# Patient Record
Sex: Female | Born: 1935 | Race: White | Hispanic: No
Health system: Southern US, Community
[De-identification: ages and names within clinical notes are randomized; demographics above are authoritative.]

## PROBLEM LIST (undated history)

## (undated) DIAGNOSIS — R011 Cardiac murmur, unspecified: Secondary | ICD-10-CM

## (undated) DIAGNOSIS — J45909 Unspecified asthma, uncomplicated: Secondary | ICD-10-CM

## (undated) DIAGNOSIS — Q453 Other congenital malformations of pancreas and pancreatic duct: Secondary | ICD-10-CM

## (undated) DIAGNOSIS — M810 Age-related osteoporosis without current pathological fracture: Secondary | ICD-10-CM

## (undated) DIAGNOSIS — K219 Gastro-esophageal reflux disease without esophagitis: Secondary | ICD-10-CM

## (undated) DIAGNOSIS — R112 Nausea with vomiting, unspecified: Secondary | ICD-10-CM

## (undated) DIAGNOSIS — T4145XA Adverse effect of unspecified anesthetic, initial encounter: Secondary | ICD-10-CM

## (undated) DIAGNOSIS — T8859XA Other complications of anesthesia, initial encounter: Secondary | ICD-10-CM

## (undated) DIAGNOSIS — H8110 Benign paroxysmal vertigo, unspecified ear: Secondary | ICD-10-CM

## (undated) DIAGNOSIS — E039 Hypothyroidism, unspecified: Secondary | ICD-10-CM

## (undated) DIAGNOSIS — I219 Acute myocardial infarction, unspecified: Secondary | ICD-10-CM

## (undated) DIAGNOSIS — E079 Disorder of thyroid, unspecified: Secondary | ICD-10-CM

## (undated) DIAGNOSIS — R197 Diarrhea, unspecified: Secondary | ICD-10-CM

## (undated) DIAGNOSIS — J449 Chronic obstructive pulmonary disease, unspecified: Secondary | ICD-10-CM

## (undated) DIAGNOSIS — Z9889 Other specified postprocedural states: Secondary | ICD-10-CM

## (undated) DIAGNOSIS — H269 Unspecified cataract: Secondary | ICD-10-CM

## (undated) DIAGNOSIS — R0600 Dyspnea, unspecified: Secondary | ICD-10-CM

## (undated) DIAGNOSIS — I341 Nonrheumatic mitral (valve) prolapse: Secondary | ICD-10-CM

## (undated) HISTORY — PX: OTHER SURGICAL HISTORY: SHX169

## (undated) HISTORY — PX: APPENDECTOMY: SHX54

## (undated) HISTORY — DX: Disorder of thyroid, unspecified: E07.9

## (undated) HISTORY — DX: Age-related osteoporosis without current pathological fracture: M81.0

## (undated) HISTORY — DX: Chronic obstructive pulmonary disease, unspecified: J44.9

## (undated) HISTORY — PX: LAPAROSCOPIC SALPINGO OOPHERECTOMY: SHX5927

## (undated) HISTORY — PX: TONSILLECTOMY: SUR1361

## (undated) HISTORY — DX: Diarrhea, unspecified: R19.7

## (undated) HISTORY — DX: Benign paroxysmal vertigo, unspecified ear: H81.10

## (undated) HISTORY — PX: FRACTURE SURGERY: SHX138

## (undated) HISTORY — DX: Gastro-esophageal reflux disease without esophagitis: K21.9

## (undated) HISTORY — DX: Unspecified cataract: H26.9

## (undated) HISTORY — PX: COLONOSCOPY: SHX174

## (undated) HISTORY — PX: TUBAL LIGATION: SHX77

## (undated) HISTORY — DX: Acute myocardial infarction, unspecified: I21.9

---

## 1995-04-07 DIAGNOSIS — I219 Acute myocardial infarction, unspecified: Secondary | ICD-10-CM

## 1995-04-07 HISTORY — DX: Acute myocardial infarction, unspecified: I21.9

## 1997-08-02 ENCOUNTER — Other Ambulatory Visit: Admission: RE | Admit: 1997-08-02 | Discharge: 1997-08-02 | Payer: Self-pay | Admitting: Obstetrics and Gynecology

## 1998-07-24 ENCOUNTER — Other Ambulatory Visit: Admission: RE | Admit: 1998-07-24 | Discharge: 1998-07-24 | Payer: Self-pay | Admitting: Obstetrics and Gynecology

## 1999-09-08 ENCOUNTER — Other Ambulatory Visit: Admission: RE | Admit: 1999-09-08 | Discharge: 1999-09-08 | Payer: Self-pay | Admitting: Obstetrics and Gynecology

## 2000-12-29 ENCOUNTER — Other Ambulatory Visit: Admission: RE | Admit: 2000-12-29 | Discharge: 2000-12-29 | Payer: Self-pay | Admitting: Obstetrics and Gynecology

## 2002-01-11 ENCOUNTER — Other Ambulatory Visit: Admission: RE | Admit: 2002-01-11 | Discharge: 2002-01-11 | Payer: Self-pay | Admitting: Obstetrics and Gynecology

## 2004-04-16 ENCOUNTER — Other Ambulatory Visit: Admission: RE | Admit: 2004-04-16 | Discharge: 2004-04-16 | Payer: Self-pay | Admitting: Obstetrics and Gynecology

## 2006-07-05 ENCOUNTER — Ambulatory Visit: Payer: Self-pay | Admitting: Unknown Physician Specialty

## 2008-09-13 ENCOUNTER — Ambulatory Visit: Payer: Self-pay | Admitting: Family Medicine

## 2010-03-21 ENCOUNTER — Ambulatory Visit: Payer: Self-pay | Admitting: Family Medicine

## 2012-07-21 ENCOUNTER — Ambulatory Visit (INDEPENDENT_AMBULATORY_CARE_PROVIDER_SITE_OTHER): Payer: Medicare Other | Admitting: Family Medicine

## 2012-07-21 ENCOUNTER — Encounter: Payer: Self-pay | Admitting: Family Medicine

## 2012-07-21 ENCOUNTER — Telehealth: Payer: Self-pay

## 2012-07-21 VITALS — BP 80/58 | HR 62 | Temp 99.1°F | Resp 16 | Ht 61.0 in | Wt 95.8 lb

## 2012-07-21 DIAGNOSIS — J309 Allergic rhinitis, unspecified: Secondary | ICD-10-CM

## 2012-07-21 DIAGNOSIS — E039 Hypothyroidism, unspecified: Secondary | ICD-10-CM

## 2012-07-21 DIAGNOSIS — R42 Dizziness and giddiness: Secondary | ICD-10-CM

## 2012-07-21 LAB — POCT UA - MICROSCOPIC ONLY: Crystals, Ur, HPF, POC: NEGATIVE

## 2012-07-21 LAB — POCT URINALYSIS DIPSTICK
Bilirubin, UA: NEGATIVE
Glucose, UA: NEGATIVE
Nitrite, UA: NEGATIVE
Spec Grav, UA: 1.01

## 2012-07-21 LAB — POCT CBC
Granulocyte percent: 53.8 %G (ref 37–80)
MCV: 85.9 fL (ref 80–97)
MID (cbc): 0.7 (ref 0–0.9)
MPV: 10.1 fL (ref 0–99.8)
POC Granulocyte: 4.1 (ref 2–6.9)
POC MID %: 8.7 %M (ref 0–12)
Platelet Count, POC: 313 10*3/uL (ref 142–424)
RBC: 5.03 M/uL (ref 4.04–5.48)

## 2012-07-21 LAB — GLUCOSE, POCT (MANUAL RESULT ENTRY): POC Glucose: 73 mg/dl (ref 70–99)

## 2012-07-21 MED ORDER — MECLIZINE HCL 12.5 MG PO TABS: 12.5000 mg | ORAL_TABLET | Freq: Three times a day (TID) | ORAL | Status: DC | PRN

## 2012-07-21 MED ORDER — FLUTICASONE PROPIONATE 50 MCG/ACT NA SUSP: 2.0000 | Freq: Every day | NASAL | Status: DC

## 2012-07-21 NOTE — Telephone Encounter (Signed)
Error

## 2012-07-21 NOTE — Progress Notes (Signed)
[address]   [place], [place]  [address]   [phone]  Subjective:    Patient ID: Shelia Fernandez, female    DOB: 04/05/1935, 78 y.o.   MRN: [id number]  HPI This 78 y.o. female presents for evaluation of dizziness.  Onset one week ago.  With sitting up out of bed, becomes severely dizzy.  Improves in one or two minutes after onset.  Got up to use the bathroom one week ago after dizziness resolved, everything went black so sat down in bathroom floor; may have passed out for a second.  Drinks milk and piece of chocolate with resolution of dizziness; worried about low blood sugar.  Previously evaluated by Franchot Erichsen for hypothyroidism.  Worried about low blood sugar. Last April gets this dizziness.  Allergic to oak trees; worried about inner ear due to allergies; not taking anything for allergic rhinitis; cannot take pseudoephedrine;all other allergy medications say not to take if has thyroid issues.  Thinks may be hypoglycemia.  No glucometer.  Minimal sweets; eats fruits and vegetables and proteins.  Rare pastas, breads.  Eats pastas sparingly.  Eats meat regularly.  Some nasal congestion; + rhinnorrhea; has two lobes of pancreas; both ducts are open. Denies HA, diplopia, acute vision changes, nausea, vomiting, unsteady gait, focal weakness, paresthesias, confusion, fever.  Has congenital malformation of pancreas; daughter worried about excessive insulin secretion leading to hypoglycemia.  No change in diet recently; usually eats every four hours.  No weight changes.  No medication changes.   Review of Systems  Constitutional: Negative for fever, chills, diaphoresis, activity change, appetite change, fatigue and unexpected weight change.  HENT: Positive for congestion, rhinorrhea, sneezing and postnasal drip. Negative for sore throat, drooling, mouth sores, trouble swallowing and voice change.   Eyes: Negative for photophobia and visual disturbance.  Respiratory: Positive for shortness of breath.  Negative for cough.   Cardiovascular: Positive for chest pain. Negative for palpitations and leg swelling.  Gastrointestinal: Positive for abdominal pain. Negative for nausea, vomiting, diarrhea and constipation.  Skin: Negative for color change, pallor and rash.  Neurological: Positive for dizziness, syncope and light-headedness. Negative for tremors, seizures, facial asymmetry, speech difficulty, weakness, numbness and headaches.  Psychiatric/Behavioral: Negative for confusion.        Past Medical History  Diagnosis Date  . Cataract   . COPD (chronic obstructive pulmonary disease)   . Osteoporosis   . Myocardial infarction   . Thyroid disease     Hypothyroidism    Past Surgical History  Procedure Laterality Date  . Fracture surgery    . Appendectomy    . Tubal ligation      Prior to Admission medications   Medication Sig Start Date End Date Taking? Authorizing Provider  albuterol-ipratropium (COMBIVENT) 18-103 MCG/ACT inhaler Inhale 2 puffs into the lungs every 6 (six) hours as needed for wheezing.   Yes Historical Provider, MD  thyroid (ARMOUR) 30 MG tablet Take 30 mg by mouth daily.   Yes Historical Provider, MD  fluticasone (FLONASE) 50 MCG/ACT nasal spray Place 2 sprays into the nose daily. 07/21/12   Ethelda Chick, MD  meclizine (ANTIVERT) 12.5 MG tablet Take 1 tablet (12.5 mg total) by mouth 3 (three) times daily as needed. 07/21/12   Ethelda Chick, MD    Allergies  Allergen Reactions  . Biaxin (Clarithromycin)   . Penicillins   . Sudafed (Pseudoephedrine Hcl)   . Sulfa Antibiotics     History   Social History  . Marital  Status: Married    Spouse Name: N/A    Number of Children: N/A  . Years of Education: N/A   Occupational History  . Not on file.   Social History Main Topics  . Smoking status: Never Smoker   . Smokeless tobacco: Not on file  . Alcohol Use: No  . Drug Use: No  . Sexually Active: Not on file   Other Topics Concern  . Not on file    Social History Narrative  . No narrative on file    Family History  Problem Relation Age of Onset  . Cancer Sister   . Cancer Sister     Objective:   Physical Exam  Nursing note and vitals reviewed. Constitutional: She is oriented to person, place, and time. She appears well-developed and well-nourished. No distress.  HENT:  Head: Normocephalic and atraumatic.  Right Ear: External ear normal.  Left Ear: External ear normal.  Nose: Nose normal.  Mouth/Throat: Oropharynx is clear and moist.  Eyes: Conjunctivae and EOM are normal. Pupils are equal, round, and reactive to light.  Neck: Normal range of motion. Neck supple. No JVD present. No thyromegaly present.  Cardiovascular: Normal rate, regular rhythm and normal heart sounds.  Exam reveals no gallop and no friction rub.   No murmur heard. Pulmonary/Chest: Effort normal and breath sounds normal. No respiratory distress. She has no wheezes. She has no rales.  Abdominal: Soft. Bowel sounds are normal. She exhibits no distension. There is tenderness in the left lower quadrant. There is no rebound and no guarding.  Lymphadenopathy:    She has no cervical adenopathy.  Neurological: She is alert and oriented to person, place, and time. She has normal reflexes. No cranial nerve deficit. She exhibits normal muscle tone. Coordination normal.  Acute onset of dizziness with position change (supine to sitting).  Skin: Skin is warm and dry. No rash noted. She is not diaphoretic. No erythema. No pallor.  Psychiatric: She has a normal mood and affect. Her behavior is normal. Judgment and thought content normal.   EKG: NSR; no acute changes.  Results for orders placed in visit on 07/21/12  POCT CBC      Result Value Range   WBC 7.7  4.6 - 10.2 K/uL   Lymph, poc 2.9  0.6 - 3.4   POC LYMPH PERCENT 37.5  10 - 50 %L   MID (cbc) 0.7  0 - 0.9   POC MID % 8.7  0 - 12 %M   POC Granulocyte 4.1  2 - 6.9   Granulocyte percent 53.8  37 - 80 %G    RBC 5.03  4.04 - 5.48 M/uL   Hemoglobin 13.7  12.2 - 16.2 g/dL   HCT, POC 16.1  09.6 - 47.9 %   MCV 85.9  80 - 97 fL   MCH, POC 27.2  27 - 31.2 pg   MCHC 31.7 (*) 31.8 - 35.4 g/dL   RDW, POC 04.5     Platelet Count, POC 313  142 - 424 K/uL   MPV 10.1  0 - 99.8 fL  GLUCOSE, POCT (MANUAL RESULT ENTRY)      Result Value Range   POC Glucose 73  70 - 99 mg/dl  POCT URINALYSIS DIPSTICK      Result Value Range   Color, UA yellow     Clarity, UA clear     Glucose, UA neg     Bilirubin, UA neg     Ketones, UA neg  Spec Grav, UA 1.010     Blood, UA trace     pH, UA 6.0     Protein, UA neg     Urobilinogen, UA 0.2     Nitrite, UA neg     Leukocytes, UA Trace    POCT UA - MICROSCOPIC ONLY      Result Value Range   WBC, Ur, HPF, POC 3-4     RBC, urine, microscopic 1-2     Bacteria, U Microscopic trace     Mucus, UA neg     Epithelial cells, urine per micros 0-1     Crystals, Ur, HPF, POC neg     Casts, Ur, LPF, POC neg     Yeast, UA neg         Assessment & Plan:  Dizziness - Plan: EKG 12-Lead, POCT CBC, POCT glucose (manual entry), Comprehensive metabolic panel, TSH, T4, free, Magnesium, POCT urinalysis dipstick, POCT UA - Microscopic Only  Unspecified hypothyroidism  Allergic rhinitis  1.  Dizziness:  New.  Associated with position changes.  Ddx includes vertigo, labrynthitis, orthostatic hypotension.  Obtain labs. Normal neurological exam; stable EKG.  Rx for Meclizine provided for PRN use. Treat allergic rhinitis to see if improves dizziness. Glucometer provided to check blood sugar with acute onset of dizziness; drinking milk helps with dizziness by report.  May warrant cardiology evaluation to evaluate for orthostatic hypotension in patient with chronically low blood pressure; encourage increased salt intake and increased fluid intake.  Congenital malformation of pancreas; thus, question excessive secretion of insulin leading to hypoglycemia; to check blood sugars  regularly. 2.  Hypothyroidism: stable; obtain labs.  Continue current dose of medication. 3.  Allergic Rhinitis: worsening.  Rx for Flonase provided to use daily.  Dizziness occurred same time last year; question if dizziness related to allergic rhinitis.    Meds ordered this encounter  Medications  . albuterol-ipratropium (COMBIVENT) 18-103 MCG/ACT inhaler    Sig: Inhale 2 puffs into the lungs every 6 (six) hours as needed for wheezing.  . thyroid (ARMOUR) 30 MG tablet    Sig: Take 30 mg by mouth daily.  . fluticasone (FLONASE) 50 MCG/ACT nasal spray    Sig: Place 2 sprays into the nose daily.    Dispense:  16 g    Refill:  6  . meclizine (ANTIVERT) 12.5 MG tablet    Sig: Take 1 tablet (12.5 mg total) by mouth 3 (three) times daily as needed.    Dispense:  30 tablet    Refill:  0

## 2012-07-21 NOTE — Patient Instructions (Addendum)
Dizziness - Plan: EKG 12-Lead, POCT CBC, POCT glucose (manual entry), Comprehensive metabolic panel, TSH, T4, free, Magnesium, POCT urinalysis dipstick, POCT UA - Microscopic Only, meclizine (ANTIVERT) 12.5 MG tablet  Unspecified hypothyroidism  Allergic rhinitis - Plan: fluticasone (FLONASE) 50 MCG/ACT nasal spray

## 2012-07-22 LAB — COMPREHENSIVE METABOLIC PANEL
AST: 26 U/L (ref 0–37)
Albumin: 4.5 g/dL (ref 3.5–5.2)
Alkaline Phosphatase: 62 U/L (ref 39–117)
Potassium: 4.1 mEq/L (ref 3.5–5.3)
Sodium: 140 mEq/L (ref 135–145)
Total Protein: 7.5 g/dL (ref 6.0–8.3)

## 2012-07-29 ENCOUNTER — Encounter: Payer: Self-pay | Admitting: Family Medicine

## 2012-08-12 ENCOUNTER — Telehealth: Payer: Self-pay

## 2012-08-12 MED ORDER — THYROID 30 MG PO TABS: 30.0000 mg | ORAL_TABLET | Freq: Every day | ORAL | Status: DC

## 2012-08-12 NOTE — Telephone Encounter (Signed)
[facility] is calling in regards to a refill request they faxed over for armour. They state that they were unable to send it over electronically due to issues with the original provider.  [phone]# [phone]

## 2012-08-12 NOTE — Telephone Encounter (Signed)
Have faxed for her.

## 2012-11-09 ENCOUNTER — Other Ambulatory Visit: Payer: Self-pay

## 2013-01-08 ENCOUNTER — Inpatient Hospital Stay: Payer: Self-pay | Admitting: Internal Medicine

## 2013-01-08 ENCOUNTER — Ambulatory Visit: Payer: Self-pay | Admitting: Orthopedic Surgery

## 2013-01-08 LAB — URINALYSIS, COMPLETE
Bacteria: NONE SEEN
Bilirubin,UR: NEGATIVE
Glucose,UR: NEGATIVE mg/dL (ref 0–75)
Leukocyte Esterase: NEGATIVE
Ph: 5 (ref 4.5–8.0)
Protein: NEGATIVE
RBC,UR: 1 /HPF (ref 0–5)
Specific Gravity: 1.017 (ref 1.003–1.030)
Squamous Epithelial: NONE SEEN
WBC UR: <1 /HPF (ref 0–5)

## 2013-01-08 LAB — CBC
HCT: 39.3 % (ref 35.0–47.0)
HGB: 13.1 g/dL (ref 12.0–16.0)
MCHC: 33.3 g/dL (ref 32.0–36.0)
MCV: 82 fL (ref 80–100)
Platelet: 242 10*3/uL (ref 150–440)
RBC: 4.78 10*6/uL (ref 3.80–5.20)
WBC: 10.3 10*3/uL (ref 3.6–11.0)

## 2013-01-08 LAB — APTT: Activated PTT: 29.2 secs (ref 23.6–35.9)

## 2013-01-08 LAB — COMPREHENSIVE METABOLIC PANEL
Albumin: 3.8 g/dL (ref 3.4–5.0)
Alkaline Phosphatase: 78 U/L (ref 50–136)
Bilirubin,Total: 0.4 mg/dL (ref 0.2–1.0)
Calcium, Total: 9.2 mg/dL (ref 8.5–10.1)
Co2: 25 mmol/L (ref 21–32)
Creatinine: 0.74 mg/dL (ref 0.60–1.30)
EGFR (African American): >60
Glucose: 104 mg/dL — ABNORMAL HIGH (ref 65–99)
SGOT(AST): 30 U/L (ref 15–37)
SGPT (ALT): 26 U/L (ref 12–78)

## 2013-01-08 LAB — PROTIME-INR
INR: 1
Prothrombin Time: 13.3 secs (ref 11.5–14.7)

## 2013-01-09 LAB — BASIC METABOLIC PANEL
Calcium, Total: 8.4 mg/dL — ABNORMAL LOW (ref 8.5–10.1)
Chloride: 106 mmol/L (ref 98–107)
Co2: 26 mmol/L (ref 21–32)
EGFR (African American): >60
EGFR (Non-African Amer.): >60
Glucose: 106 mg/dL — ABNORMAL HIGH (ref 65–99)
Osmolality: 278 (ref 275–301)
Potassium: 3.9 mmol/L (ref 3.5–5.1)
Sodium: 138 mmol/L (ref 136–145)

## 2013-01-09 LAB — CBC WITH DIFFERENTIAL/PLATELET
Basophil #: 0 10*3/uL (ref 0.0–0.1)
Eosinophil #: 0 10*3/uL (ref 0.0–0.7)
Eosinophil %: 0.4 %
HCT: 35.1 % (ref 35.0–47.0)
MCHC: 33.7 g/dL (ref 32.0–36.0)
MCV: 82 fL (ref 80–100)
Monocyte #: 0.7 x10 3/mm (ref 0.2–0.9)
Platelet: 198 10*3/uL (ref 150–440)
RBC: 4.28 10*6/uL (ref 3.80–5.20)
WBC: 9 10*3/uL (ref 3.6–11.0)

## 2013-01-10 LAB — BASIC METABOLIC PANEL
Anion Gap: 11 (ref 7–16)
Calcium, Total: 8.2 mg/dL — ABNORMAL LOW (ref 8.5–10.1)
Chloride: 105 mmol/L (ref 98–107)
Co2: 21 mmol/L (ref 21–32)
Creatinine: 0.71 mg/dL (ref 0.60–1.30)
Glucose: 63 mg/dL — ABNORMAL LOW (ref 65–99)
Osmolality: 272 (ref 275–301)
Potassium: 4 mmol/L (ref 3.5–5.1)

## 2013-01-10 LAB — HEMOGLOBIN: HGB: 10.8 g/dL — ABNORMAL LOW (ref 12.0–16.0)

## 2013-01-11 LAB — BASIC METABOLIC PANEL
Anion Gap: 7 (ref 7–16)
BUN: 10 mg/dL (ref 7–18)
Calcium, Total: 7.7 mg/dL — ABNORMAL LOW (ref 8.5–10.1)
Chloride: 108 mmol/L — ABNORMAL HIGH (ref 98–107)
Co2: 24 mmol/L (ref 21–32)
Creatinine: 0.64 mg/dL (ref 0.60–1.30)
Glucose: 99 mg/dL (ref 65–99)

## 2013-01-11 LAB — HEMOGLOBIN: HGB: 8.8 g/dL — ABNORMAL LOW (ref 12.0–16.0)

## 2013-01-12 LAB — CBC WITH DIFFERENTIAL/PLATELET
Basophil %: 0.6 %
Eosinophil %: 1.6 %
HGB: 9.2 g/dL — ABNORMAL LOW (ref 12.0–16.0)
Lymphocyte #: 2 10*3/uL (ref 1.0–3.6)
Lymphocyte %: 25.3 %
MCH: 28.3 pg (ref 26.0–34.0)
MCHC: 34.5 g/dL (ref 32.0–36.0)
MCV: 82 fL (ref 80–100)
Monocyte %: 11.6 %
Platelet: 176 10*3/uL (ref 150–440)
RBC: 3.24 10*6/uL — ABNORMAL LOW (ref 3.80–5.20)
RDW: 13.7 % (ref 11.5–14.5)
WBC: 8.1 10*3/uL (ref 3.6–11.0)

## 2013-01-13 ENCOUNTER — Encounter: Payer: Self-pay | Admitting: Internal Medicine

## 2013-01-23 ENCOUNTER — Ambulatory Visit: Payer: Self-pay | Admitting: Gerontology

## 2013-02-09 ENCOUNTER — Other Ambulatory Visit: Payer: Self-pay

## 2013-02-20 ENCOUNTER — Ambulatory Visit: Payer: Self-pay | Admitting: General Practice

## 2013-04-23 ENCOUNTER — Ambulatory Visit (INDEPENDENT_AMBULATORY_CARE_PROVIDER_SITE_OTHER): Payer: Medicare Other | Admitting: Family Medicine

## 2013-04-23 VITALS — BP 110/52 | HR 80 | Temp 98.5°F | Resp 16 | Ht 61.0 in | Wt 95.2 lb

## 2013-04-23 DIAGNOSIS — J449 Chronic obstructive pulmonary disease, unspecified: Secondary | ICD-10-CM

## 2013-04-23 DIAGNOSIS — M810 Age-related osteoporosis without current pathological fracture: Secondary | ICD-10-CM

## 2013-04-23 DIAGNOSIS — K219 Gastro-esophageal reflux disease without esophagitis: Secondary | ICD-10-CM

## 2013-04-23 DIAGNOSIS — E039 Hypothyroidism, unspecified: Secondary | ICD-10-CM

## 2013-04-23 LAB — POCT CBC
Granulocyte percent: 63.7 %G (ref 37–80)
HEMATOCRIT: 43.3 % (ref 37.7–47.9)
HEMOGLOBIN: 13.1 g/dL (ref 12.2–16.2)
LYMPH, POC: 1.9 (ref 0.6–3.4)
MCH: 25.6 pg — AB (ref 27–31.2)
MCHC: 30.3 g/dL — AB (ref 31.8–35.4)
MCV: 84.7 fL (ref 80–97)
MID (cbc): 0.5 (ref 0–0.9)
MPV: 10.1 fL (ref 0–99.8)
POC Granulocyte: 4.3 (ref 2–6.9)
POC LYMPH PERCENT: 28.3 %L (ref 10–50)
POC MID %: 8 %M (ref 0–12)
Platelet Count, POC: 267 10*3/uL (ref 142–424)
RBC: 5.11 M/uL (ref 4.04–5.48)
RDW, POC: 14.7 %
WBC: 6.8 10*3/uL (ref 4.6–10.2)

## 2013-04-23 MED ORDER — THYROID 30 MG PO TABS: 30.0000 mg | ORAL_TABLET | Freq: Every day | ORAL | Status: DC

## 2013-04-23 MED ORDER — PANTOPRAZOLE SODIUM 40 MG PO TBEC: 40.0000 mg | DELAYED_RELEASE_TABLET | Freq: Every day | ORAL | Status: DC

## 2013-04-23 NOTE — Progress Notes (Signed)
Subjective:    Patient ID: Shelia Fernandez, female    DOB: 10-19-1934, 79 y.o.   MRN: [id number]  HPI This chart was scribed for [name], MD by Thea Alken, ED Scribe. This patient was seen in room 5 and the patient's care was started at 3:25 PM.  HPI Comments: Shelia Fernandez is a 79 y.o. female who presents to the [facility] for a medication refill on Armour Thyroid for hypothyroidism.   Osteoporosis:  She reports she has been doing fine except for her left fractured hip. She she reports that her left hip buckled on her. Pt reports that she does not like the rod that was put in her left leg and that is sticks her. She reports the rod was put in in by Dr. Jannet Mantis.  She reports that she broke her left hip October 5th ,2014; she was hospitalized for 3 days and then went to rehab for about a month. Pt has h/o breaking her wrist in 2001 while washing dishes. She has also suffered R hip fracture in past as well.  Took Fosamax for years; also took Forteo twice for three years each time.  Osteoporosis is managed by Dr. Radene Knee of gynecology; he orders bone density scans regularly; pt has lactose intolerance.    Dizziness:  She reports that her dizziness has been cured by Dr. Delight Ovens. She states she has had dizziness her entire life and remembers as a teen her mother taking her to the doctor.   She was evaluated by Dr. Pryor Ochoa after visit at [facility] in 07/2012; dizziness continued despite PRN meclizine.  Dr. Pryor Ochoa diagnosed pt with BPV; placed patient on Eply maneuver bed in office; patient suffered with severe dizziness during procedure but dizziness then resolved.  No recurrence.  To follow-up with Dr. Pryor Ochoa PRN.  Very pleased with results.  She states her mother died 45 y.o. from cervical cancer and dad died from kidneys disease. Her father was a heavy smoker. She reports she has 4 sisters, 2 with breast cancer. She states one sister is 39 and got breast cancer in 2000, and her  other sister is 83. Neither sister have children. She has a sister with Lupus and is being seen at [facility].   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   COPD:  She has COPD and see the pulmonologist  twice a year. Pt reports she is not a smoker but her husband was a heavy smoker. She reports that asthma runs in her family and that she wheezes. She uses her inhaler about 4 times a day.  She recieved a flu shot 3 months ago. She received pnuemonia shot within 7-8 years. [place] vaccine in 2008. TDAP in 2008.   GERD: placed on Protonix during rehab; Protonix works much better for patient's intermittent reflux than Prilosec and Nexium; requesting refill of Protonix.    Past Medical History  Diagnosis Date  . Cataract   . COPD (chronic obstructive pulmonary disease)   . Osteoporosis   . Myocardial infarction   . Thyroid disease     Hypothyroidism   Past Surgical History  Procedure Laterality Date  . Appendectomy    . Tubal ligation    . Fracture surgery      R hip fracture s/p ORIF; L hip fracture s/p ORIF.  [facility] consult      +BPV;  Eply maneuvers wtih improvement.  [facility]/ENT.  . Colonoscopy      x 2; failure due to spasms.  [facility].    Allergies  Allergen Reactions  . Biaxin [Clarithromycin]   . Penicillins   . Sudafed [Pseudoephedrine Hcl]   . Sulfa Antibiotics    Prior to Admission medications   Medication Sig Start Date End Date Taking? Authorizing Provider  albuterol-ipratropium (COMBIVENT) 18-103 MCG/ACT inhaler Inhale 2 puffs into the lungs every 6 (six) hours as needed for wheezing.   Yes Historical Provider, MD  thyroid (ARMOUR) 30 MG tablet Take 1 tablet (30 mg total) by mouth daily. 08/12/12  Yes Wardell Honour, MD  fluticasone (FLONASE) 50 MCG/ACT nasal spray Place 2 sprays into the nose daily. 07/21/12   Wardell Honour, MD  meclizine (ANTIVERT) 12.5 MG tablet Take 1 tablet  (12.5 mg total) by mouth 3 (three) times daily as needed. 07/21/12   Wardell Honour, MD    Review of Systems  Constitutional: Negative for fever, chills, diaphoresis and fatigue.  Respiratory: Positive for wheezing. Negative for cough, shortness of breath and stridor.   Cardiovascular: Negative for chest pain, palpitations and leg swelling.  Gastrointestinal: Negative for nausea, vomiting, abdominal pain and diarrhea.  Endocrine: Negative for cold intolerance and heat intolerance.  Musculoskeletal: Negative for back pain, gait problem, joint swelling and myalgias.  Neurological: Negative for dizziness, syncope, speech difficulty, weakness, light-headedness, numbness and headaches.       Objective:   Physical Exam  Nursing note and vitals reviewed. Constitutional: She is oriented to person, place, and time. She appears well-developed and well-nourished. No distress.  HENT:  Head: Normocephalic and atraumatic.  Right Ear: External ear normal.  Left Ear: External ear normal.  Nose: Nose normal.  Mouth/Throat: Oropharynx is clear and moist.  Eyes: Conjunctivae and EOM are normal. Pupils are equal, round, and reactive to light.  Neck: Neck supple. No tracheal deviation present.  Cardiovascular: Normal rate, regular rhythm, normal heart sounds and intact distal pulses.  Exam reveals no gallop and no friction rub.   No murmur heard. Pulmonary/Chest: Effort normal. No respiratory distress. She has no wheezes. She has no rales.  Abdominal: Soft. Bowel sounds are normal. She exhibits no distension and no mass. There is no tenderness. There is no rebound and no guarding.  Musculoskeletal: Normal range of motion.  Lymphadenopathy:    She has no cervical adenopathy.  Neurological: She is alert and oriented to person, place, and time.  Skin: Skin is warm and dry.  Psychiatric: She has a normal mood and affect. Her behavior is normal.       Assessment & Plan:   1. Osteoporosis, unspecified     2. COPD (chronic obstructive pulmonary disease)   3. Unspecified hypothyroidism   4. Esophageal reflux    1. Osteoporosis: Persistent; s/p second hip fracture; s/p bisphosphonates x 10 years in the past; s/p Forteo in the past.  Recommend discussing Reclast with Dr. Radene Knee.   2.  Hypothyroidism:  Stable; obtain labs; refills for one year provided. 3.  GERD:  New.  Controlled with Protonix; rx provided. 4.  COPD: stable; followed by pulmonologist; s/p flu vaccine in fall.   Meds ordered this encounter  Medications  . thyroid (ARMOUR) 30 MG tablet    Sig: Take 1 tablet (30 mg total) by mouth daily.    Dispense:  90 tablet    Refill:  3  . pantoprazole (PROTONIX) 40 MG tablet  Sig: Take 1 tablet (40 mg total) by mouth daily.    Dispense:  90 tablet    Refill:  3    I personally performed the services described in this documentation, which was scribed in my presence.  The recorded information has been reviewed and is accurate.  [name], M.D.  [facility] [address] [place], [place]  [address] [phone] phone [phone] fax

## 2013-04-24 DIAGNOSIS — J449 Chronic obstructive pulmonary disease, unspecified: Secondary | ICD-10-CM | POA: Insufficient documentation

## 2013-04-24 DIAGNOSIS — M810 Age-related osteoporosis without current pathological fracture: Secondary | ICD-10-CM | POA: Insufficient documentation

## 2013-04-24 DIAGNOSIS — E039 Hypothyroidism, unspecified: Secondary | ICD-10-CM | POA: Insufficient documentation

## 2013-04-24 DIAGNOSIS — K219 Gastro-esophageal reflux disease without esophagitis: Secondary | ICD-10-CM | POA: Insufficient documentation

## 2013-04-24 LAB — T4, FREE: Free T4: 1.1 ng/dL (ref 0.80–1.80)

## 2013-04-24 LAB — TSH: TSH: 0.395 u[IU]/mL (ref 0.350–4.500)

## 2013-04-29 ENCOUNTER — Encounter: Payer: Self-pay | Admitting: Family Medicine

## 2013-09-03 ENCOUNTER — Ambulatory Visit (INDEPENDENT_AMBULATORY_CARE_PROVIDER_SITE_OTHER): Payer: Medicare Other | Admitting: Family Medicine

## 2013-09-03 VITALS — BP 98/68 | HR 77 | Temp 97.4°F | Ht 61.5 in | Wt 95.0 lb

## 2013-09-03 DIAGNOSIS — E039 Hypothyroidism, unspecified: Secondary | ICD-10-CM

## 2013-09-03 DIAGNOSIS — E876 Hypokalemia: Secondary | ICD-10-CM

## 2013-09-03 DIAGNOSIS — R5383 Other fatigue: Principal | ICD-10-CM

## 2013-09-03 DIAGNOSIS — R5381 Other malaise: Secondary | ICD-10-CM

## 2013-09-03 DIAGNOSIS — R197 Diarrhea, unspecified: Secondary | ICD-10-CM

## 2013-09-03 DIAGNOSIS — R300 Dysuria: Secondary | ICD-10-CM

## 2013-09-03 DIAGNOSIS — R1011 Right upper quadrant pain: Secondary | ICD-10-CM

## 2013-09-03 DIAGNOSIS — M7989 Other specified soft tissue disorders: Secondary | ICD-10-CM

## 2013-09-03 DIAGNOSIS — D649 Anemia, unspecified: Secondary | ICD-10-CM

## 2013-09-03 LAB — POCT CBC
Granulocyte percent: 68.1 %G (ref 37–80)
HCT, POC: 39.1 % (ref 37.7–47.9)
Hemoglobin: 12.6 g/dL (ref 12.2–16.2)
Lymph, poc: 2.3 (ref 0.6–3.4)
MCH: 27 pg (ref 27–31.2)
MCHC: 32.2 g/dL (ref 31.8–35.4)
MCV: 83.8 fL (ref 80–97)
MID (CBC): 0.7 (ref 0–0.9)
MPV: 9.1 fL (ref 0–99.8)
PLATELET COUNT, POC: 364 10*3/uL (ref 142–424)
POC Granulocyte: 6.3 (ref 2–6.9)
POC LYMPH PERCENT: 24.8 %L (ref 10–50)
POC MID %: 7.1 % (ref 0–12)
RBC: 4.66 M/uL (ref 4.04–5.48)
RDW, POC: 13.5 %
WBC: 9.2 10*3/uL (ref 4.6–10.2)

## 2013-09-03 LAB — POCT URINALYSIS DIPSTICK
Glucose, UA: NEGATIVE
KETONES UA: 15
Leukocytes, UA: NEGATIVE
Nitrite, UA: NEGATIVE
PH UA: 5.5
PROTEIN UA: NEGATIVE
SPEC GRAV UA: 1.015
Urobilinogen, UA: 0.2

## 2013-09-03 LAB — POCT UA - MICROSCOPIC ONLY
CRYSTALS, UR, HPF, POC: NEGATIVE
Casts, Ur, LPF, POC: NEGATIVE
Yeast, UA: NEGATIVE

## 2013-09-03 LAB — COMPREHENSIVE METABOLIC PANEL
ALT: 21 U/L (ref 0–35)
AST: 21 U/L (ref 0–37)
Albumin: 4.1 g/dL (ref 3.5–5.2)
Alkaline Phosphatase: 49 U/L (ref 39–117)
BILIRUBIN TOTAL: 0.5 mg/dL (ref 0.2–1.2)
BUN: 16 mg/dL (ref 6–23)
CO2: 27 meq/L (ref 19–32)
CREATININE: 0.66 mg/dL (ref 0.50–1.10)
Calcium: 9.5 mg/dL (ref 8.4–10.5)
Chloride: 100 mEq/L (ref 96–112)
Glucose, Bld: 100 mg/dL — ABNORMAL HIGH (ref 70–99)
Potassium: 4.3 mEq/L (ref 3.5–5.3)
Sodium: 137 mEq/L (ref 135–145)
Total Protein: 7.4 g/dL (ref 6.0–8.3)

## 2013-09-03 LAB — AMYLASE: Amylase: 51 U/L (ref 0–105)

## 2013-09-03 LAB — LIPASE: Lipase: 32 U/L (ref 0–75)

## 2013-09-03 LAB — TSH: TSH: 0.551 u[IU]/mL (ref 0.350–4.500)

## 2013-09-03 LAB — MAGNESIUM: MAGNESIUM: 1.9 mg/dL (ref 1.5–2.5)

## 2013-09-03 MED ORDER — POTASSIUM CHLORIDE CRYS ER 20 MEQ PO TBCR: 20.0000 meq | EXTENDED_RELEASE_TABLET | Freq: Every day | ORAL | Status: DC

## 2013-09-03 NOTE — Progress Notes (Signed)
Subjective:    Patient ID: Shelia Fernandez, female    DOB: 09-10-1934, 79 y.o.   MRN: [id number]  09/03/2013  Diarrhea   Diarrhea  Associated symptoms include abdominal pain and a fever. Pertinent negatives include no chills or vomiting.   This 79 y.o. female presents for evaluation of diarrhea.  Onset of diarrhea two weeks ago.  Evaluated by [facility] ED one week ago. S/p CT which showed diffuse infection in the colon; also diagnosed with UTI; placed on Cipro.  Felt viral etiology.  Returned on Thursday because of worsening; repeated blood work; Hgb dropped 13.0 to 11.0.  Prescribed Metronidazole but did not fill; did obtain stool studies for C. Difficile and Stool Culture; no results yet.  +fever at night; Tmax 100.8.  Fever for two weeks; +sweats.  No n/v.  +weakness.  +diarrhea has improved recently; started improving two days ago; now having 2-3 stools per day and less watery.   RUQ pain.  No diverticulitis.  Gallbladder and pancreas normal.  Potassium low. transverse colitis.  Unable to eat.  Trying to drink water; drinking 1 bottle of water per day. Has been dizzy lately for past two days. Ankles have been swelling for two months; known L atrial abnormality.  History of intermittent swelling.  Potassium 3.3 and 3.4.  Received KCl orally and iv during recent visits.  No hemoccult obtained during recent ED visit; nurse discussed hemoccult but was not obtained.  +bloated; +frequent flatus.  Pink tinged stools one week ago which prompted ED visit.  Last colonoscopy unsuccesful.   Review of Systems  Constitutional: Positive for fever and diaphoresis. Negative for chills and fatigue.  Gastrointestinal: Positive for nausea, abdominal pain, diarrhea, blood in stool and abdominal distention. Negative for vomiting and constipation.  Genitourinary: Negative for dysuria.  Neurological: Positive for dizziness, weakness and light-headedness.    Past Medical History  Diagnosis Date  . Cataract   . COPD  (chronic obstructive pulmonary disease)   . Myocardial infarction   . Thyroid disease     Hypothyroidism  . Osteoporosis     R hip fracture; L hip fracture.  Intolerance to bisphosphonates; GI upset.  Took bisphosphonates x 10 years; last bisphosphonates 2000.  s/p L wrist fracture 2001.  Marland Kitchen Benign positional vertigo     s/p ENT consultation with Eply maneuvers 2014.  [facility].  Marland Kitchen GERD (gastroesophageal reflux disease)   . Diarrhea    Past Surgical History  Procedure Laterality Date  . Appendectomy    . Tubal ligation    . Fracture surgery      R hip fracture s/p ORIF; L hip fracture s/p ORIF.  Marland Kitchen Ent consult      +BPV; Eply maneuvers wtih improvement.  [facility].  . Colonoscopy      x 2; failure due to spasms.  [facility]. [facility].    Allergies  Allergen Reactions  . Biaxin [Clarithromycin]   . Penicillins   . Sudafed [Pseudoephedrine Hcl]   . Sulfa Antibiotics    Current Outpatient Prescriptions  Medication Sig Dispense Refill  . albuterol-ipratropium (COMBIVENT) 18-103 MCG/ACT inhaler Inhale 2 puffs into the lungs every 6 (six) hours as needed for wheezing.      . pantoprazole (PROTONIX) 40 MG tablet Take 1 tablet (40 mg total) by mouth daily.  90 tablet  3  . thyroid (ARMOUR) 30 MG tablet Take 1 tablet (30 mg total) by mouth daily.  90 tablet  3  . potassium chloride SA (K-DUR,KLOR-CON) 20 MEQ  tablet Take 1 tablet (20 mEq total) by mouth daily.  7 tablet  0   No current facility-administered medications for this visit.   History   Social History  . Marital Status: Married    Spouse Name: N/A    Number of Children: N/A  . Years of Education: N/A   Occupational History  . Not on file.   Social History Main Topics  . Smoking status: Never Smoker   . Smokeless tobacco: Not on file  . Alcohol Use: No  . Drug Use: No  . Sexual Activity: Not on file   Other Topics Concern  . Not on file   Social History Narrative   Marital status: widowed x 1997 after 30+ years        Children:  1 daughter; 2 granddaughters      Lives:  With daughter Shelia Fernandez) in basement      Tobacco: never; husband smoked      Alcohol:      Exercise:         Objective:    BP 98/68  Pulse 77  Temp(Src) 97.4 F (36.3 C) (Oral)  Ht 5' 1.5" (1.562 m)  Wt 95 lb (43.092 kg)  BMI 17.66 kg/m2  SpO2 100% Physical Exam  Nursing note and vitals reviewed. Constitutional: She is oriented to person, place, and time. She appears well-developed and well-nourished. No distress.  HENT:  Head: Normocephalic and atraumatic.  Eyes: Conjunctivae are normal. Pupils are equal, round, and reactive to light.  Neck: Normal range of motion. Neck supple.  Cardiovascular: Normal rate, regular rhythm and normal heart sounds.  Exam reveals no gallop and no friction rub.   No murmur heard. Trace pitting edema B ankles only.   Pulmonary/Chest: Effort normal and breath sounds normal. She has no wheezes. She has no rales.  Abdominal: Soft. Bowel sounds are normal. She exhibits no distension and no mass. There is no hepatosplenomegaly. There is tenderness in the right upper quadrant. There is no rebound, no guarding and no CVA tenderness. No hernia.  Lymphadenopathy:    She has no cervical adenopathy.  Neurological: She is alert and oriented to person, place, and time.  Skin: She is not diaphoretic.  Psychiatric: She has a normal mood and affect. Her behavior is normal.   Results for orders placed in visit on 09/03/13  POCT UA - MICROSCOPIC ONLY      Result Value Ref Range   WBC, Ur, HPF, POC 0-3     RBC, urine, microscopic 1-5     Bacteria, U Microscopic trace     Mucus, UA large     Epithelial cells, urine per micros 0-2     Crystals, Ur, HPF, POC neg     Casts, Ur, LPF, POC neg     Yeast, UA neg    POCT URINALYSIS DIPSTICK      Result Value Ref Range   Color, UA yellow     Clarity, UA clear     Glucose, UA neg     Bilirubin, UA small     Ketones, UA 15     Spec Grav, UA 1.015      Blood, UA trace-lysed     pH, UA 5.5     Protein, UA neg     Urobilinogen, UA 0.2     Nitrite, UA neg     Leukocytes, UA Negative    POCT CBC      Result Value Ref Range   WBC 9.2  4.6 - 10.2 K/uL   Lymph, poc 2.3  0.6 - 3.4   POC LYMPH PERCENT 24.8  10 - 50 %L   MID (cbc) 0.7  0 - 0.9   POC MID % 7.1  0 - 12 %M   POC Granulocyte 6.3  2 - 6.9   Granulocyte percent 68.1  37 - 80 %G   RBC 4.66  4.04 - 5.48 M/uL   Hemoglobin 12.6  12.2 - 16.2 g/dL   HCT, POC 39.1  37.7 - 47.9 %   MCV 83.8  80 - 97 fL   MCH, POC 27.0  27 - 31.2 pg   MCHC 32.2  31.8 - 35.4 g/dL   RDW, POC 13.5     Platelet Count, POC 364  142 - 424 K/uL   MPV 9.1  0 - 99.8 fL       Assessment & Plan:  Other malaise and fatigue - Plan: POCT UA - Microscopic Only, POCT urinalysis dipstick, POCT CBC, Amylase, Lipase, Magnesium, Comprehensive metabolic panel  Dysuria - Plan: POCT UA - Microscopic Only, POCT urinalysis dipstick, POCT CBC, Amylase, Lipase, Magnesium, Comprehensive metabolic panel  Diarrhea  Abdominal pain, right upper quadrant  Hypokalemia  Anemia  Leg swelling  1. Diarrhea/colitis:  New to this provider.  S/p [facility] ED visits x 2; s/p CT abd/pelvis; stool studies obtained in ED and pending; clinically improving; s/p Cipro. Did not fill Flagyl.  BRAT diet, hydration.   2. Hypokalemia: worsening due to diarrheal illness; rx for KCL 10meq daily provided; obtain labs, Mg. 3.  Abdominal pain RUQ:  New. Associated with transverse colitis by CT scan; +diarrhea illness; no g/r.   4.  Anemia:  New; improved H/H in office today.  RTC for bloody stools. 5.  Leg swelling: chronic with recent worsening; no associated CP/SOB/orthopnea. If worsens, recommend LE dopplers and repeat echo.   Meds ordered this encounter  Medications  . potassium chloride SA (K-DUR,KLOR-CON) 20 MEQ tablet    Sig: Take 1 tablet (20 mEq total) by mouth daily.    Dispense:  7 tablet    Refill:  0    No Follow-up on  file.   [name], M.D.  [facility] [address] [place], [place]  [phone] phone [phone] fax

## 2013-09-03 NOTE — Patient Instructions (Signed)
Diarrhea Diarrhea is frequent loose and watery bowel movements. It can cause you to feel weak and dehydrated. Dehydration can cause you to become tired and thirsty, have a dry mouth, and have decreased urination that often is dark yellow. Diarrhea is a sign of another problem, most often an infection that will not last long. In most cases, diarrhea typically lasts 2 3 days. However, it can last longer if it is a sign of something more serious. It is important to treat your diarrhea as directed by your caregive to lessen or prevent future episodes of diarrhea. CAUSES  Some common causes include:  Gastrointestinal infections caused by viruses, bacteria, or parasites.  Food poisoning or food allergies.  Certain medicines, such as antibiotics, chemotherapy, and laxatives.  Artificial sweeteners and fructose.  Digestive disorders. HOME CARE INSTRUCTIONS  Ensure adequate fluid intake (hydration): have 1 cup (8 oz) of fluid for each diarrhea episode. Avoid fluids that contain simple sugars or sports drinks, fruit juices, whole milk products, and sodas. Your urine should be clear or pale yellow if you are drinking enough fluids. Hydrate with an oral rehydration solution that you can purchase at pharmacies, retail stores, and online. You can prepare an oral rehydration solution at home by mixing the following ingredients together:    tsp table salt.   tsp baking soda.   tsp salt substitute containing potassium chloride.  1  tablespoons sugar.  1 L (34 oz) of water.  Certain foods and beverages may increase the speed at which food moves through the gastrointestinal (GI) tract. These foods and beverages should be avoided and include:  Caffeinated and alcoholic beverages.  High-fiber foods, such as raw fruits and vegetables, nuts, seeds, and whole grain breads and cereals.  Foods and beverages sweetened with sugar alcohols, such as xylitol, sorbitol, and mannitol.  Some foods may be well  tolerated and may help thicken stool including:  Starchy foods, such as rice, toast, pasta, low-sugar cereal, oatmeal, grits, baked potatoes, crackers, and bagels.  Bananas.  Applesauce.  Add probiotic-rich foods to help increase healthy bacteria in the GI tract, such as yogurt and fermented milk products.  Wash your hands well after each diarrhea episode.  Only take over-the-counter or prescription medicines as directed by your caregiver.  Take a warm bath to relieve any burning or pain from frequent diarrhea episodes. SEEK IMMEDIATE MEDICAL CARE IF:   You are unable to keep fluids down.  You have persistent vomiting.  You have blood in your stool, or your stools are black and tarry.  You do not urinate in 6 8 hours, or there is only a small amount of very dark urine.  You have abdominal pain that increases or localizes.  You have weakness, dizziness, confusion, or lightheadedness.  You have a severe headache.  Your diarrhea gets worse or does not get better.  You have a fever or persistent symptoms for more than 2 3 days.  You have a fever and your symptoms suddenly get worse. MAKE SURE YOU:   Understand these instructions.  Will watch your condition.  Will get help right away if you are not doing well or get worse. Document Released: 03/13/2002 Document Revised: 03/09/2012 Document Reviewed: 11/29/2011 Exit[employer] Patient Information 2014 [employer].  

## 2013-09-07 ENCOUNTER — Encounter: Payer: Self-pay | Admitting: Family Medicine

## 2014-01-08 ENCOUNTER — Other Ambulatory Visit: Payer: Self-pay | Admitting: Family Medicine

## 2014-04-04 ENCOUNTER — Telehealth: Payer: Self-pay

## 2014-04-04 MED ORDER — THYROID 30 MG PO TABS: 30.0000 mg | ORAL_TABLET | Freq: Every day | ORAL | Status: DC

## 2014-04-04 NOTE — Telephone Encounter (Signed)
Medication has been pended

## 2014-04-04 NOTE — Telephone Encounter (Signed)
Dr Tamala Julian clinic was cancelled on 1/25.  Patient will need to have her thyroid (ARMOUR) 30 MG tablet Renewed prior to her appt on 02/08.  [facility] [place]   [phone] (H)

## 2014-04-30 ENCOUNTER — Encounter: Payer: Medicare Other | Admitting: Family Medicine

## 2014-05-14 ENCOUNTER — Encounter: Payer: Self-pay | Admitting: Family Medicine

## 2014-05-14 ENCOUNTER — Ambulatory Visit (INDEPENDENT_AMBULATORY_CARE_PROVIDER_SITE_OTHER): Payer: Medicare Other | Admitting: Family Medicine

## 2014-05-14 VITALS — BP 108/50 | HR 73 | Temp 98.0°F | Resp 16 | Ht 61.5 in | Wt 96.0 lb

## 2014-05-14 DIAGNOSIS — G4710 Hypersomnia, unspecified: Secondary | ICD-10-CM

## 2014-05-14 DIAGNOSIS — R636 Underweight: Secondary | ICD-10-CM | POA: Insufficient documentation

## 2014-05-14 DIAGNOSIS — J431 Panlobular emphysema: Secondary | ICD-10-CM

## 2014-05-14 DIAGNOSIS — K219 Gastro-esophageal reflux disease without esophagitis: Secondary | ICD-10-CM

## 2014-05-14 DIAGNOSIS — Z026 Encounter for examination for insurance purposes: Secondary | ICD-10-CM

## 2014-05-14 DIAGNOSIS — Z0000 Encounter for general adult medical examination without abnormal findings: Secondary | ICD-10-CM

## 2014-05-14 DIAGNOSIS — M810 Age-related osteoporosis without current pathological fracture: Secondary | ICD-10-CM

## 2014-05-14 DIAGNOSIS — E034 Atrophy of thyroid (acquired): Secondary | ICD-10-CM

## 2014-05-14 DIAGNOSIS — E038 Other specified hypothyroidism: Secondary | ICD-10-CM

## 2014-05-14 LAB — POCT URINALYSIS DIPSTICK
BILIRUBIN UA: NEGATIVE
Glucose, UA: NEGATIVE
Ketones, UA: NEGATIVE
Nitrite, UA: NEGATIVE
PH UA: 5.5
Protein, UA: NEGATIVE
SPEC GRAV UA: 1.015
UROBILINOGEN UA: 0.2

## 2014-05-14 MED ORDER — THYROID 30 MG PO TABS: 30.0000 mg | ORAL_TABLET | Freq: Every day | ORAL | Status: DC

## 2014-05-14 MED ORDER — IPRATROPIUM-ALBUTEROL 20-100 MCG/ACT IN AERS: 1.0000 | INHALATION_SPRAY | Freq: Four times a day (QID) | RESPIRATORY_TRACT | Status: DC

## 2014-05-14 NOTE — Patient Instructions (Signed)

## 2014-05-14 NOTE — Progress Notes (Signed)
Subjective:    Patient ID: Shelia Fernandez, female    DOB: 30-Aug-1934, 80 y.o.   MRN: [id number]  05/14/2014  Annual Exam and Medication Refill   HPI This 80 y.o. female presents for Annual Wellness Examination.  Last physical: 04-28-2013 Pap smear:07/2013 [facility] Mammogram:  07/2013 [facility]; at his office. Colonoscopy:  Never; too high risk because of unusual anatomy.  No current gastroenterologist. Completes stool kit every year with Dr. Radene Knee.  Bone density:  2013; Fosamax; Forteo x 2.   TDAP:  04/06/2006 Pneumovax:  [id number]  06/2013 Zostavax:  2009; had shingles in past. Influenza:  01/04/2014 Eye exam:  +glasses; 06/2014 scheduled; [place]in.  +early cataracts.  Has cornea problem. Dental exam:  [place] every year.   Hypothyroidism: Patient reports good compliance with medication, good tolerance to medication, and good symptom control.    COPD: followed by pulmonology yearly at [facility]; uses Combivent qid.  No history of smoking but husband was a very heavy smoker.  Osteoporosis: compliance with Calcium and vitamin d daily.  Used Fosamax for years.  S/p Forteo x 2 courses.  Not currently exercising. S/p hip fractures x 2 in past.  Last bone density two years ago.  Do not understand the benefit of obtaining bone density scan every two years.  L hip fracture:  Still limping with L leg; leg has gotten weaker recently since Christmas.  Not exercising.  [facility] has exercise room.    RUQ pain: has RUQ pain that radiates to upper back; started in twenties; follows a very strict diet.  Kept going to doctor due to pain.  Evaluated at [facility]. Dr. Vira Agar could not find course.    Benign position vertigo: recurrent dizziness for past couple of weeks.  +chronic nasal congestion; cannot tolerate antihistamines or Flonase.  Does use nasal saline spray and currently using.  Previous therapy with Dr. Pryor Ochoa with improvement in BPV.    URINARY SCREENING:  RARE URINARY LEAKAGE; NOCTURIA X 1.    Review of Systems  Constitutional: Positive for chills, activity change, appetite change and fatigue. Negative for fever, diaphoresis and unexpected weight change.  HENT: Positive for postnasal drip, sneezing and tinnitus. Negative for congestion, dental problem, drooling, ear discharge, ear pain, facial swelling, hearing loss, mouth sores, nosebleeds, rhinorrhea, sinus pressure, sore throat, trouble swallowing and voice change.   Eyes: Positive for photophobia, itching and visual disturbance. Negative for pain, discharge and redness.  Respiratory: Negative for apnea, cough, choking, chest tightness, shortness of breath, wheezing and stridor.   Cardiovascular: Negative for chest pain, palpitations and leg swelling.  Gastrointestinal: Positive for abdominal pain. Negative for nausea, vomiting, diarrhea, constipation, blood in stool, abdominal distention, anal bleeding and rectal pain.  Endocrine: Positive for cold intolerance. Negative for heat intolerance, polydipsia, polyphagia and polyuria.  Genitourinary: Negative for dysuria, urgency, frequency, hematuria, flank pain, decreased urine volume, vaginal bleeding, vaginal discharge, enuresis, difficulty urinating, genital sores, vaginal pain, menstrual problem, pelvic pain and dyspareunia.  Musculoskeletal: Positive for arthralgias and gait problem. Negative for myalgias, back pain, joint swelling, neck pain and neck stiffness.  Skin: Negative for color change, pallor, rash and wound.  Allergic/Immunologic: Positive for environmental allergies. Negative for food allergies and immunocompromised state.  Neurological: Positive for dizziness. Negative for tremors, seizures, syncope, facial asymmetry, speech difficulty, weakness, light-headedness, numbness and headaches.  Hematological: Negative for adenopathy. Does not bruise/bleed easily.  Psychiatric/Behavioral: Negative for suicidal ideas, hallucinations, behavioral problems, confusion, sleep  disturbance, self-injury, dysphoric mood, decreased concentration and agitation. The  patient is not nervous/anxious and is not hyperactive.     Past Medical History  Diagnosis Date  . Cataract   . COPD (chronic obstructive pulmonary disease)   . Myocardial infarction   . Thyroid disease     Hypothyroidism  . Osteoporosis     R hip fracture; L hip fracture.  Intolerance to bisphosphonates; GI upset.  Took bisphosphonates x 10 years; last bisphosphonates 2000.  s/p L wrist fracture 2001.  Marland Kitchen Benign positional vertigo     s/p ENT consultation with Eply maneuvers 2014.  [facility].  Marland Kitchen GERD (gastroesophageal reflux disease)   . Diarrhea    Past Surgical History  Procedure Laterality Date  . Appendectomy    . Tubal ligation    . Ent consult      +BPV; Eply maneuvers wtih improvement.  [facility].  . Colonoscopy      x 2; failure due to spasms.  [facility]. [facility].  Marland Kitchen Fracture surgery      R hip fracture s/p ORIF; L hip fracture s/p ORIF.   Allergies  Allergen Reactions  . Biaxin [Clarithromycin]   . Penicillins   . Sudafed [Pseudoephedrine Hcl]   . Sulfa Antibiotics    Current Outpatient Prescriptions  Medication Sig Dispense Refill  . albuterol-ipratropium (COMBIVENT) 18-103 MCG/ACT inhaler Inhale 2 puffs into the lungs every 6 (six) hours as needed for wheezing.    . Calcium Citrate 250 MG TABS Take by mouth.    . Cholecalciferol (VITAMIN D3) 400 UNITS CAPS Take by mouth.    . Multiple Vitamin (MULTI-VITAMINS) TABS Take by mouth.    . thyroid (ARMOUR) 30 MG tablet Take 1 tablet (30 mg total) by mouth daily. 30 tablet 11  . Ipratropium-Albuterol (COMBIVENT) 20-100 MCG/ACT AERS respimat Inhale 1 puff into the lungs every 6 (six) hours. 4 g 11   No current facility-administered medications for this visit.       Objective:    BP 108/50 mmHg  Pulse 73  Temp(Src) 98 F (36.7 C) (Oral)  Resp 16  Ht 5' 1.5" (1.562 m)  Wt 96 lb (43.545 kg)  BMI 17.85 kg/m2  SpO2  99% Physical Exam  Constitutional: She is oriented to person, place, and time. She appears well-developed and well-nourished. No distress.  HENT:  Head: Normocephalic and atraumatic.  Right Ear: External ear normal.  Left Ear: External ear normal.  Nose: Nose normal.  Mouth/Throat: Oropharynx is clear and moist.  Eyes: Conjunctivae and EOM are normal. Pupils are equal, round, and reactive to light.  Neck: Normal range of motion and full passive range of motion without pain. Neck supple. No JVD present. Carotid bruit is not present. No thyromegaly present.  Cardiovascular: Normal rate, regular rhythm and normal heart sounds.  Exam reveals no gallop and no friction rub.   No murmur heard. Pulmonary/Chest: Effort normal and breath sounds normal. She has no wheezes. She has no rales. Right breast exhibits no inverted nipple, no mass, no nipple discharge, no skin change and no tenderness. Left breast exhibits no inverted nipple, no mass, no nipple discharge, no skin change and no tenderness. Breasts are symmetrical.  Abdominal: Soft. Bowel sounds are normal. She exhibits no distension and no mass. There is no tenderness. There is no rebound and no guarding.  Genitourinary: Rectum normal.  Musculoskeletal: She exhibits no edema.       Right shoulder: Normal.       Left shoulder: Normal.       Cervical back: Normal.  Lymphadenopathy:    She has no cervical adenopathy.  Neurological: She is alert and oriented to person, place, and time. She has normal reflexes. No cranial nerve deficit. She exhibits normal muscle tone. Coordination normal.  Skin: Skin is warm and dry. No rash noted. She is not diaphoretic. No erythema. No pallor.  Psychiatric: She has a normal mood and affect. Her behavior is normal. Judgment and thought content normal.  Nursing note and vitals reviewed.  Results for orders placed or performed in visit on 09/03/13  Amylase  Result Value Ref Range   Amylase 51 0 - 105 U/L   Lipase  Result Value Ref Range   Lipase 32 0 - 75 U/L  Magnesium  Result Value Ref Range   Magnesium 1.9 1.5 - 2.5 mg/dL  Comprehensive metabolic panel  Result Value Ref Range   Sodium 137 135 - 145 mEq/L   Potassium 4.3 3.5 - 5.3 mEq/L   Chloride 100 96 - 112 mEq/L   CO2 27 19 - 32 mEq/L   Glucose, Bld 100 (H) 70 - 99 mg/dL   BUN 16 6 - 23 mg/dL   Creat 0.66 0.50 - 1.10 mg/dL   Total Bilirubin 0.5 0.2 - 1.2 mg/dL   Alkaline Phosphatase 49 39 - 117 U/L   AST 21 0 - 37 U/L   ALT 21 0 - 35 U/L   Total Protein 7.4 6.0 - 8.3 g/dL   Albumin 4.1 3.5 - 5.2 g/dL   Calcium 9.5 8.4 - 10.5 mg/dL  TSH  Result Value Ref Range   TSH 0.551 0.350 - 4.500 uIU/mL  POCT UA - Microscopic Only  Result Value Ref Range   WBC, Ur, HPF, POC 0-3    RBC, urine, microscopic 1-5    Bacteria, U Microscopic trace    Mucus, UA large    Epithelial cells, urine per micros 0-2    Crystals, Ur, HPF, POC neg    Casts, Ur, LPF, POC neg    Yeast, UA neg   POCT urinalysis dipstick  Result Value Ref Range   Color, UA yellow    Clarity, UA clear    Glucose, UA neg    Bilirubin, UA small    Ketones, UA 15    Spec Grav, UA 1.015    Blood, UA trace-lysed    pH, UA 5.5    Protein, UA neg    Urobilinogen, UA 0.2    Nitrite, UA neg    Leukocytes, UA Negative   POCT CBC  Result Value Ref Range   WBC 9.2 4.6 - 10.2 K/uL   Lymph, poc 2.3 0.6 - 3.4   POC LYMPH PERCENT 24.8 10 - 50 %L   MID (cbc) 0.7 0 - 0.9   POC MID % 7.1 0 - 12 %M   POC Granulocyte 6.3 2 - 6.9   Granulocyte percent 68.1 37 - 80 %G   RBC 4.66 4.04 - 5.48 M/uL   Hemoglobin 12.6 12.2 - 16.2 g/dL   HCT, POC 39.1 37.7 - 47.9 %   MCV 83.8 80 - 97 fL   MCH, POC 27.0 27 - 31.2 pg   MCHC 32.2 31.8 - 35.4 g/dL   RDW, POC 13.5 %   Platelet Count, POC 364 142 - 424 K/uL   MPV 9.1 0 - 99.8 fL   EKG: NSR.  No changes.    Assessment & Plan:   1. Encounter for Medicare annual wellness exam   2. Hypothyroidism due to acquired  atrophy of  thyroid   3. Osteoporosis   4. Gastroesophageal reflux disease without esophagitis   5. Panlobular emphysema   6. Hypersomnolence   7. Underweight     1.  Annual Wellness Examination and Complete Physical Examination: anticipatory guidance --- exercise, use of cane with ambulation.  No longer warrants pap smears due to age; will discuss need for yearly pelvic exam with Dr. Radene Knee.  S/p breast exam; recommend yearly mammogram.  Due for DEXA but understand that results will not change management; continue to calcium supplementation.  Immunizations UTD.  Cannot tolerate colonoscopy; performs yearly hemoccults with gynecology.  Has advanced directives; recommend bringing copy for chart; FULL CODE.  Moderate fall risk. Independent with most ADLs; excellent support from family. No hearing loss. 2. Hypothyroidism: stable; obtain labs; refill of medication provided.  Recent hypersomnolence; obtain labs. 3.  Osteoporosis: persistent; s/p 2 hip fractures warranting repair.  S/p Fosamax for years; s/p Forteo in past x 2 courses. Continue Calcium plus vitamin D.  Restart exercise program. 4.  GERD: improved; no longer warranting PPI. 5.  COPD: stable; refill of Combivent qid provided; followed by [facility]; does not desire to return unless clinically declines. 6.  Hypersomnolence: Recurrent; obtain labs, EKG.  If persists, refer back to pulmonology to evaluate for CO2 retention versus sleep disorder.  May warrant sleep study.  Recommend restarting exercise program.  Low diastolic readings chronic; encourage hydration and liberal salt intake.  Call if persists or worsens.   7. Benign position vertigo: Recurrent; recommend restarting Eply maneuvers at home; if clinically worsens, follow-up with [facility]. 8. Underweight: recommend weight gain with higher fat and higher protein food choices.  Meds ordered this encounter  Medications  . Calcium Citrate 250 MG TABS    Sig: Take by mouth.  .  Cholecalciferol (VITAMIN D3) 400 UNITS CAPS    Sig: Take by mouth.  . Multiple Vitamin (MULTI-VITAMINS) TABS    Sig: Take by mouth.  . DISCONTD: Ipratropium-Albuterol (COMBIVENT) 20-100 MCG/ACT AERS respimat    Sig: Inhale into the lungs.  Marland Kitchen thyroid (ARMOUR) 30 MG tablet    Sig: Take 1 tablet (30 mg total) by mouth daily.    Dispense:  30 tablet    Refill:  11  . Ipratropium-Albuterol (COMBIVENT) 20-100 MCG/ACT AERS respimat    Sig: Inhale 1 puff into the lungs every 6 (six) hours.    Dispense:  4 g    Refill:  11    Return in about 1 year (around 05/15/2015) for complete physical examiniation.     Kristi Elayne Guerin, M.D. [facility] [address] [place], [place]  [address] [phone] phone [phone] fax

## 2014-05-15 ENCOUNTER — Other Ambulatory Visit: Payer: Self-pay | Admitting: Radiology

## 2014-05-15 ENCOUNTER — Telehealth: Payer: Self-pay | Admitting: Radiology

## 2014-05-15 DIAGNOSIS — E034 Atrophy of thyroid (acquired): Secondary | ICD-10-CM

## 2014-05-15 LAB — CBC WITH DIFFERENTIAL/PLATELET
BASOS ABS: 0.1 10*3/uL (ref 0.0–0.1)
BASOS PCT: 1 % (ref 0–1)
EOS PCT: 2 % (ref 0–5)
Eosinophils Absolute: 0.1 10*3/uL (ref 0.0–0.7)
HEMATOCRIT: 41.5 % (ref 36.0–46.0)
Hemoglobin: 13.4 g/dL (ref 12.0–15.0)
Lymphocytes Relative: 27 % (ref 12–46)
Lymphs Abs: 1.7 10*3/uL (ref 0.7–4.0)
MCH: 27.2 pg (ref 26.0–34.0)
MCHC: 32.3 g/dL (ref 30.0–36.0)
MCV: 84.2 fL (ref 78.0–100.0)
MPV: 10.6 fL (ref 8.6–12.4)
Monocytes Absolute: 0.6 10*3/uL (ref 0.1–1.0)
Monocytes Relative: 10 % (ref 3–12)
NEUTROS PCT: 60 % (ref 43–77)
Neutro Abs: 3.8 10*3/uL (ref 1.7–7.7)
PLATELETS: 303 10*3/uL (ref 150–400)
RBC: 4.93 MIL/uL (ref 3.87–5.11)
RDW: 13.9 % (ref 11.5–15.5)
WBC: 6.4 10*3/uL (ref 4.0–10.5)

## 2014-05-15 LAB — COMPREHENSIVE METABOLIC PANEL
ALBUMIN: 4.4 g/dL (ref 3.5–5.2)
ALK PHOS: 63 U/L (ref 39–117)
ALT: 19 U/L (ref 0–35)
AST: 23 U/L (ref 0–37)
BUN: 21 mg/dL (ref 6–23)
CO2: 28 mEq/L (ref 19–32)
Calcium: 9.4 mg/dL (ref 8.4–10.5)
Chloride: 103 mEq/L (ref 96–112)
Creat: 0.57 mg/dL (ref 0.50–1.10)
GLUCOSE: 98 mg/dL (ref 70–99)
POTASSIUM: 4.3 meq/L (ref 3.5–5.3)
Sodium: 139 mEq/L (ref 135–145)
TOTAL PROTEIN: 7.2 g/dL (ref 6.0–8.3)
Total Bilirubin: 0.6 mg/dL (ref 0.2–1.2)

## 2014-05-15 LAB — T4, FREE: Free T4: 0.88 ng/dL (ref 0.80–1.80)

## 2014-05-15 LAB — TSH: TSH: 0.736 u[IU]/mL (ref 0.350–4.500)

## 2014-05-15 MED ORDER — THYROID 30 MG PO TABS: 30.0000 mg | ORAL_TABLET | Freq: Every day | ORAL | Status: DC

## 2014-05-15 NOTE — Telephone Encounter (Signed)
Patient needs Amour thyroid medication written for 90 days for insurance purpose.

## 2014-05-15 NOTE — Telephone Encounter (Signed)
Pharmacy called for patient, patient needs 90 day supply for ins purposes.  Bennett Scrape PA-C verbally approved. Ordered medication, sent refill request to [facility].  Called pharmacy to inform 90 day supply approved

## 2014-05-16 NOTE — Telephone Encounter (Signed)
This was already done on 05/15/2014 by Bennett Scrape, PA-C

## 2014-06-04 ENCOUNTER — Telehealth: Payer: Self-pay

## 2014-06-04 DIAGNOSIS — E034 Atrophy of thyroid (acquired): Secondary | ICD-10-CM

## 2014-06-04 NOTE — Telephone Encounter (Signed)
Dr. Tamala Julian, pt called about labs. Relayed lab message and sent copy to her. She is worried about her TSH and Free T4 being on the low side of normal. Wants to know what your thought are and if she adjust her Synthroid dose. Very worried about sxs of fatigue and always being cold. Please advise. Thanks

## 2014-06-05 NOTE — Telephone Encounter (Signed)
Call --- free T4 was lower limits of normal but still normal; TSH was also lower limits of normal. Both levels are very similar to levels in 08/2013 thus it would be unlikely if current thyroid levels were the cause of severe fatigue and being cold.    Has patient started exercising again to see if fatigue and cold intolerance would improve?  If yes, has it made any difference yet?  How often is she exercising and for how long?

## 2014-06-12 NOTE — Telephone Encounter (Signed)
Pt is walking 30/day qd and has been doing this since before her sxs started. Please advise. Thanks

## 2014-06-19 MED ORDER — THYROID 48.75 MG PO TABS: 48.7500 mg | ORAL_TABLET | Freq: Every day | ORAL | Status: DC

## 2014-06-19 NOTE — Telephone Encounter (Signed)
Called pt, advised message from Dr. Tamala Julian

## 2014-06-19 NOTE — Telephone Encounter (Signed)
Call --- agreeable to trial of 48.75mg  daily of Armour Thyroid.  I will send in higher dose of medication to pharmacy.  Pt will need repeat labs in 2 months.  I will have scheduling contact her for appointment.

## 2014-07-27 NOTE — Consult Note (Signed)
PATIENT NAME:  Shelia Fernandez, Shelia Fernandez MR#:  [id number] DATE OF BIRTH:  [birth year]  DATE OF CONSULTATION:  01/08/2013  REFERRING PHYSICIAN:   CONSULTING PHYSICIAN:  Maebelle Munroe, MD  CHIEF COMPLAINT: Left hip pain.   HISTORY OF PRESENT ILLNESS: Shelia Fernandez is a 80 year old female with a known history of osteoporosis and prior right femoral neck fracture, status post percutaneous screw fixation, who sustained a mechanical fall down three stairs this evening with immediate pain and inability to ambulate over the lateral aspect of the hip, complaining of sharp and burning, intermittent pain over the groin of the left hip. She denied any antecedent pain. She denied any loss of consciousness.   REVIEW OF SYSTEMS: NEUROLOGIC: No loss of consciousness.  SKIN: No laceration.  RESPIRATORY: No shortness of breath.  CARDIAC: No chest pain.  MUSCULOSKELETAL: Complains of left hip pain only.  all other review of systems are negative  PAST MEDICAL HISTORY: Remarkable for osteoporosis. Thyroid disease.  PAST SURGICAL HISTORY: Remarkable for right hip percutaneous screw fixation of femoral neck fracture.   ALLERGIES: Include BACITRACIN, PENICILLIN AND SULFA.   SOCIAL HISTORY: Lives at home with her daughter and son-in-law.  Nonsmoker, nondrinker.   PHYSICAL EXAMINATION:  GENERAL: Pleasant, alert, female appearing younger than stated age.  PSYCHIATRIC: Mood and affect appropriate.  VITAL SIGNS: On presentation to the Emergency Department, blood pressure 124/60 with heart rate of 90.  SKIN: Warm and intact over the left hip.  HEENT: Normocephalic, atraumatic. Sclerae clear. Oral mucosal membranes are slightly dry.  LUNGS: Clear to auscultation bilaterally.  HEART: Regular rate and rhythm. Normal S1 and S2.  ABDOMEN: Soft, nontender, nondistended. Positive bowel sounds.  LYMPHATIC: Mild swelling over the left lateral hip.  NEUROLOGIC: Motor and light touch sensation examination intact in the left lower  extremity.  VASCULAR: 2+ dorsalis pedis and posterior tibialis pulse.  MUSCULOSKELETAL: Bilateral upper extremities and right lower extremity all nontender to palpation. Left lower extremity is shortened and externally rotated. There is moderate tenderness to palpation over the anterolateral aspect of the left hip, at the level of the greater trochanter.  Remainder of the ipsilateral left lower extremity is nontender to palpation and without deformity.   Radiographs of the left hip and femur demonstrate a displaced, two-part intertrochanteric fracture, with extension of the fracture into the lateral wall inferiorly in a nondisplaced fashion. The fracture is externally rotated and shortened.   LABORATORY EVALUATION: Today shows chemistries are unremarkable, including excellent albumin. Hemoglobin and hematocrit 13.1 and 39.3 with platelets at 242,000. Blood type is B-positive with a negative antibody screen and coagulation parameters normal at 13, 3 and 1.   IMPRESSION: Closed, displaced left hip intertrochanteric fracture.   PLAN: Admission evaluation by the hospitalist service for medical optimization for surgical intervention. Buck's traction, 5 pounds, left lower extremity. TED and sequentials to be applied to the nonoperative leg and proceed with surgical management by Dr. Marry Guan once the patient has been medically optimized, either cephalomedullary nail construct or sliding hip screw. This was explained to the patient.  ____________________________ Maebelle Munroe, MD jfs:cg D: 01/08/2013 20:37:00 ET T: 01/09/2013 02:37:21 ET JOB#: [id number]  cc: Maebelle Munroe, MD, <Dictator> Maebelle Munroe MD ELECTRONICALLY SIGNED 01/09/2013 4:33

## 2014-07-27 NOTE — Op Note (Signed)
PATIENT NAME:  Shelia Fernandez, Shelia Fernandez MR#:  [id number] DATE OF BIRTH:  06/30/1934  DATE OF PROCEDURE:  01/09/2013  PREOPERATIVE DIAGNOSIS: Left intertrochanteric femur fracture.   POSTOPERATIVE DIAGNOSIS: Left intertrochanteric femur fracture.   PROCEDURE PERFORMED: Open reduction and internal fixation of left intertrochanteric femur fracture.   SURGEON: Laurice Record. Hooten, MD   ANESTHESIA: Spinal.   ESTIMATED BLOOD LOSS: 100 mL.   FLUIDS REPLACED: 800 mL of crystalloid.   DRAINS: None.   IMPLANTS UTILIZED: Synthes 11 mm 130 degree titanium cannulated trochanteric fixation nail, a 90 mm helical blade, and a 5 mm x 32 mm locking screw.   INDICATIONS FOR SURGERY: The patient is a 80 year old female who fell at home while coming down stairs and landed on her left hip and side. Radiographs obtained at [facility] Emergency Room demonstrated a left intertrochanteric femur fracture. After discussion of the risks and benefits of surgical intervention, the patient expressed understanding of the risks and benefits and agreed with plans for surgical intervention.   PROCEDURE IN DETAIL: The patient was brought to the Operating Room and, after adequate spinal anesthesia was achieved, the patient was placed on the fracture table. All bony prominences were well padded. Traction was applied to the left lower extremity and provisional reduction of the fracture was performed and confirmed in both AP and lateral planes using the C-arm. The patient's left hip and leg were cleaned and prepped with alcohol and DuraPrep, draped in the usual sterile fashion. A "timeout" was performed as per usual protocol.   A lateral longitudinal incision was made, extending from the proximal portion of the trochanter proximally. Fascia was incised in line with the skin incision, and the fibers of the gluteus were split in line. The tip of the greater trochanter was palpated, and a distally threaded guide pin was inserted into the proximal  intramedullary canal. Position was confirmed in both AP and lateral planes using the C-arm. The entry site was enlarged using a step drill.   An 11 mm, 130 degree trochanteric fixation nail was then advanced over the guidewire. Good position was noted using C-arm. Guidewire was removed. A second stab incision was made and a tissue protector was inserted through the outrigger device and advanced to the lateral cortex of the femur. A distally threaded guide pin was inserted into the femoral neck and head. Good position was noted in both AP and lateral planes using the C-arm.   Measurements were obtained, and it was felt that a 90 mm helical blade was appropriate length. Lateral cortex was reamed, followed by advancement of a cannulated reamer over the guidewire. A 90 mm helical blade was then advanced over the guidewire and impacted into place. Good fixation was appreciated. The locking sleeve was engaged proximally. Finally, a third stab incision was made, and a tissue protector advanced to the lateral cortex for placement of the distal locking screw. A 5.0 mm x 32 mm locking screw was then inserted. Outrigger device was removed. Good position of the implant and axillary reduction of the fracture was noted in both AP and lateral planes using the C-arm.   The wounds were irrigated with copious amounts of normal saline with antibiotic solution. Good hemostasis was noted. Fascia was reapproximated using interrupted sutures of #1 Vicryl. The subcutaneous tissue was approximated in layers using first #0 Vicryl, followed by 2-0 Vicryl. Skin was closed with skin staples. A sterile dressing was applied.   The patient tolerated the procedure well. She was transported to the  recovery room in stable condition.    ____________________________ Laurice Record. Holley Bouche., MD jph:cg D: 01/10/2013 06:18:27 ET T: 01/10/2013 06:47:41 ET JOB#: [id number]  cc: Laurice Record. Holley Bouche., MD, <Dictator> Laurice Record Holley Bouche  MD ELECTRONICALLY SIGNED 01/14/2013 15:13

## 2014-07-27 NOTE — Discharge Summary (Signed)
PATIENT NAME:  Shelia Fernandez, Shelia Fernandez MR#:  [id number] DATE OF BIRTH:  [birth year]  DATE OF ADMISSION:  01/08/2013 DATE OF DISCHARGE:  01/12/2013  DISCHARGE DIAGNOSES: 1.  Left hip fracture status post surgery.  2.  Blood loss anemia due to surgery.  Hemoglobin is stable around 9.  3.  Chronic obstructive pulmonary disease.  4.  Mitral valve prolapse.   CONDITION ON DISCHARGE: Stable.   CODE STATUS: Full code.   MEDICATIONS ON DISCHARGE: 1.  Armour Thyroid 30 mg oral once a day.  2.  ProAir 2 puffs inhalation every 6 hours as needed for wheezing.  3.  Combivent 1 puff inhalation 4 times a day.  4.  Calcium and vitamin D 500 plus 200 mg tablet 2 times a day.  5.  Lovenox 30 mg subQ injections for 14 days.  6.  Oxycodone oral tablet 1 to 2 tablets every 4 hours as needed for severe pain.  7.  Tramadol 50 mg oral tablet 1 to 2 tablets every 4 hours as needed for pain, mild to moderate.  8.  Magnesium hydroxide 30 mL 2 times a day as needed for constipation.  9.  Docusate and senna tablet 2 times a day.  10.  Pantoprazole 40 mg oral 2 times a day.   DIET ON DISCHARGE: Regular. Diet consistency: Regular.   Advised to follow within 1 to 2 weeks with Dr. Marry Guan in orthotic clinic.   HISTORY OF PRESENTING ILLNESS: A 80 year old female having mitral valve prolapse and COPD history, but no oxygen use at home. She was using her inhalers rarely, living very active and healthy lifestyle, walking 2 miles every day. She was climbing the stairs and tried to turn backward and suddenly fell down on the floor on her hip, and had a fracture on the left hip, so came to the hospital. On the 9th of October, open reduction and internal fixation of left intertrochanteric femur fracture done by Dr. Marry Guan, and she stayed for 2 postop days for further monitoring. She started tolerating physical therapy very well. Hemoglobin was 13 on admission, came down to 9 and remained stable around that level. Her respiratory  symptoms also remained under control, and we continued using inhalers. The plan is to discharge her to rehab for further management and following with orthopedics after 2 weeks.   IMPORTANT LABORATORY RESULTS IN THE HOSPITAL:  PELVIS X-RAY ON ADMISSION: Left intertrochanteric hip fracture. Urinalysis was negative on admission. Hemoglobin was 11.6, creatinine 0.54, and on the day of discharge, hemoglobin is 9.2.   CONSULT IN THE HOSPITAL: Dr. Marry Guan for orthopedic.   TOTAL TIME SPENT ON THIS DISCHARGE: 40 minutes.   ____________________________ Ceasar Lund Anselm Jungling, MD vgv:dmm D: 01/12/2013 12:00:04 ET T: 01/12/2013 12:26:31 ET JOB#: [id number]  cc: Ceasar Lund. Anselm Jungling, MD, <Dictator> [place] MD ELECTRONICALLY SIGNED 01/16/2013 23:03

## 2014-07-27 NOTE — H&P (Signed)
PATIENT NAME:  Shelia Fernandez, Shelia Fernandez MR#:  [id number] DATE OF BIRTH:  1935-03-24  DATE OF ADMISSION:  01/08/2013  PRIMARY CARE PHYSICIAN:  Dr. Marzetta Board at [place]:  ER physician, Dr. Lavonia Drafts.   CHIEF COMPLAINT: Fall and hip fracture.   HISTORY OF PRESENTING ILLNESS: A 80 year old female who is having mitral valve prolapse and COPD, but no oxygen use at home, and requiring inhalers rarely.  Is living a very active and healthy lifestyle. She is walking almost 2 miles every day without any complaint, was climbing on a stair, and tried to suddenly turn backward and go down, and fell down from 4 steps.  Fell on the floor, on her hip, and started having severe pain on left hip and found having left hip fracture in the ER. ER physician spoke to orthopedic doctor, and they suggested to admit to medicine for primary management, and orthopedic will do surgery.  REVIEW OF SYSTEMS:  CONSTITUTIONAL: Negative for fever, fatigue, weakness, but has pain in the left hip.  EYES: No blurring, double vision, pain, or redness.  EARS, NOSE, THROAT: No tinnitus, ear pain or hearing loss.  RESPIRATORY: No cough, wheezing, hemoptysis, or shortness of breath.  CARDIOVASCULAR: No chest pain, orthopnea, edema, or arrhythmia.  GASTROINTESTINAL: No nausea, vomiting, diarrhea, or abdominal pain.  GENITOURINARY: No dysuria, hematuria, or increased frequency.  ENDOCRINE: No increased sweating, heat or cold intolerance.  SKIN: No acne, rashes or lesions.  MUSCULOSKELETAL: Has pain in left knee.  NEUROLOGICAL: No numbness or weakness. Movement admitted on left lower limb because of pain but otherwise no focal abnormalities.  PSYCHIATRIC: No anxiety or insomnia, bipolar disorder.   PAST MEDICAL HISTORY: 1.  Mitral valve prolapse. 3.  COPD. No need for supplemental oxygen.  4.  Osteoporosis.   PAST SURGICAL HISTORY: Had right hip surgery in 1999, and had a removal of ovary and tubes in the past.    FAMILY HISTORY: Positive for cancer in her mother and sister. She does not know which type.  Father died of some kidney issues.   SOCIAL HISTORY: She lives with her son, and living a very active and healthy life. Denies smoking, drinking alcohol, or illegal drug use.   HOME MEDICATIONS:  She only takes some inhalers for COPD, does not remember the name, but that is as-needed basis; does not require daily basis. Takes some calcium and vitamins for her osteoporosis.   PHYSICAL EXAMINATION:  VITAL SIGNS: In the ER, temperature 98, pulse 89, respirations 18, blood pressure 130/59,  pulse oximetry 95% on room air.  GENERAL: The patient is slightly sleepy because of pain medication but easily arousable and fully oriented to time, place and person.  HEENT: Head and neck atraumatic. Conjunctiva pink. Oral mucosa moist.  NECK: Supple. No JVD.  RESPIRATORY: Bilateral clear and equal air entry.  CARDIOVASCULAR: S1, S2 present, regular. No murmur.  ABDOMEN: Soft, nontender. Bowel sounds present. No organomegaly.  SKIN: No rashes. Legs: No edema. Joints: Pain in the left hip. No other joint tenderness or swelling.  NEUROLOGICAL: Power 4/5 in all three limbs, except left lower limb. Activity is limited because of pain. No tremors or rigidity.   IMPORTANT LABORATORY RESULTS: Glucose 104, BUN 25, creatinine 0.74, sodium 139, potassium 3.9, chloride 105, CO2 of 25, calcium 9.2, total protein 7.5, albumin 3.8, bilirubin 0.4, SGOT 13, SGPT 26. WBC 10.3, hemoglobin 13.1, platelet count 242, MCV 82, INR is 1. Urine is negative for any infection.   ASSESSMENT AND PLAN: A  80 year old female who is living a very active and healthy life, had an accidental fall and developed left hip fracture.  1.  Left hip fracture. We will admit to medical service as per orthopedic request.  Orthopedic surgeon to decide about surgery plan and to manage her deep vein thrombosis prophylaxis and pain management issues. The patient was  walking almost 2 miles every day without any problem, so she is stable for the surgery with age-appropriate risk, but no other presurgical adjustment of medications needed from medical point of view. Will continue following and adjusting medications in peri and postoperative phase, as needed.  2.  History of chronic obstructive pulmonary disease. The patient was needing inhalers on as-needed basis, that was very rarely. No need of any oxygen supplementation, so currently there is no wheezing. We will give DuoNeb as needed basis.  3.  Osteoporosis. We will continue calcium and vitamin D.  4.  Mitral valve prolapse.  This issue is stable, and she was not taking any medication, and currently, there are no symptoms of heart failure or chest pain, so we will continue the same. No change.   TOTAL TIME SPENT ON THIS ADMISSION: 50 minutes   ____________________________ Ceasar Lund Anselm Jungling, MD vgv:cg D: 01/08/2013 21:00:49 ET T: 01/08/2013 23:29:13 ET JOB#: [id number]  cc: Ceasar Lund. Anselm Jungling, MD, <Dictator> Vaughan Basta MD ELECTRONICALLY SIGNED 01/16/2013 23:02

## 2014-08-03 ENCOUNTER — Emergency Department (HOSPITAL_COMMUNITY): Payer: Medicare Other

## 2014-08-03 ENCOUNTER — Emergency Department (HOSPITAL_COMMUNITY)
Admission: EM | Admit: 2014-08-03 | Discharge: 2014-08-03 | Disposition: A | Discharge status: Home / Self Care | Payer: Medicare Other | Attending: Emergency Medicine | Admitting: Emergency Medicine

## 2014-08-03 ENCOUNTER — Encounter (HOSPITAL_COMMUNITY): Payer: Self-pay | Admitting: *Deleted

## 2014-08-03 DIAGNOSIS — Z880 Allergy status to penicillin: Secondary | ICD-10-CM | POA: Insufficient documentation

## 2014-08-03 DIAGNOSIS — Z8739 Personal history of other diseases of the musculoskeletal system and connective tissue: Secondary | ICD-10-CM | POA: Diagnosis not present

## 2014-08-03 DIAGNOSIS — S299XXA Unspecified injury of thorax, initial encounter: Secondary | ICD-10-CM | POA: Insufficient documentation

## 2014-08-03 DIAGNOSIS — Z8639 Personal history of other endocrine, nutritional and metabolic disease: Secondary | ICD-10-CM | POA: Diagnosis not present

## 2014-08-03 DIAGNOSIS — Z8719 Personal history of other diseases of the digestive system: Secondary | ICD-10-CM | POA: Diagnosis not present

## 2014-08-03 DIAGNOSIS — W1839XA Other fall on same level, initial encounter: Secondary | ICD-10-CM | POA: Diagnosis not present

## 2014-08-03 DIAGNOSIS — J449 Chronic obstructive pulmonary disease, unspecified: Secondary | ICD-10-CM | POA: Diagnosis not present

## 2014-08-03 DIAGNOSIS — Y9389 Activity, other specified: Secondary | ICD-10-CM | POA: Insufficient documentation

## 2014-08-03 DIAGNOSIS — Y9289 Other specified places as the place of occurrence of the external cause: Secondary | ICD-10-CM | POA: Insufficient documentation

## 2014-08-03 DIAGNOSIS — R55 Syncope and collapse: Secondary | ICD-10-CM | POA: Diagnosis not present

## 2014-08-03 DIAGNOSIS — I252 Old myocardial infarction: Secondary | ICD-10-CM | POA: Diagnosis not present

## 2014-08-03 DIAGNOSIS — S0083XA Contusion of other part of head, initial encounter: Secondary | ICD-10-CM | POA: Diagnosis not present

## 2014-08-03 DIAGNOSIS — Y998 Other external cause status: Secondary | ICD-10-CM | POA: Diagnosis not present

## 2014-08-03 DIAGNOSIS — W19XXXA Unspecified fall, initial encounter: Secondary | ICD-10-CM

## 2014-08-03 LAB — COMPREHENSIVE METABOLIC PANEL
ALBUMIN: 4 g/dL (ref 3.5–5.2)
ALT: 20 U/L (ref 0–35)
ANION GAP: 10 (ref 5–15)
AST: 32 U/L (ref 0–37)
Alkaline Phosphatase: 59 U/L (ref 39–117)
BUN: 25 mg/dL — AB (ref 6–23)
CHLORIDE: 104 mmol/L (ref 96–112)
CO2: 24 mmol/L (ref 19–32)
Calcium: 9.7 mg/dL (ref 8.4–10.5)
Creatinine, Ser: 0.63 mg/dL (ref 0.50–1.10)
GFR, EST NON AFRICAN AMERICAN: 84 mL/min — AB (ref >90)
Glucose, Bld: 170 mg/dL — ABNORMAL HIGH (ref 70–99)
POTASSIUM: 4.1 mmol/L (ref 3.5–5.1)
SODIUM: 138 mmol/L (ref 135–145)
Total Bilirubin: 0.7 mg/dL (ref 0.3–1.2)
Total Protein: 7.5 g/dL (ref 6.0–8.3)

## 2014-08-03 LAB — CBC
HCT: 41.6 % (ref 36.0–46.0)
Hemoglobin: 13.8 g/dL (ref 12.0–15.0)
MCH: 28 pg (ref 26.0–34.0)
MCHC: 33.2 g/dL (ref 30.0–36.0)
MCV: 84.4 fL (ref 78.0–100.0)
Platelets: 155 10*3/uL (ref 150–400)
RBC: 4.93 MIL/uL (ref 3.87–5.11)
RDW: 13.6 % (ref 11.5–15.5)
WBC: 8.2 10*3/uL (ref 4.0–10.5)

## 2014-08-03 LAB — URINALYSIS, ROUTINE W REFLEX MICROSCOPIC
Bilirubin Urine: NEGATIVE
GLUCOSE, UA: NEGATIVE mg/dL
Hgb urine dipstick: NEGATIVE
KETONES UR: NEGATIVE mg/dL
Nitrite: NEGATIVE
Protein, ur: NEGATIVE mg/dL
SPECIFIC GRAVITY, URINE: 1.013 (ref 1.005–1.030)
Urobilinogen, UA: 0.2 mg/dL (ref 0.0–1.0)
pH: 5.5 (ref 5.0–8.0)

## 2014-08-03 LAB — URINE MICROSCOPIC-ADD ON

## 2014-08-03 LAB — CBG MONITORING, ED: GLUCOSE-CAPILLARY: 178 mg/dL — AB (ref 70–99)

## 2014-08-03 LAB — TSH: TSH: 0.088 u[IU]/mL — AB (ref 0.350–4.500)

## 2014-08-03 LAB — I-STAT TROPONIN, ED: Troponin i, poc: 0 ng/mL (ref 0.00–0.08)

## 2014-08-03 MED ORDER — SODIUM CHLORIDE 0.9 % IV BOLUS (SEPSIS): 1000.0000 mL | Freq: Once | INTRAVENOUS | Status: DC

## 2014-08-03 NOTE — ED Notes (Signed)
Pt states she was standing talking on the phone and she lost consciousness.  Does not know how long she fell.  Small hematoma to L occiput.  Pt states that 2 hours after she passed out she took and shower and almost passed out again.  Now c/o chest pain on exertion.

## 2014-08-03 NOTE — ED Provider Notes (Signed)
CSN: [id number]     Arrival date & time 08/03/14  1323 History   First MD Initiated Contact with Patient 08/03/14 1526     Chief Complaint  Patient presents with  . Loss of Consciousness     (Consider location/radiation/quality/duration/timing/severity/associated sxs/prior Treatment) The history is provided by the patient and medical records.    This is a 80 y.o. F with PMH significant for COPD, MI, hypothyroidism, osteoporosis, BPV, GERD, diarrhea, presenting to the ED for loss of consciousness.  Patient states earlier today she standing in her home and talking on the phone when the room got dark, she got stars, and felt as though she was going to pass out.  States she woke up on the floor, unsure how long she was unconscious.  She did notice a "goose egg" to the back of her head.  About 2 hours after this patient went to take a shower and had near syncopal event, but did not fully pass out this time.  States since this time she has had some heaviness in her chest associated with SOB.  She states it feels like she needs to "cough some congestion out".  Recent nasal congestion without fever, chills, sweats.  She denies current diaphoresis, nausea, vomiting or weakness.  States does have hx of syncope in the past and was evaluated with negative work-up.  She denies recent illness, fever, chills, sweats.  Hx of vertigo but denies current dizziness or lightheadedness.  She does report headache and sacral pain.  No visual disturbance, tinnitus, confusion, changes in speech, numbness, paresthesias, or focal weakness.  Patient not currently on anti-coagulation.  Has ambulated independently since fall without issue.  Doctor adjusted her thyroid medication approx 2 months ago, no other medication changes.  VSS.  Past Medical History  Diagnosis Date  . Cataract   . COPD (chronic obstructive pulmonary disease)   . Myocardial infarction   . Thyroid disease     Hypothyroidism  . Osteoporosis     R hip  fracture; L hip fracture.  Intolerance to bisphosphonates; GI upset.  Took bisphosphonates x 10 years; last bisphosphonates 2000.  s/p L wrist fracture 2001.  Marland Kitchen Benign positional vertigo     s/p ENT consultation with Eply maneuvers 2014.  [facility].  Marland Kitchen GERD (gastroesophageal reflux disease)   . Diarrhea    Past Surgical History  Procedure Laterality Date  . Appendectomy    . Tubal ligation    . Ent consult      +BPV; Eply maneuvers wtih improvement.  [facility]/ENT.  . Colonoscopy      x 2; failure due to spasms.  [facility]. [facility].  Marland Kitchen Fracture surgery      R hip fracture s/p ORIF; L hip fracture s/p ORIF.   Family History  Problem Relation Age of Onset  . Cancer Sister     Breast cancer; endometrial cancer; vaginal cancer  . COPD Sister   . Cancer Sister     Breast cancer  . Cancer Mother 9    cervical cancer  . COPD Father   . Cancer Sister     Melanoma   History  Substance Use Topics  . Smoking status: Never Smoker   . Smokeless tobacco: Not on file  . Alcohol Use: No   OB History    No data available     Review of Systems  Respiratory: Positive for shortness of breath.   Cardiovascular: Positive for chest pain.  Neurological: Positive for syncope.  All other  systems reviewed and are negative.     Allergies  Biaxin; Penicillins; Sudafed; and Sulfa antibiotics  Home Medications   Prior to Admission medications   Medication Sig Start Date End Date Taking? Authorizing Provider  albuterol-ipratropium (COMBIVENT) 18-103 MCG/ACT inhaler Inhale 2 puffs into the lungs every 6 (six) hours as needed for wheezing.    Historical Provider, MD  Calcium Citrate 250 MG TABS Take by mouth. 06/19/10   Historical Provider, MD  Cholecalciferol (VITAMIN D3) 400 UNITS CAPS Take by mouth. 05/16/10   Historical Provider, MD  Ipratropium-Albuterol (COMBIVENT) 20-100 MCG/ACT AERS respimat Inhale 1 puff into the lungs every 6 (six) hours. 05/14/14   Wardell Honour, MD  Multiple Vitamin  (MULTI-VITAMINS) TABS Take by mouth. 05/16/10   Historical Provider, MD  thyroid 48.75 MG TABS Take 48.75 mg by mouth daily. 06/19/14   Wardell Honour, MD   BP 107/45 mmHg  Pulse 89  Temp(Src) 97.8 F (36.6 C) (Oral)  Resp 18  SpO2 96%   Physical Exam  Constitutional: She is oriented to person, place, and time. She appears well-developed and well-nourished.  HENT:  Head: Normocephalic. Head is with contusion.  Mouth/Throat: Oropharynx is clear and moist.  Small hematoma noted to left occipital scalp, TTP without open laceration or wound; mid-face stable  Eyes: Conjunctivae and EOM are normal. Pupils are equal, round, and reactive to light.  Neck: Normal range of motion. Neck supple.  Cardiovascular: Normal rate, regular rhythm and normal heart sounds.   Pulmonary/Chest: Effort normal and breath sounds normal. No respiratory distress. She has no wheezes.  Abdominal: Soft. Bowel sounds are normal. There is no tenderness. There is no guarding.  Musculoskeletal: Normal range of motion.       Lumbar back: Normal.  LS non-tender Sacral tenderness to palpation without noted bruising or deformity; full ROM of BLE Pelvis stable, non-tender  Neurological: She is alert and oriented to person, place, and time.  AAOx3, answering questions appropriately; equal strength UE and LE bilaterally; CN grossly intact; moves all extremities appropriately without ataxia; no focal neuro deficits or facial asymmetry appreciated  Skin: Skin is warm and dry.  Psychiatric: She has a normal mood and affect.  Nursing note and vitals reviewed.   ED Course  Procedures (including critical care time) Labs Review Labs Reviewed  COMPREHENSIVE METABOLIC PANEL - Abnormal; Notable for the following:    Glucose, Bld 170 (*)    BUN 25 (*)    GFR calc non Af Amer 84 (*)    All other components within normal limits  TSH - Abnormal; Notable for the following:    TSH 0.088 (*)    All other components within normal  limits  URINALYSIS, ROUTINE W REFLEX MICROSCOPIC - Abnormal; Notable for the following:    Leukocytes, UA MODERATE (*)    All other components within normal limits  CBC  URINE MICROSCOPIC-ADD ON  CBG MONITORING, ED  Randolm Idol, ED    Imaging Review Dg Chest 2 View  08/03/2014   CLINICAL DATA:  Syncopal episode today. Fall. Left anterior chest pain. COPD.  EXAM: CHEST  2 VIEW  COMPARISON:  03/21/2010  FINDINGS: The heart size and mediastinal contours are within normal limits. Changes of COPD are again in demonstrated.  Right apical pleural thickening and nodular density in the right upper lobe are stable and consistent with benign etiology. No evidence of pulmonary infiltrate or edema. No evidence of pleural effusion.  No mass or lymphadenopathy identified. The visualized skeletal  structures are unremarkable.  IMPRESSION: Stable exam.  COPD.  No active disease.   Electronically Signed   By: Earle Gell M.D.   On: 08/03/2014 16:39   Dg Sacrum/coccyx  08/03/2014   CLINICAL DATA:  Fall. Syncope. Left anterior chest pain. Fall onto back. Sacrococcygeal soreness.  EXAM: SACRUM AND COCCYX - 2+ VIEW  COMPARISON:  96/[id number]/9381  FINDINGS: Helical left hip screw and multiple right hip lag screws noted.  Bony demineralization. The visualized arcuate lines appear continuous. Gas and stool in the colon and bowel obscure parts of the sacrum on the frontal projection.  There is some concave anterior exaggeration sub curvature along the lower sacrum and coccyx, but without obvious bony discontinuity.  IMPRESSION: 1. I do not see a definite fracture. Reduced sensitivity due to bony demineralization in the amount of gas and stool in the pelvic bowel.   Electronically Signed   By: Van Clines M.D.   On: 08/03/2014 16:44   Ct Head Wo Contrast  08/03/2014   CLINICAL DATA:  80 year old female with acute loss of consciousness and fall. Occipital hematoma. Initial encounter.  EXAM: CT HEAD WITHOUT CONTRAST   TECHNIQUE: Contiguous axial images were obtained from the base of the skull through the vertex without intravenous contrast.  COMPARISON:  None.  FINDINGS: Visualized paranasal sinuses and mastoids are clear. No focal scalp hematoma identified. Visualized orbit soft tissues are within normal limits. No acute osseous abnormality identified.  Cerebral volume is within normal limits for age. Calcified atherosclerosis at the skull base. No ventriculomegaly. No midline shift, mass effect, or evidence of intracranial mass lesion. Essentially normal gray-white matter differentiation for age; questionable chronic lacunar in the right occipital lobe series 201, image 8. No acute cortically based infarct identified. No suspicious intracranial vascular hyperdensity. No acute intracranial hemorrhage identified.  IMPRESSION: No acute intracranial abnormality. Negative for age non contrast CT appearance of the brain.   Electronically Signed   By: Genevie Ann M.D.   On: 08/03/2014 16:20     EKG Interpretation None      MDM   Final diagnoses:  Fall  Syncope and collapse   80 year old female with syncope. She has history of same with negative workup in the past. She complains of headache and sacral pain currently.  Neurologic exam is nonfocal. Sacrum is tender but no bony deformity. She also notes some chest pain which she states "feels like she needs to call some congestion out". She does admit to recent nasal congestion but no fever or chills.  EKG sinus rhythm without acute ischemic changes. Labwork is reassuring, troponin negative.TSH is low.   Imaging is negative for acute injuries.  U/a non-infectious.  Orthostatics reassuring.  Given syncope followed by near syncope today, i have recommended admission for overnight observation which daughter is in agreement with however patient declined stating she does not want to stay in the hospital.  Patient is of sound mind, capable of making her own medical decisions and  understands the risks of going home including recurrent syncope, injuries from falls, heart attack, stroke, or even death.  She does have family at home (daughter is Therapist, sports and granddaughter is EMT) so she will be monitored closely.  Given the aforementioned, will not make patient sign out AMA but patient and daughter were given strict return precautions for any new or worsening symptoms.  She will FU with her PCP.  Discussed plan with patient, he/she acknowledged understanding and agreed with plan of care.  Return precautions given  for new or worsening symptoms.   Larene Pickett, PA-C 08/03/14 1918  Larene Pickett, PA-C 08/03/14 [place], MD 08/03/14 2013

## 2014-08-03 NOTE — Discharge Instructions (Signed)
Your work-up today was normal.  There were no injuries noted in your imaging studies. I have recommended admission.  Since you did not want to stay in the hospital, please follow-up with your primary care physician next week. Return here for any new or worsening symptoms-- recurrent syncope, chest pain, dizziness, falls, etc.

## 2014-08-22 LAB — HM DEXA SCAN

## 2014-09-13 ENCOUNTER — Other Ambulatory Visit: Payer: Self-pay | Admitting: Family Medicine

## 2014-10-01 ENCOUNTER — Other Ambulatory Visit: Payer: Self-pay

## 2014-10-09 ENCOUNTER — Encounter: Payer: Self-pay | Admitting: Family Medicine

## 2014-10-11 MED ORDER — THYROID 30 MG PO TABS: 30.0000 mg | ORAL_TABLET | Freq: Every day | ORAL | Status: DC

## 2014-10-11 NOTE — Telephone Encounter (Signed)
PATIENT STATES DR. Tamala Julian CHANGED HER MEDICATION FROM ARMOUR 30 MG TO THYROID 48.75 MG. THE PHARMACIST TOLD HER THAT THIS NEW MEDICINE WAS A SYNTHETIC. SHE CANNOT TAKE IT. WHEN SHE WAS ON THE ARMOUR 30 MG, IT WORKED Engineer, manufacturing. SHE WOULD  LIKE TO BE PUT BACK ON IT. SHE IS GOING TO THE [facility] ON Saturday, SO IF SHE NEEDS TO SEE DR. Tamala Julian AGAIN, SHE CAN COME IN ON Friday. BEST PHONE [phone] (CELL)  Linda [place], [place]

## 2014-10-11 NOTE — Telephone Encounter (Signed)
Dr Tamala Julian, do you want to send in Rx for Armour or have pt RTC Friday?

## 2014-10-11 NOTE — Telephone Encounter (Signed)
I have sent in a prescription for Armour Thyroid 30mg  to pharmacy.  Recommend follow-up with me in upcoming 4-6 weeks.  I have sent a MyChart message to patient.

## 2014-10-15 ENCOUNTER — Ambulatory Visit: Admit: 2014-10-15 | Payer: Self-pay | Admitting: Ophthalmology

## 2014-10-15 SURGERY — PHACOEMULSIFICATION, CATARACT, WITH IOL INSERTION
Anesthesia: Choice | Laterality: Right

## 2014-11-16 ENCOUNTER — Ambulatory Visit (INDEPENDENT_AMBULATORY_CARE_PROVIDER_SITE_OTHER): Payer: Medicare Other | Admitting: Family Medicine

## 2014-11-16 VITALS — BP 108/52 | HR 80 | Temp 97.9°F | Resp 14 | Ht 61.5 in | Wt 94.0 lb

## 2014-11-16 DIAGNOSIS — M810 Age-related osteoporosis without current pathological fracture: Secondary | ICD-10-CM | POA: Diagnosis not present

## 2014-11-16 DIAGNOSIS — E034 Atrophy of thyroid (acquired): Secondary | ICD-10-CM | POA: Diagnosis not present

## 2014-11-16 DIAGNOSIS — K219 Gastro-esophageal reflux disease without esophagitis: Secondary | ICD-10-CM | POA: Diagnosis not present

## 2014-11-16 DIAGNOSIS — R55 Syncope and collapse: Secondary | ICD-10-CM | POA: Diagnosis not present

## 2014-11-16 DIAGNOSIS — R636 Underweight: Secondary | ICD-10-CM

## 2014-11-16 DIAGNOSIS — J431 Panlobular emphysema: Secondary | ICD-10-CM

## 2014-11-16 DIAGNOSIS — E038 Other specified hypothyroidism: Secondary | ICD-10-CM | POA: Diagnosis not present

## 2014-11-16 LAB — CBC WITH DIFFERENTIAL/PLATELET
Basophils Absolute: 0 K/uL (ref 0.0–0.1)
Basophils Relative: 0 % (ref 0–1)
Eosinophils Absolute: 0.1 K/uL (ref 0.0–0.7)
Eosinophils Relative: 1 % (ref 0–5)
HCT: 42.8 % (ref 36.0–46.0)
Hemoglobin: 14.3 g/dL (ref 12.0–15.0)
Lymphocytes Relative: 27 % (ref 12–46)
Lymphs Abs: 1.8 K/uL (ref 0.7–4.0)
MCH: 27.5 pg (ref 26.0–34.0)
MCHC: 33.4 g/dL (ref 30.0–36.0)
MCV: 82.3 fL (ref 78.0–100.0)
MPV: 10.8 fL (ref 8.6–12.4)
Monocytes Absolute: 0.5 K/uL (ref 0.1–1.0)
Monocytes Relative: 8 % (ref 3–12)
Neutro Abs: 4.2 K/uL (ref 1.7–7.7)
Neutrophils Relative %: 64 % (ref 43–77)
Platelets: 323 K/uL (ref 150–400)
RBC: 5.2 MIL/uL — ABNORMAL HIGH (ref 3.87–5.11)
RDW: 14 % (ref 11.5–15.5)
WBC: 6.5 K/uL (ref 4.0–10.5)

## 2014-11-16 LAB — COMPREHENSIVE METABOLIC PANEL WITH GFR
ALT: 14 U/L (ref 6–29)
AST: 22 U/L (ref 10–35)
Albumin: 4.5 g/dL (ref 3.6–5.1)
Alkaline Phosphatase: 63 U/L (ref 33–130)
BUN: 21 mg/dL (ref 7–25)
CO2: 26 mmol/L (ref 20–31)
Calcium: 10 mg/dL (ref 8.6–10.4)
Chloride: 102 mmol/L (ref 98–110)
Creat: 0.59 mg/dL — ABNORMAL LOW (ref 0.60–0.93)
Glucose, Bld: 89 mg/dL (ref 65–99)
Potassium: 5.1 mmol/L (ref 3.5–5.3)
Sodium: 139 mmol/L (ref 135–146)
Total Bilirubin: 0.6 mg/dL (ref 0.2–1.2)
Total Protein: 7.6 g/dL (ref 6.1–8.1)

## 2014-11-16 LAB — POCT URINALYSIS DIPSTICK
Bilirubin, UA: NEGATIVE
Glucose, UA: NEGATIVE
Ketones, UA: NEGATIVE
Nitrite, UA: NEGATIVE
Protein, UA: NEGATIVE
Spec Grav, UA: 1.02
Urobilinogen, UA: 0.2
pH, UA: 5.5

## 2014-11-16 LAB — T4, FREE: Free T4: 0.88 ng/dL (ref 0.80–1.80)

## 2014-11-16 LAB — TSH: TSH: 0.576 u[IU]/mL (ref 0.350–4.500)

## 2014-11-16 MED ORDER — IPRATROPIUM-ALBUTEROL 20-100 MCG/ACT IN AERS: 1.0000 | INHALATION_SPRAY | Freq: Four times a day (QID) | RESPIRATORY_TRACT | Status: DC

## 2014-11-16 NOTE — Progress Notes (Signed)
Subjective:  This chart was scribed for Shelia Forts, MD by Shelia Fernandez, ED Scribe. This patient was seen in room 8 and the patient's care was started at 11:17 AM.  Patient ID: Shelia Fernandez, female    DOB: 1934/09/11, 80 y.o.   MRN: [id number]  HPI Chief Complaint  Patient presents with  . Follow-up    Thyroid check   HPI Comments: Shelia Fernandez is a 80 y.o. female who presents to the [facility] for follow up.   Pt had a medication increase in February from Amour thyroid 30mg  to Armour 48.75. According to her, she had been taking a synthetic thyroid medication called DP Thyroid 48.75, which did not work well for her and requested to switch back to Armour Thyroid 30mg . See patient email, 7/5.  States she was sleeping 15 hours a day.   Lab Results  Component Value Date   TSH 0.088* 08/03/2014   Pt was seen in the ED for a fall with syncope. States she was making her bed when she saw stars and passed out. After passing out she woke up and called her son who had been home. She denies being SOB and CP at that time. She denied wanting to be admitted during that visit. Pt states she had eaten breakfast this morning. Denies seizure activity or incontinence associated with syncopal event.  Underwent ED evaluation.  History of recurrent syncopal events; usually suffers with syncopal event once every two years; usually occurs with activity.  S/p cardiac work up in the past no clear etiology to syncope identified.    Her breathing has been well but states her combivent has expired. She uses combivent 4 times a day. Pt has not been exercising. She is afraid of falling, and prefers to stay home. She has occaisional numbness to fingers.   She is going to a store and using a buggy to exercise and walk three days per week.   Past Medical History  Diagnosis Date  . Cataract   . COPD (chronic obstructive pulmonary disease)   . Myocardial infarction   . Thyroid disease    Hypothyroidism  . Osteoporosis     R hip fracture; L hip fracture.  Intolerance to bisphosphonates; GI upset.  Took bisphosphonates x 10 years; last bisphosphonates 2000.  s/p L wrist fracture 2001.  Marland Kitchen Benign positional vertigo     s/p ENT consultation with Eply maneuvers 2014.  Shelia Fernandez.  Marland Kitchen GERD (gastroesophageal reflux disease)   . Diarrhea    Prior to Admission medications   Medication Sig Start Date End Date Taking? Authorizing Provider  Calcium Citrate-Vitamin D (CALCIUM CITRATE + D PO) Take 1 tablet by mouth daily.   Yes Historical Provider, MD  Ipratropium-Albuterol (COMBIVENT) 20-100 MCG/ACT AERS respimat Inhale 1 puff into the lungs every 6 (six) hours. Patient taking differently: Inhale 1 puff into the lungs 4 (four) times daily -  with meals and at bedtime.  05/14/14  Yes Shelia Honour, MD  Multiple Vitamin (MULTI-VITAMINS) TABS Take 1 tablet by mouth.  05/16/10  Yes Historical Provider, MD  thyroid (ARMOUR THYROID) 30 MG tablet Take 1 tablet (30 mg total) by mouth daily before breakfast. 10/11/14  Yes Shelia Honour, MD   Social History   Social History  . Marital Status: Married    Spouse Name: N/A  . Number of Children: N/A  . Years of Education: N/A   Occupational History  . Not on file.  Social History Main Topics  . Smoking status: Never Smoker   . Smokeless tobacco: Not on file  . Alcohol Use: No  . Drug Use: No  . Sexual Activity: Not on file   Other Topics Concern  . Not on file   Social History Narrative   Marital status: widowed x 1997 after 30+ years       Children:  1 daughter; 2 granddaughters; no gg.      Lives:  With daughter Shelia Fernandez), son-in-law in basement      Tobacco: never; husband smoked      Alcohol:      Exercise:  Sporadic.      ADLs:  No assistant devices; has a cane for PRN use.  Drive; goes to [employer]; no cleaning; no bill paying; daughter pays bills.        Advanced Directives:  +LIVING WILL; +FULL CODE.  HCPOA: daughter(Shelia Fernandez).       Review of Systems  Constitutional: Negative for fever, chills, diaphoresis and fatigue.  Eyes: Negative for visual disturbance.  Respiratory: Negative for cough and shortness of breath.   Cardiovascular: Negative for chest pain, palpitations and leg swelling.  Gastrointestinal: Negative for nausea, vomiting, abdominal pain, diarrhea and constipation.  Endocrine: Negative for cold intolerance, heat intolerance, polydipsia, polyphagia and polyuria.  Neurological: Negative for dizziness, tremors, seizures, syncope, facial asymmetry, speech difficulty, weakness, light-headedness, numbness and headaches.   Objective:   Physical Exam  Constitutional: She is oriented to person, place, and time. She appears well-developed and well-nourished. No distress.  HENT:  Head: Normocephalic and atraumatic.  Right Ear: External ear normal.  Left Ear: External ear normal.  Nose: Nose normal.  Mouth/Throat: Oropharynx is clear and moist.  Eyes: Conjunctivae and EOM are normal. Pupils are equal, round, and reactive to light.  Neck: Normal range of motion. Neck supple. Carotid bruit is not present. No thyromegaly present.  Cardiovascular: Normal rate, regular rhythm, normal heart sounds and intact distal pulses.  Exam reveals no gallop and no friction rub.   No murmur heard. Pulmonary/Chest: Effort normal and breath sounds normal. She has no wheezes. She has no rales.  Abdominal: Soft. Bowel sounds are normal. She exhibits no distension and no mass. There is no tenderness. There is no rebound and no guarding.  Musculoskeletal: Normal range of motion.  Gait is slowed and unsteady; forward posture.  Lymphadenopathy:    She has no cervical adenopathy.  Neurological: She is alert and oriented to person, place, and time. No cranial nerve deficit. She exhibits normal muscle tone. Coordination normal.  Skin: Skin is warm and dry. No rash noted. She is not diaphoretic. No erythema. No pallor.  Psychiatric:  She has a normal mood and affect. Her behavior is normal.  Nursing note and vitals reviewed.    Filed Vitals:   11/16/14 1050  BP: 108/52  Pulse: 80  Temp: 97.9 F (36.6 C)  TempSrc: Oral  Resp: 14  Height: 5' 1.5" (1.562 m)  Weight: 94 lb (42.638 kg)  SpO2: 96%   Wt Readings from Last 3 Encounters:  11/16/14 94 lb (42.638 kg)  05/14/14 96 lb (43.545 kg)  09/03/13 95 lb (43.092 kg)    Assessment & Plan:   1. Syncope and collapse   2. Osteoporosis   3. Hypothyroidism due to acquired atrophy of thyroid   4. Gastroesophageal reflux disease without esophagitis   5. Panlobular emphysema   6. Underweight     1. Syncope and collapse: recurrent; s/p ED  visit; records reviewed; obtain labs.  Pt declined admission in 07/2014 with event; pt declines further evaluation at this time. 2. Osteoporosis:  Stable; high risk for falls; history of hip fracture; encourage regular exercise. 3.  Hypothyroidism: over-corrected; obtain labs. 4.  Emphysema: stable; followed previously by [facility]; refill of Combivent provided; using qid; encourage regular exercise. 5.  Underweight: stable weight.   Orders Placed This Encounter  Procedures  . CBC with Differential/Platelet  . Comprehensive metabolic panel  . TSH  . T4, free  . POCT urinalysis dipstick    Meds ordered this encounter  Medications  . Ipratropium-Albuterol (COMBIVENT) 20-100 MCG/ACT AERS respimat    Sig: Inhale 1 puff into the lungs every 6 (six) hours.    Dispense:  4 g    Refill:  11    I personally performed the services described in this documentation, which was scribed in my presence. The recorded information has been reviewed and considered.  Ashlinn Hemrick Elayne Guerin, M.D. [facility] [address] [place], [place]  [address] [phone] phone [phone] fax

## 2014-12-19 ENCOUNTER — Telehealth: Payer: Self-pay

## 2014-12-19 NOTE — Telephone Encounter (Signed)
Pt would like to know if Dr Tamala Julian would let her get the NEW COLON TEST and she think it is the blood test Please call [phone]

## 2014-12-22 NOTE — Telephone Encounter (Signed)
Lab -- can we mail patient a colosure test?  Can you confirm that her insurance will cover this as well?

## 2014-12-25 NOTE — Telephone Encounter (Signed)
Are you talking about the Cologuard? Just wanted to be sure. Thanks

## 2014-12-25 NOTE — Telephone Encounter (Signed)
Yes. Cologuard.

## 2014-12-26 NOTE — Telephone Encounter (Signed)
Pt advised.

## 2014-12-26 NOTE — Telephone Encounter (Signed)
Please advise patient of insurance approval and process for completion.

## 2014-12-26 NOTE — Telephone Encounter (Signed)
Noted  

## 2014-12-26 NOTE — Telephone Encounter (Signed)
Cologuard form faxed. This test is covered by [facility]

## 2015-01-09 ENCOUNTER — Ambulatory Visit (INDEPENDENT_AMBULATORY_CARE_PROVIDER_SITE_OTHER): Payer: Medicare Other

## 2015-01-09 ENCOUNTER — Ambulatory Visit (INDEPENDENT_AMBULATORY_CARE_PROVIDER_SITE_OTHER): Payer: Medicare Other | Admitting: Family Medicine

## 2015-01-09 ENCOUNTER — Telehealth: Payer: Self-pay

## 2015-01-09 VITALS — BP 100/68 | HR 76 | Temp 98.0°F | Resp 18 | Ht 61.5 in | Wt 97.0 lb

## 2015-01-09 DIAGNOSIS — E038 Other specified hypothyroidism: Secondary | ICD-10-CM

## 2015-01-09 DIAGNOSIS — E034 Atrophy of thyroid (acquired): Secondary | ICD-10-CM | POA: Diagnosis not present

## 2015-01-09 DIAGNOSIS — R194 Change in bowel habit: Secondary | ICD-10-CM

## 2015-01-09 DIAGNOSIS — R140 Abdominal distension (gaseous): Secondary | ICD-10-CM | POA: Diagnosis not present

## 2015-01-09 DIAGNOSIS — R1032 Left lower quadrant pain: Secondary | ICD-10-CM

## 2015-01-09 DIAGNOSIS — R195 Other fecal abnormalities: Secondary | ICD-10-CM

## 2015-01-09 DIAGNOSIS — Z23 Encounter for immunization: Secondary | ICD-10-CM | POA: Diagnosis not present

## 2015-01-09 LAB — POCT CBC
GRANULOCYTE PERCENT: 62.2 % (ref 37–80)
HEMATOCRIT: 41 % (ref 37.7–47.9)
Hemoglobin: 12.6 g/dL (ref 12.2–16.2)
Lymph, poc: 2.1 (ref 0.6–3.4)
MCH: 25.1 pg — AB (ref 27–31.2)
MCHC: 30.7 g/dL — AB (ref 31.8–35.4)
MCV: 81.6 fL (ref 80–97)
MID (CBC): 0.5 (ref 0–0.9)
MPV: 7.9 fL (ref 0–99.8)
POC GRANULOCYTE: 4.3 (ref 2–6.9)
POC LYMPH %: 31 % (ref 10–50)
POC MID %: 6.8 % (ref 0–12)
Platelet Count, POC: 271 10*3/uL (ref 142–424)
RBC: 5.03 M/uL (ref 4.04–5.48)
RDW, POC: 14.2 %
WBC: 6.9 10*3/uL (ref 4.6–10.2)

## 2015-01-09 LAB — POCT URINALYSIS DIP (MANUAL ENTRY)
BILIRUBIN UA: NEGATIVE
GLUCOSE UA: NEGATIVE
Ketones, POC UA: NEGATIVE
Leukocytes, UA: NEGATIVE
Nitrite, UA: NEGATIVE
Protein Ur, POC: NEGATIVE
Spec Grav, UA: 1.01
Urobilinogen, UA: 0.2
pH, UA: 6.5

## 2015-01-09 LAB — IFOBT (OCCULT BLOOD): IMMUNOLOGICAL FECAL OCCULT BLOOD TEST: NEGATIVE

## 2015-01-09 NOTE — Progress Notes (Signed)
Subjective:  This chart was scribed for [name], MD by Thea Alken, ED Scribe. This patient was seen in room 10 and the patient's care was started at 5:17 PM.  Patient ID: Shelia Fernandez, female    DOB: 1935-01-07, 80 y.o.   MRN: [id number]  HPI   Chief Complaint  Patient presents with  . Bowel problems    Onset 3 weeks/ thin stools   HPI Comments: Shelia Fernandez is a 80 y.o. female who presents to the [facility] complaining of trouble with bowel movements and abdominal pain. Pt has had thin, short stools 2-3 times a day along with abdominal bloating and left lower abdominal cramping with occasional shooting pain throughout abdomen. Prior to symptoms she had a 1 episode of watery stool. She reports trouble breathing due to abdominal bloating.  She takes stool softener once a day. She denies abdominal pain keeping her awake at night. She  has been walking around her yard for exercise. She denies gas and burping. She denies nausea , emesis and urinary symptoms.   [place] MOM.  Has probiotic at home; has not been taking it.  Took 2 MOM without improvement.    She agrees to flu shot today.   Bread has always caused bloating.  No increase in bread.     Last appointment with [facility] in 06/2014 for pulmonology; last GI appointment in 2015.  No GERD or heartburn.  Past Medical History  Diagnosis Date  . Cataract   . COPD (chronic obstructive pulmonary disease) ([place])   . Myocardial infarction ([place])   . Thyroid disease     Hypothyroidism  . Osteoporosis     R hip fracture; L hip fracture.  Intolerance to bisphosphonates; GI upset.  Took bisphosphonates x 10 years; last bisphosphonates 2000.  s/p L wrist fracture 2001.  Marland Kitchen Benign positional vertigo     s/p ENT consultation with Eply maneuvers 2014.  Vaught.  Marland Kitchen GERD (gastroesophageal reflux disease)   . Diarrhea    Allergies  Allergen Reactions  . Biaxin [Clarithromycin]     Had a strong a reaction to it (can't  remember specifically)  . Gluten Meal     Stomach upset  . Lactose Intolerance (Gi)   . Sudafed [Pseudoephedrine Hcl]     Can't recall reaction  . Penicillins Hives and Rash  . Sulfa Antibiotics Hives and Rash   Prior to Admission medications   Medication Sig Start Date End Date Taking? Authorizing Provider  Calcium Citrate-Vitamin D (CALCIUM CITRATE + D PO) Take 1 tablet by mouth daily.   Yes Historical Provider, MD  Ipratropium-Albuterol (COMBIVENT) 20-100 MCG/ACT AERS respimat Inhale 1 puff into the lungs every 6 (six) hours. 11/16/14  Yes Wardell Honour, MD  Multiple Vitamin (MULTI-VITAMINS) TABS Take 1 tablet by mouth.  05/16/10  Yes Historical Provider, MD  thyroid (ARMOUR THYROID) 30 MG tablet Take 1 tablet (30 mg total) by mouth daily before breakfast. 10/11/14  Yes Wardell Honour, MD   Review of Systems  Gastrointestinal: Positive for abdominal pain and abdominal distention. Negative for nausea and vomiting.  Genitourinary: Negative for dysuria, urgency, hematuria, decreased urine volume and difficulty urinating.   Objective:   Physical Exam  Constitutional: She is oriented to person, place, and time. She appears well-developed and well-nourished. No distress.  HENT:  Head: Normocephalic and atraumatic.  Eyes: Conjunctivae and EOM are normal.  Neck: Neck supple.  Cardiovascular: Normal rate, regular rhythm and normal heart  sounds.  Exam reveals no gallop and no friction rub.   No murmur heard. Pulmonary/Chest: Effort normal. No respiratory distress. She has no wheezes. She has no rales.  Abdominal: Soft. She exhibits no distension. There is tenderness in the left lower quadrant.  Musculoskeletal: Normal range of motion.  Neurological: She is alert and oriented to person, place, and time.  Skin: Skin is warm and dry.  Psychiatric: She has a normal mood and affect. Her behavior is normal.  Nursing note and vitals reviewed.  Filed Vitals:   01/09/15 1631  BP: 100/68  Pulse:  76  Temp: 98 F (36.7 C)  TempSrc: Oral  Resp: 18  Height: 5' 1.5" (1.562 m)  Weight: 97 lb (43.999 kg)  SpO2: 96%   Results for orders placed or performed in visit on 01/09/15  IFOBT POC (occult bld, rslt in office)  Result Value Ref Range   IFOBT Negative   POCT CBC  Result Value Ref Range   WBC 6.9 4.6 - 10.2 K/uL   Lymph, poc 2.1 0.6 - 3.4   POC LYMPH PERCENT 31.0 10 - 50 %L   MID (cbc) 0.5 0 - 0.9   POC MID % 6.8 0 - 12 %M   POC Granulocyte 4.3 2 - 6.9   Granulocyte percent 62.2 37 - 80 %G   RBC 5.03 4.04 - 5.48 M/uL   Hemoglobin 12.6 12.2 - 16.2 g/dL   HCT, POC 41.0 37.7 - 47.9 %   MCV 81.6 80 - 97 fL   MCH, POC 25.1 (A) 27 - 31.2 pg   MCHC 30.7 (A) 31.8 - 35.4 g/dL   RDW, POC 14.2 %   Platelet Count, POC 271 142 - 424 K/uL   MPV 7.9 0 - 99.8 fL  POCT urinalysis dipstick  Result Value Ref Range   Color, UA yellow yellow   Clarity, UA clear clear   Glucose, UA negative negative   Bilirubin, UA negative negative   Ketones, POC UA negative negative   Spec Grav, UA 1.010    Blood, UA trace-intact (A) negative   pH, UA 6.5    Protein Ur, POC negative negative   Urobilinogen, UA 0.2    Nitrite, UA Negative Negative   Leukocytes, UA Negative Negative   [facility] reading (PRIMARY) by Dr.Makisha Marrin. Abdomen-no acute process.  Assessment & Plan:    1. Abdominal bloating   2. Change in stool caliber   3. Abdominal pain, LLQ   4. Need for prophylactic vaccination and inoculation against influenza   5. Hypothyroidism due to acquired atrophy of thyroid     Orders Placed This Encounter  Procedures  . DG Abd 2 Views    Standing Status: Future     Number of Occurrences: 1     Standing Expiration Date: 01/09/2016    Order Specific Question:  Reason for Exam (SYMPTOM  OR DIAGNOSIS REQUIRED)    Answer:  LLQ pain; bloating; change in caliber of stool.    Order Specific Question:  Preferred imaging location?    Answer:  External  . Flu Vaccine QUAD 36+ mos IM  . T4, free  .  TSH  . Comprehensive metabolic panel  . IFOBT POC (occult bld, rslt in office)  . POCT CBC  . POCT urinalysis dipstick    No orders of the defined types were placed in this encounter.     By signing my name below, I, Raven Small, attest that this documentation has been prepared under the direction  and in the presence of [name] MD.  Electronically Signed: Thea Alken, ED Scribe. 01/09/2015. 6:48 PM.  I personally performed the services described in this documentation, which was scribed in my presence. The recorded information has been reviewed and considered.  Jerren Flinchbaugh Elayne Guerin, M.D. [facility] [address] [place], [place]  [address] [phone] phone [phone] fax

## 2015-01-09 NOTE — Patient Instructions (Signed)
1. Start probiotic daily. 2. Start stool softener daily for 2-3 weeks (Miralax or Colace). 3.  Continue plenty of water daily.

## 2015-01-09 NOTE — Telephone Encounter (Signed)
Pt called. She said she has not received her Cologuard Kit yet. Contacted my Cologuard Rep to check status

## 2015-01-09 NOTE — Telephone Encounter (Signed)
Completed a form that was sent by cologuard and placed in Dr [employer] box for signature. I advised Junie Panning of status.

## 2015-01-10 LAB — COMPREHENSIVE METABOLIC PANEL
ALK PHOS: 57 U/L (ref 33–130)
ALT: 13 U/L (ref 6–29)
AST: 20 U/L (ref 10–35)
Albumin: 4.3 g/dL (ref 3.6–5.1)
BUN: 23 mg/dL (ref 7–25)
CO2: 30 mmol/L (ref 20–31)
Calcium: 9.9 mg/dL (ref 8.6–10.4)
Chloride: 103 mmol/L (ref 98–110)
Creat: 0.72 mg/dL (ref 0.60–0.93)
GLUCOSE: 93 mg/dL (ref 65–99)
POTASSIUM: 4.3 mmol/L (ref 3.5–5.3)
Sodium: 140 mmol/L (ref 135–146)
Total Bilirubin: 0.4 mg/dL (ref 0.2–1.2)
Total Protein: 7.5 g/dL (ref 6.1–8.1)

## 2015-01-10 LAB — T4, FREE: FREE T4: 1.03 ng/dL (ref 0.80–1.80)

## 2015-01-10 LAB — TSH: TSH: 0.174 u[IU]/mL — AB (ref 0.350–4.500)

## 2015-01-10 IMAGING — CR DG CHEST 1V
1 series · 1 of 1 positions shown · non-contrast
Comparison: none

REASON FOR EXAM: preoperative
COMMENTS:

PROCEDURE:     DXR - DXR CHEST 1 VIEWAP OR PA  - January 08, 2013  [DATE]
RESULT:     History: Preoperative chest. Fall.
Comparison Study: Chest x-ray 03/21/2010.

[ap]
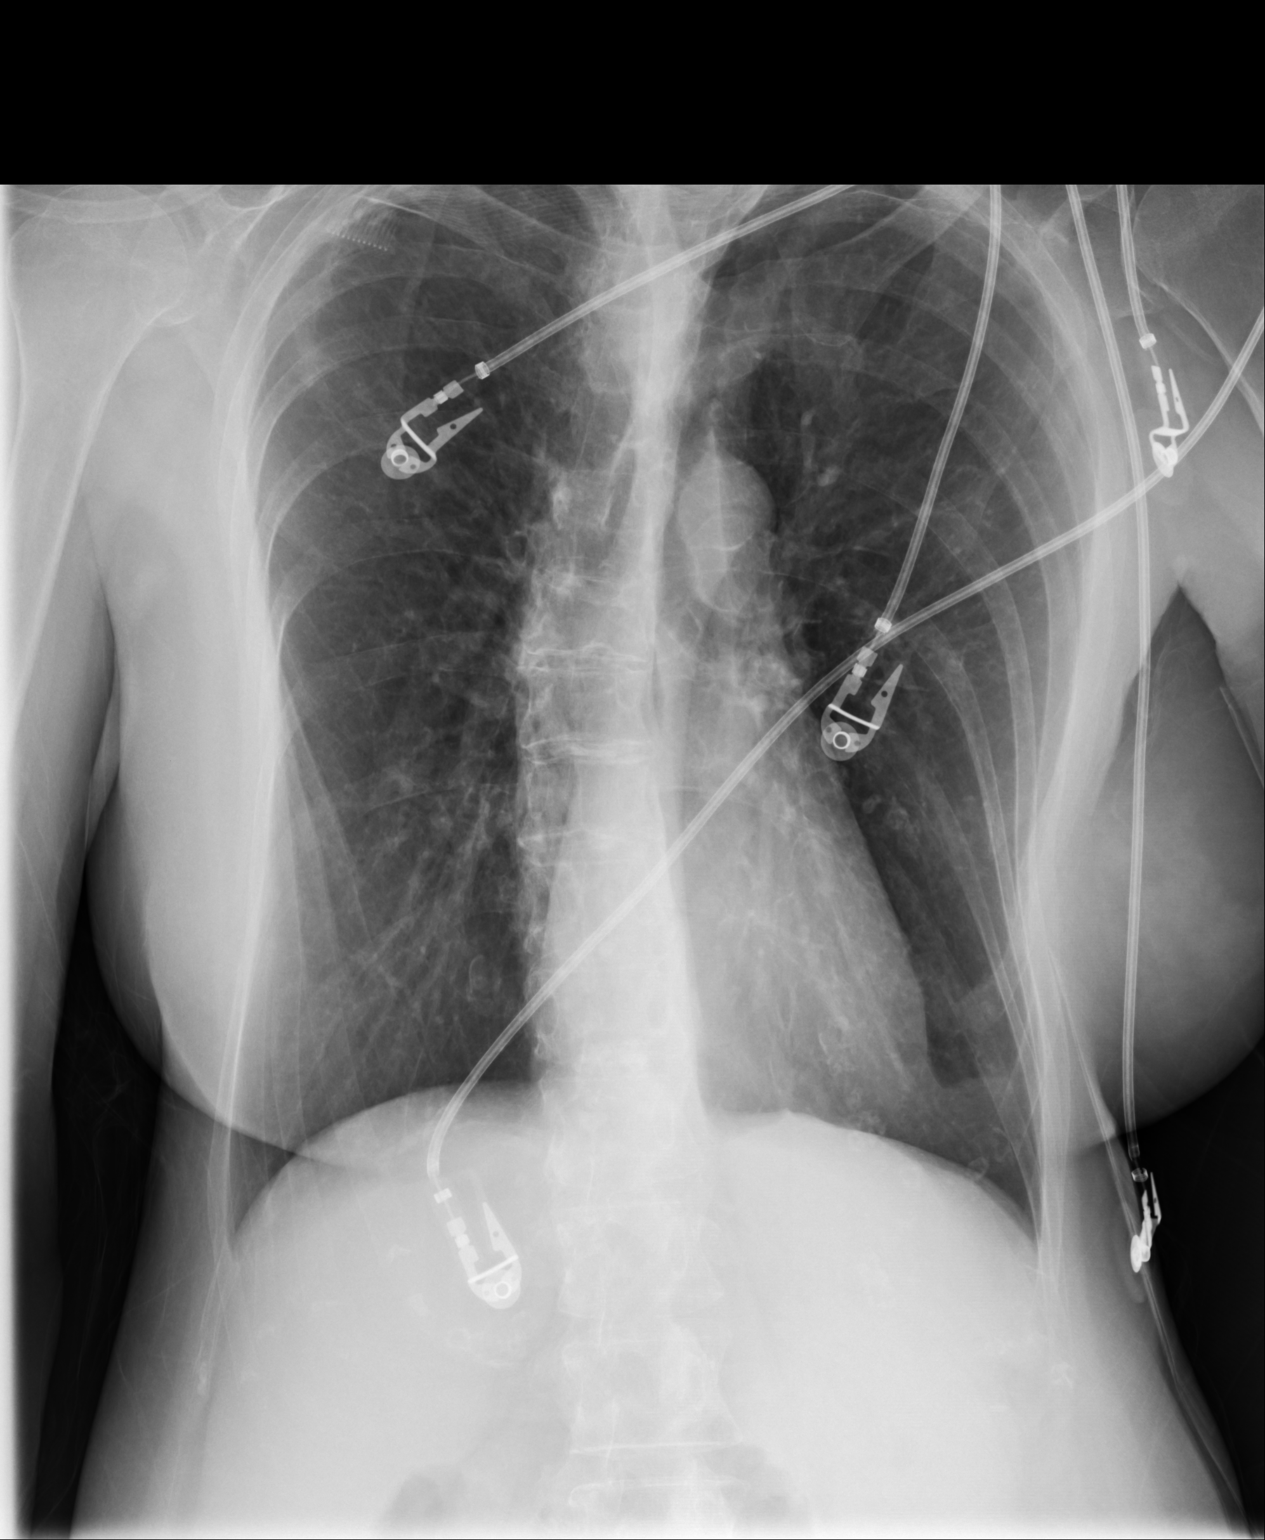

[1 of 1 positions shown; findings below may reference images not displayed]

FINDINGS: Calcified pulmonary nodule right upper lobe consistent with
granuloma. This is stable. Lungs are clear. Mediastinum and hilar structures
are normal. Heart size normal. Scoliosis and degenerative change thoracic
spine. Subclavian vascular calcification.
IMPRESSION: Stable chest. Calcified nodule right upper lobe is stable
consistent with granuloma.

## 2015-01-10 IMAGING — CR DG FEMUR 2V*L*
1 series · 4 of 4 positions shown · non-contrast
Comparison: none

REASON FOR EXAM: shortened left leg s/p fall
COMMENTS:

PROCEDURE:     DXR - DXR FEMUR LEFT  - January 08, 2013  [DATE]
RESULT:     History: Fall.
Comparison Study: No prior.

[Series 1: ap · 0.17mm/px · 4 of 4 slices shown]
[im 1/4]
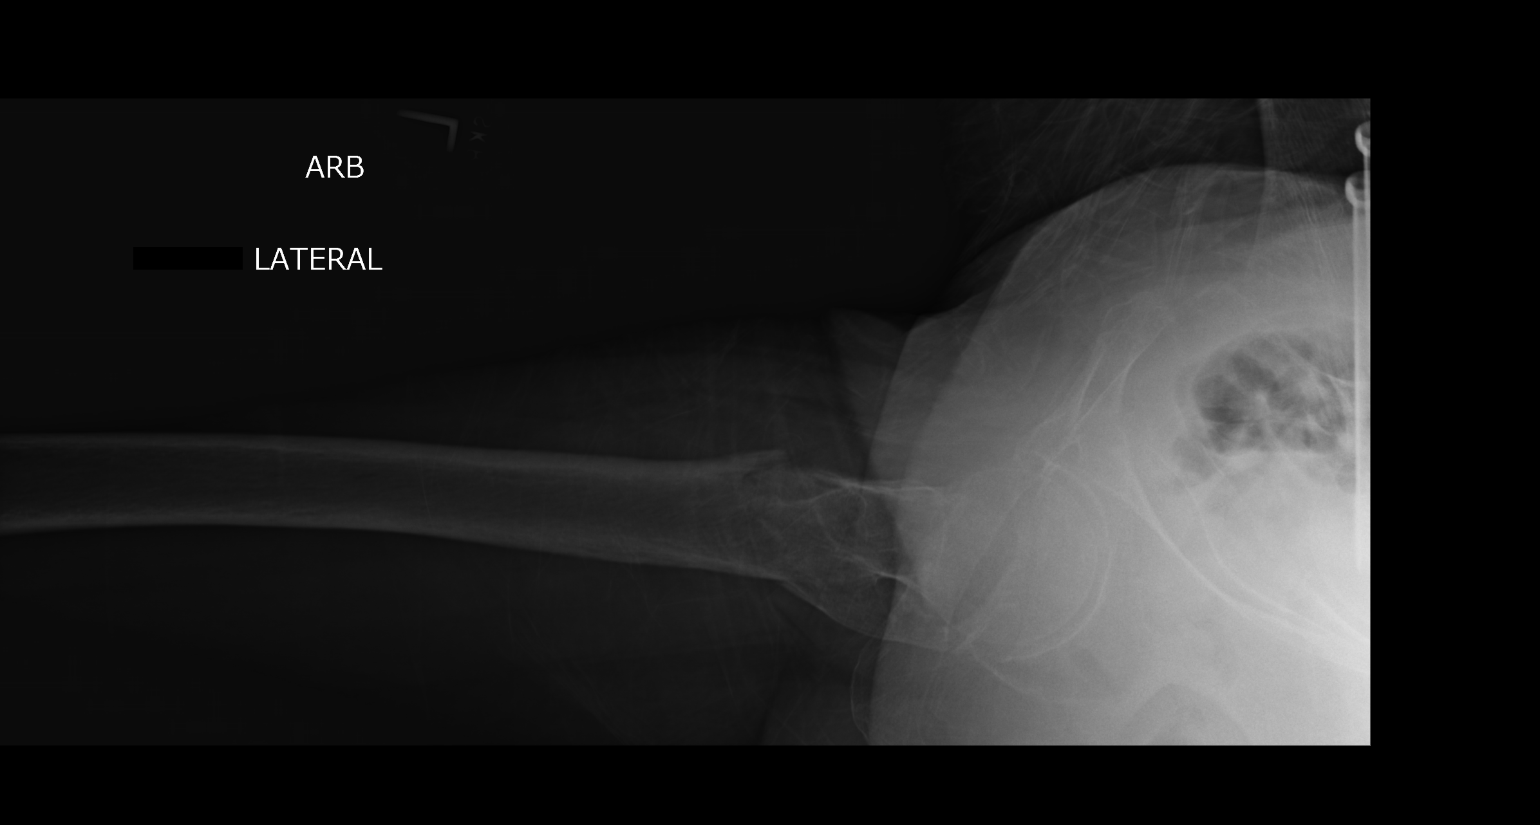
[im 2/4]
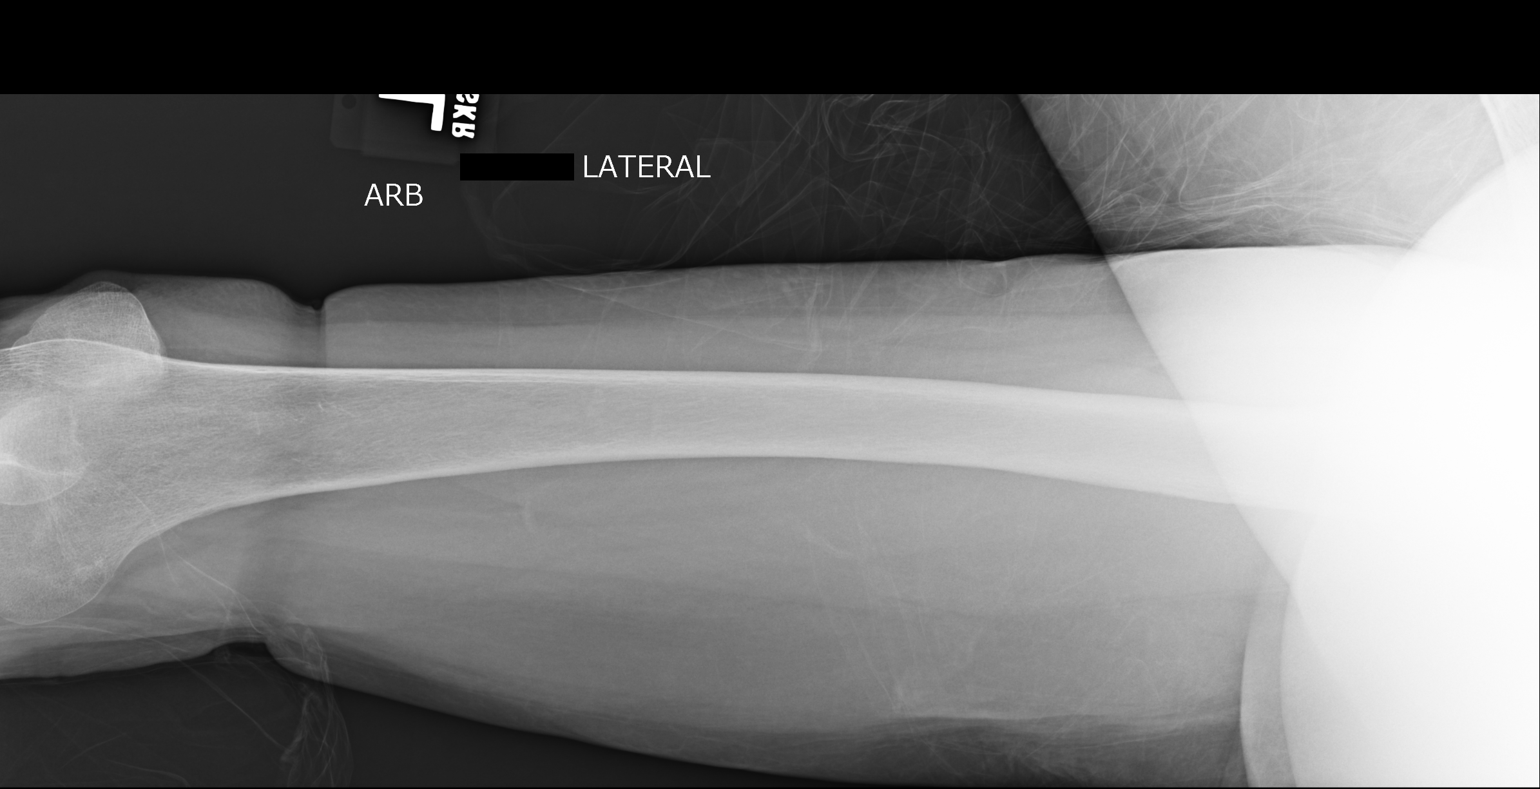
[im 3/4]
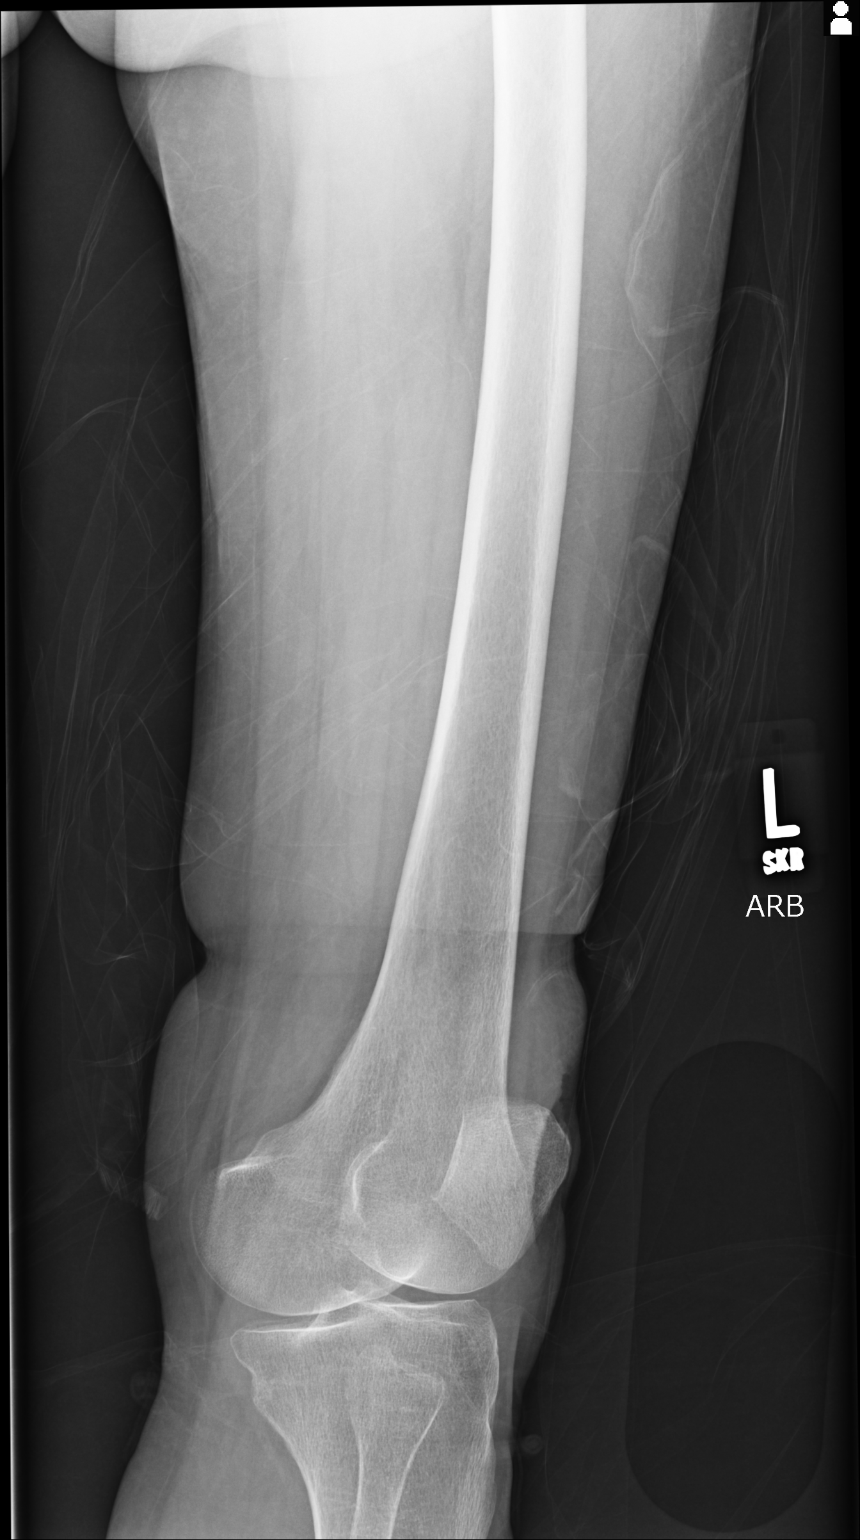
[im 4/4]
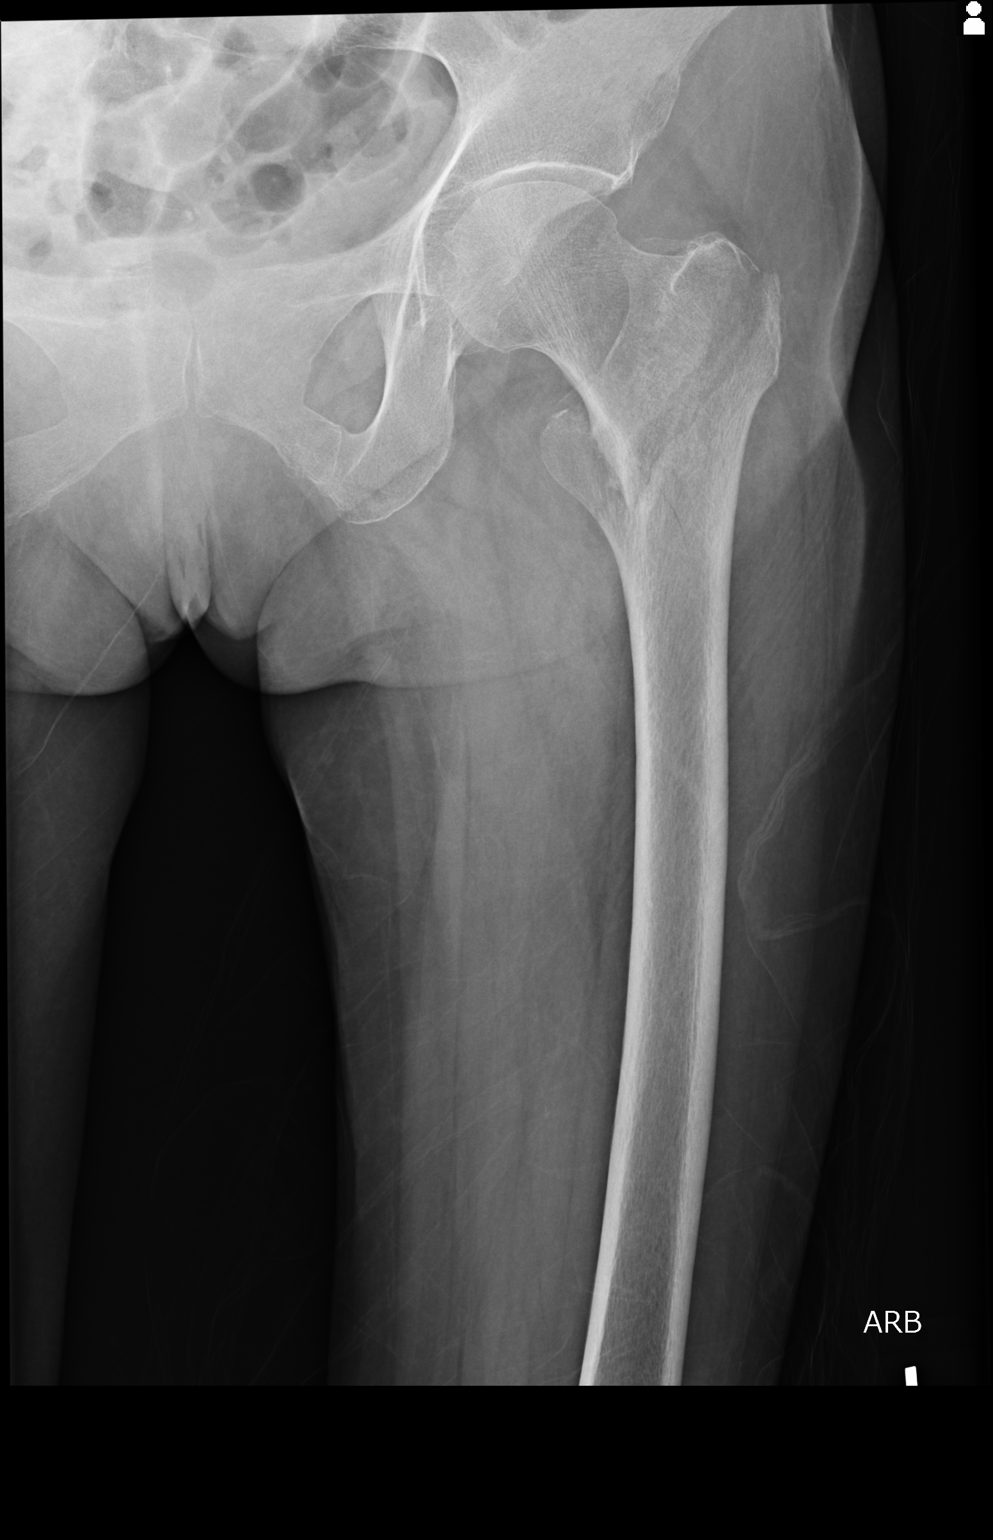

[4 of 4 positions shown; findings below may reference images not displayed]

FINDINGS: Left intertrochanteric hip fracture is present. Fracture slightly
angulated. Fracture is displaced. Degenerative changes left hip. There is a
fracture of the left inferior pubic ramus. Age undetermined.
IMPRESSION: Left intertrochanteric hip fracture. Fracture the left
inferior pubic ramus , age undetermined.

## 2015-01-11 ENCOUNTER — Emergency Department (HOSPITAL_COMMUNITY)
Admission: EM | Admit: 2015-01-11 | Discharge: 2015-01-11 | Disposition: A | Discharge status: Home / Self Care | Payer: Medicare Other | Attending: Emergency Medicine | Admitting: Emergency Medicine

## 2015-01-11 ENCOUNTER — Encounter (HOSPITAL_COMMUNITY): Payer: Self-pay | Admitting: Nurse Practitioner

## 2015-01-11 ENCOUNTER — Emergency Department (HOSPITAL_COMMUNITY): Payer: Medicare Other

## 2015-01-11 DIAGNOSIS — R109 Unspecified abdominal pain: Secondary | ICD-10-CM

## 2015-01-11 DIAGNOSIS — R1032 Left lower quadrant pain: Secondary | ICD-10-CM | POA: Insufficient documentation

## 2015-01-11 DIAGNOSIS — I252 Old myocardial infarction: Secondary | ICD-10-CM | POA: Diagnosis not present

## 2015-01-11 DIAGNOSIS — Z79899 Other long term (current) drug therapy: Secondary | ICD-10-CM | POA: Insufficient documentation

## 2015-01-11 DIAGNOSIS — E079 Disorder of thyroid, unspecified: Secondary | ICD-10-CM | POA: Insufficient documentation

## 2015-01-11 DIAGNOSIS — Z880 Allergy status to penicillin: Secondary | ICD-10-CM | POA: Insufficient documentation

## 2015-01-11 DIAGNOSIS — K5900 Constipation, unspecified: Secondary | ICD-10-CM | POA: Diagnosis not present

## 2015-01-11 DIAGNOSIS — J449 Chronic obstructive pulmonary disease, unspecified: Secondary | ICD-10-CM | POA: Diagnosis not present

## 2015-01-11 HISTORY — DX: Other congenital malformations of pancreas and pancreatic duct: Q45.3

## 2015-01-11 LAB — URINALYSIS, ROUTINE W REFLEX MICROSCOPIC
BILIRUBIN URINE: NEGATIVE
Glucose, UA: NEGATIVE mg/dL
Hgb urine dipstick: NEGATIVE
KETONES UR: NEGATIVE mg/dL
Leukocytes, UA: NEGATIVE
NITRITE: NEGATIVE
PROTEIN: NEGATIVE mg/dL
Specific Gravity, Urine: 1.008 (ref 1.005–1.030)
UROBILINOGEN UA: 0.2 mg/dL (ref 0.0–1.0)
pH: 7 (ref 5.0–8.0)

## 2015-01-11 LAB — COMPREHENSIVE METABOLIC PANEL
ALT: 17 U/L (ref 14–54)
AST: 27 U/L (ref 15–41)
Albumin: 3.7 g/dL (ref 3.5–5.0)
Alkaline Phosphatase: 53 U/L (ref 38–126)
Anion gap: 10 (ref 5–15)
BILIRUBIN TOTAL: 0.5 mg/dL (ref 0.3–1.2)
BUN: 20 mg/dL (ref 6–20)
CO2: 28 mmol/L (ref 22–32)
Calcium: 9.9 mg/dL (ref 8.9–10.3)
Chloride: 102 mmol/L (ref 101–111)
Creatinine, Ser: 0.7 mg/dL (ref 0.44–1.00)
GFR calc Af Amer: >60 mL/min (ref >60)
GFR calc non Af Amer: >60 mL/min (ref >60)
Glucose, Bld: 96 mg/dL (ref 65–99)
POTASSIUM: 4 mmol/L (ref 3.5–5.1)
Sodium: 140 mmol/L (ref 135–145)
TOTAL PROTEIN: 7 g/dL (ref 6.5–8.1)

## 2015-01-11 LAB — CBC
HEMATOCRIT: 41.8 % (ref 36.0–46.0)
Hemoglobin: 13.6 g/dL (ref 12.0–15.0)
MCH: 27.7 pg (ref 26.0–34.0)
MCHC: 32.5 g/dL (ref 30.0–36.0)
MCV: 85.1 fL (ref 78.0–100.0)
PLATELETS: 270 10*3/uL (ref 150–400)
RBC: 4.91 MIL/uL (ref 3.87–5.11)
RDW: 13.5 % (ref 11.5–15.5)
WBC: 7 10*3/uL (ref 4.0–10.5)

## 2015-01-11 LAB — LIPASE, BLOOD: LIPASE: 40 U/L (ref 22–51)

## 2015-01-11 IMAGING — CR RIGHT FOOT COMPLETE - 3+ VIEW
1 series · 3 of 3 positions shown · non-contrast
Comparison: none

REASON FOR EXAM: foot pain
COMMENTS:

[Series 1: ap · 0.17mm/px · 3 of 3 slices shown]
[im 1/3]
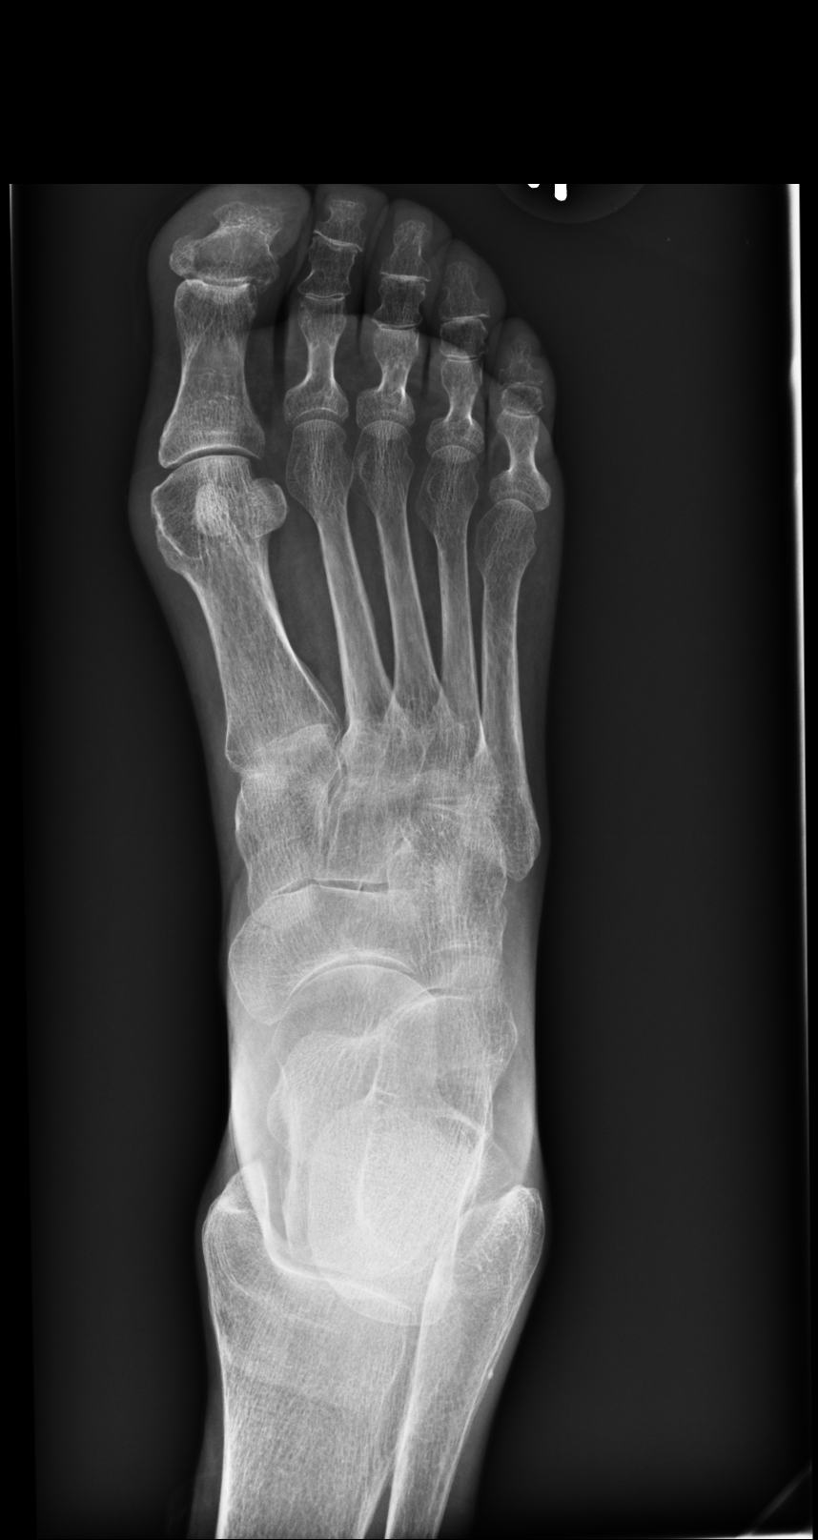
[im 2/3]
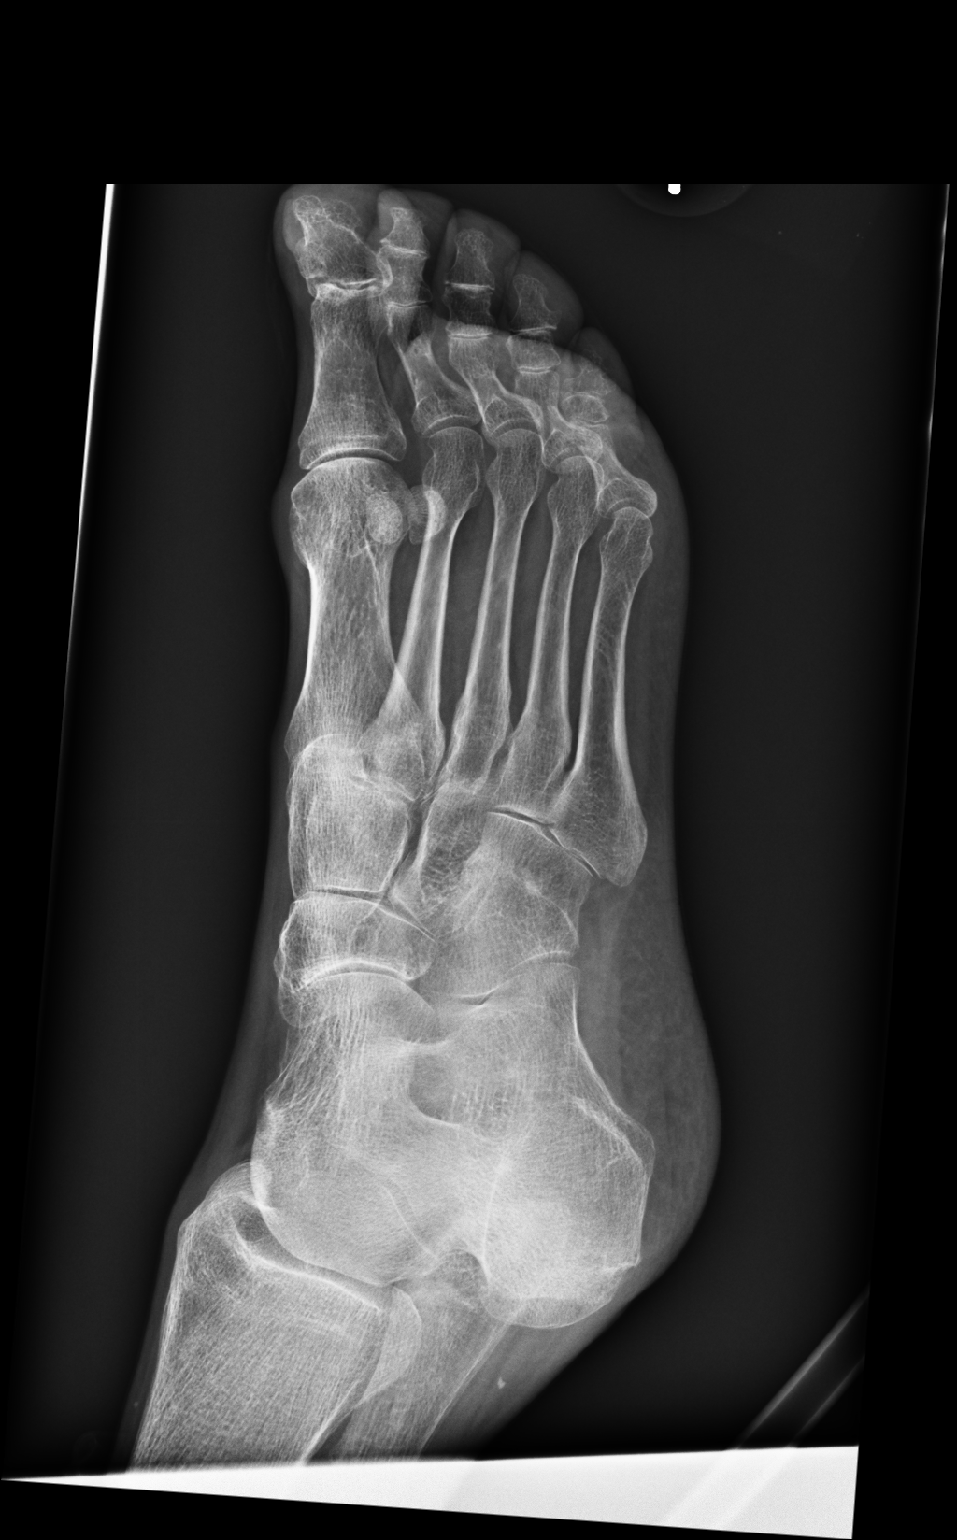
[im 3/3]
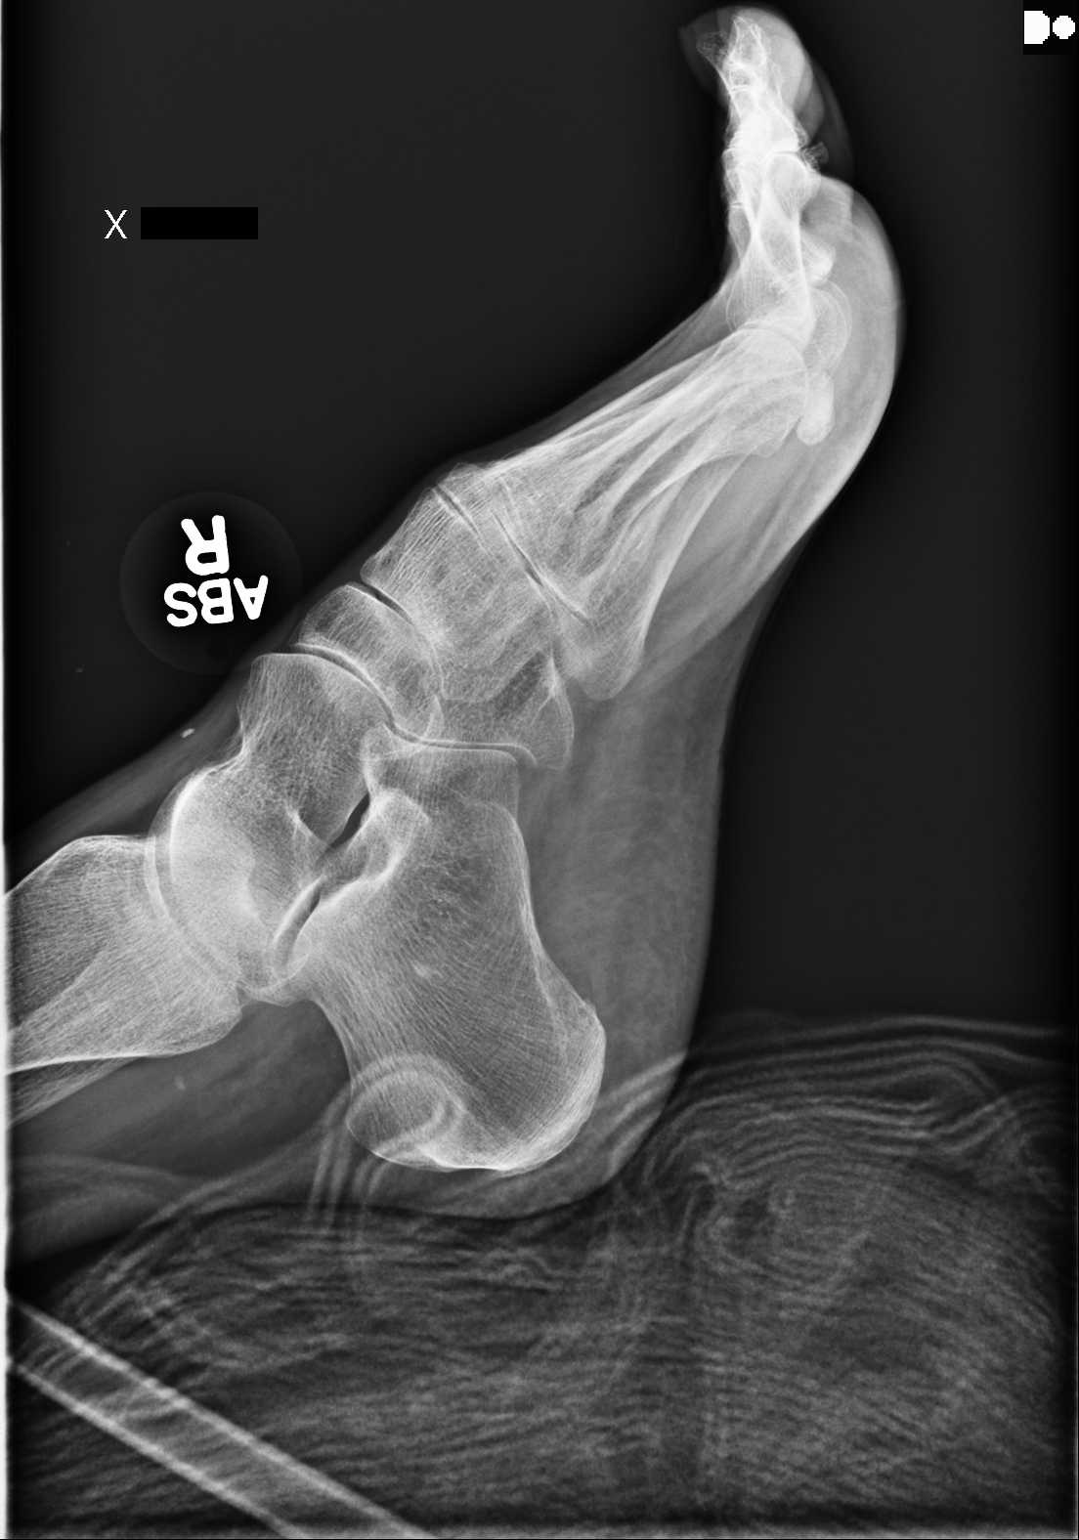

[3 of 3 positions shown; findings below may reference images not displayed]

PROCEDURE:     DXR - DXR FOOT RT COMPLETE W/OBLIQUES  - January 09, 2013  [DATE]

RESULT:     Right foot images demonstrate degenerative changes in the first
metatarsal phalangeal joint and enter phalangeal joint of the great toe and
an inner phalangeal joints of the other digits as well. There is underlying
osteopenia. Intertarsal degenerative changes are seen. Tarsometatarsal
degenerative change is evident. There is no definite fracture or dislocation.
IMPRESSION: Degenerative changes are present as described. No acute
bony abnormality is evident.

[REDACTED]

## 2015-01-11 MED ORDER — DICYCLOMINE HCL 20 MG PO TABS: 20.0000 mg | ORAL_TABLET | Freq: Two times a day (BID) | ORAL | Status: DC

## 2015-01-11 MED ORDER — IOHEXOL 300 MG/ML  SOLN
25.0000 mL | INTRAMUSCULAR | Status: AC
  Administered 2015-01-11 (×2): 25 mL via ORAL

## 2015-01-11 MED ORDER — IOHEXOL 300 MG/ML  SOLN
80.0000 mL | Freq: Once | INTRAMUSCULAR | Status: AC | PRN
Start: 2015-01-11 — End: 2015-01-11
  Administered 2015-01-11: 80 mL via INTRAVENOUS

## 2015-01-11 NOTE — ED Notes (Signed)
She c/o LLQ pain sometimes radiating into her entire abdomen constant over past week. She went to her PCP Wednesday, they wanted her to try miralax but that did not help. She reports 3 week history of pencil thin stools. Denies any blood in stool or black tarry stool. She denies any n/v/urinary changes.

## 2015-01-11 NOTE — ED Provider Notes (Signed)
CSN: [id number]     Arrival date & time 01/11/15  1203 History   First MD Initiated Contact with Patient 01/11/15 1235     Chief Complaint  Patient presents with  . Abdominal Pain    HPI   Shelia Fernandez is a 80 y.o. female with a PMH of COPD, GERD who presents to the ED with LLQ abdominal pain. She reports she has had pain over the past 3 weeks, though states her symptoms worsened this week. She reports her stools have been smaller and thinner than usual, though she denies melena or hematochezia. She states she was seen by her PCP Wednesday, who recommended she follow-up with GI for possible CT and start miralax, which she states has not helped. She denies fever, chills, chest pain, shortness of breath, nausea, vomiting, diarrhea, dysuria, urgency, frequency.    Past Medical History  Diagnosis Date  . Cataract   . COPD (chronic obstructive pulmonary disease) ([place])   . Myocardial infarction ([place])   . Thyroid disease     Hypothyroidism  . Osteoporosis     R hip fracture; L hip fracture.  Intolerance to bisphosphonates; GI upset.  Took bisphosphonates x 10 years; last bisphosphonates 2000.  s/p L wrist fracture 2001.  Marland Kitchen Benign positional vertigo     s/p ENT consultation with Eply maneuvers 2014.  [facility].  Marland Kitchen GERD (gastroesophageal reflux disease)   . Diarrhea   . Pancreas anomaly, congenital    Past Surgical History  Procedure Laterality Date  . Appendectomy    . Tubal ligation    . Ent consult      +BPV; Eply maneuvers wtih improvement.  Vaught/ENT.  . Colonoscopy      x 2; failure due to spasms.  [place]. [facility].  Marland Kitchen Fracture surgery      R hip fracture s/p ORIF; L hip fracture s/p ORIF.   Family History  Problem Relation Age of Onset  . Cancer Sister     Breast cancer; endometrial cancer; vaginal cancer  . COPD Sister   . Cancer Sister     Breast cancer  . Cancer Mother 68    cervical cancer  . COPD Father   . Cancer Sister     Melanoma   Social History   Substance Use Topics  . Smoking status: Never Smoker   . Smokeless tobacco: None  . Alcohol Use: No   OB History    No data available      Review of Systems  Constitutional: Negative for fever and chills.  Respiratory: Negative for shortness of breath.   Cardiovascular: Negative for chest pain.  Gastrointestinal: Positive for abdominal pain and constipation. Negative for nausea, vomiting, diarrhea and blood in stool.  Genitourinary: Negative for dysuria, urgency and frequency.  Musculoskeletal: Negative for back pain and neck pain.  Neurological: Negative for dizziness, syncope, weakness, light-headedness, numbness and headaches.  All other systems reviewed and are negative.     Allergies  Biaxin; Gluten meal; Lactose intolerance (gi); Sudafed; Penicillins; and Sulfa antibiotics  Home Medications   Prior to Admission medications   Medication Sig Start Date End Date Taking? Authorizing Provider  Calcium Citrate-Vitamin D (CALCIUM CITRATE + D PO) Take 1 tablet by mouth daily.    Historical Provider, MD  Ipratropium-Albuterol (COMBIVENT) 20-100 MCG/ACT AERS respimat Inhale 1 puff into the lungs every 6 (six) hours. 11/16/14   Wardell Honour, MD  Multiple Vitamin (MULTI-VITAMINS) TABS Take 1 tablet by mouth.  05/16/10  Historical Provider, MD  thyroid (ARMOUR THYROID) 30 MG tablet Take 1 tablet (30 mg total) by mouth daily before breakfast. 10/11/14   Wardell Honour, MD    BP 129/40 mmHg  Pulse 87  Temp(Src) 98.2 F (36.8 C) (Oral)  Resp 18  SpO2 96% Physical Exam  Constitutional: She is oriented to person, place, and time. She appears well-developed and well-nourished. No distress.  HENT:  Head: Normocephalic and atraumatic.  Right Ear: External ear normal.  Left Ear: External ear normal.  Nose: Nose normal.  Mouth/Throat: Uvula is midline, oropharynx is clear and moist and mucous membranes are normal.  Eyes: Conjunctivae, EOM and lids are normal. Pupils are equal,  round, and reactive to light. Right eye exhibits no discharge. Left eye exhibits no discharge. No scleral icterus.  Neck: Normal range of motion. Neck supple.  Cardiovascular: Normal rate, regular rhythm, normal heart sounds, intact distal pulses and normal pulses.   Pulmonary/Chest: Effort normal and breath sounds normal. No respiratory distress. She has no wheezes. She has no rales.  Abdominal: Soft. Normal appearance and bowel sounds are normal. She exhibits no distension and no mass. There is tenderness. There is no rigidity, no rebound and no guarding.  TTP of LLQ. No rebound, guarding, or palpable masses.  Musculoskeletal: Normal range of motion. She exhibits no edema or tenderness.  Neurological: She is alert and oriented to person, place, and time. She has normal strength. No sensory deficit.  Skin: Skin is warm, dry and intact. No rash noted. She is not diaphoretic. No erythema. No pallor.  Psychiatric: She has a normal mood and affect. Her speech is normal and behavior is normal. Judgment and thought content normal.  Nursing note and vitals reviewed.   ED Course  Procedures (including critical care time)  Labs Review Labs Reviewed  COMPREHENSIVE METABOLIC PANEL  CBC  URINALYSIS, ROUTINE W REFLEX MICROSCOPIC (NOT AT [facility])  LIPASE, BLOOD    Imaging Review Dg Abd 2 Views  01/10/2015   CLINICAL DATA:  Left lower quadrant abdominal pain, bloating and change in bowel habits.  EXAM: ABDOMEN - 2 VIEW  COMPARISON:  None.  FINDINGS: The lung bases are clear. The bowel gas pattern is unremarkable. There is scattered air and stool in the colon and scattered loops of small bowel with air but no distention. No free air. The soft tissue shadows are maintained. The bony structures are intact. Lumbar scoliosis and bilateral hip hardware noted.  IMPRESSION: No plain film findings for an acute abdominal process.   Electronically Signed   By: Marijo Sanes M.D.   On: 01/10/2015 09:06   I have  personally reviewed and evaluated these images and lab results as part of my medical decision-making.   EKG Interpretation None      MDM   Final diagnoses:  Abdominal pain  Abdominal pain    80 year old female presents with LLQ abdominal pain x 3 weeks, with worsening pain this week. Reports her stools have been smaller and thinner than usual,  denies melena or hematochezia. Was seen by her PCP Wednesday, who recommended GI follow-up and miralax. She denies fever, chills, chest pain, shortness of breath, nausea, vomiting, diarrhea, dysuria, urgency, frequency.   Patient is afebrile. Vital signs stable. Heart RRR. Lungs clear to auscultation bilaterally. Abdomen soft, non-distended, with TTP in LLQ. No rebound, guarding, or palpable masses. No lower extremity edema.   Patient declines pain medication.  CBC negative for leukocytosis. CMP, lipase within normal limits. UA  no evidence of infection. CT abdomen pelvis pending.  Patient signed out to Etta Quill, NP at shift change. Dispo pending imaging. If negative, feel patient is stable for discharge with GI follow-up and can continue miralax.  BP 110/52 mmHg  Pulse 70  Temp(Src) 98.2 F (36.8 C) (Oral)  Resp 18  SpO2 96%     Marella Chimes, PA-C 01/11/15 [place], MD 01/15/15 [phone]

## 2015-01-11 NOTE — ED Notes (Signed)
NP at bedside.

## 2015-01-11 NOTE — Discharge Instructions (Signed)
Abdominal Pain, Adult Many things can cause abdominal pain. Usually, abdominal pain is not caused by a disease and will improve without treatment. It can often be observed and treated at home. Your health care provider will do a physical exam and possibly order blood tests and X-rays to help determine the seriousness of your pain. However, in many cases, more time must pass before a clear cause of the pain can be found. Before that point, your health care provider may not know if you need more testing or further treatment. HOME CARE INSTRUCTIONS Monitor your abdominal pain for any changes. The following actions may help to alleviate any discomfort you are experiencing:  Only take over-the-counter or prescription medicines as directed by your health care provider.  Do not take laxatives unless directed to do so by your health care provider.  Try a clear liquid diet (broth, tea, or water) as directed by your health care provider. Slowly move to a bland diet as tolerated. SEEK MEDICAL CARE IF:  You have unexplained abdominal pain.  You have abdominal pain associated with nausea or diarrhea.  You have pain when you urinate or have a bowel movement.  You experience abdominal pain that wakes you in the night.  You have abdominal pain that is worsened or improved by eating food.  You have abdominal pain that is worsened with eating fatty foods.  You have a fever. SEEK IMMEDIATE MEDICAL CARE IF:  Your pain does not go away within 2 hours.  You keep throwing up (vomiting).  Your pain is felt only in portions of the abdomen, such as the right side or the left lower portion of the abdomen.  You pass bloody or black tarry stools. MAKE SURE YOU:  Understand these instructions.  Will watch your condition.  Will get help right away if you are not doing well or get worse.   This information is not intended to replace advice given to you by your health care provider. Make sure you discuss  any questions you have with your health care provider.   Document Released: 12/31/2004 Document Revised: 12/12/2014 Document Reviewed: 11/30/2012 Elsevier Interactive Patient Education 2016 [place].  TRIAL THE [facility] FOR ABDOMINAL SPASM.  CONTACT YOUR PCP TO DETERMINE THE PROGRESS OF YOUR GI REFERRAL.  YOU WILL ALSO FIND LISTED A REFERRAL TO THE LOCAL ON-CALL GASTROENTEROLOGIST.

## 2015-01-17 NOTE — Telephone Encounter (Signed)
Form signed and placed in lab on 01/16/15.Shelia Fernandez

## 2015-01-18 NOTE — Telephone Encounter (Signed)
Faxed yesterday

## 2015-04-05 ENCOUNTER — Other Ambulatory Visit: Payer: Self-pay

## 2015-04-05 NOTE — Telephone Encounter (Signed)
Pharm faxed req for a rescue inh for pt asap, Proair is what pt has been Rxd prev by another provider. Dr Tamala Julian refilled her combivent and discussed asthma at 11/2014 OV, but we have not Rxd albuterol inh before. Please send in if OK.

## 2015-04-06 MED ORDER — ALBUTEROL SULFATE HFA 108 (90 BASE) MCG/ACT IN AERS: 2.0000 | INHALATION_SPRAY | Freq: Four times a day (QID) | RESPIRATORY_TRACT | Status: DC | PRN

## 2015-05-15 ENCOUNTER — Telehealth: Payer: Self-pay | Admitting: Family Medicine

## 2015-05-21 ENCOUNTER — Ambulatory Visit (INDEPENDENT_AMBULATORY_CARE_PROVIDER_SITE_OTHER): Payer: Medicare Other | Admitting: Family Medicine

## 2015-05-21 ENCOUNTER — Encounter: Payer: Self-pay | Admitting: Family Medicine

## 2015-05-21 VITALS — BP 117/66 | HR 76 | Temp 97.7°F | Resp 16 | Ht 61.5 in | Wt 93.0 lb

## 2015-05-21 DIAGNOSIS — Z0000 Encounter for general adult medical examination without abnormal findings: Secondary | ICD-10-CM

## 2015-05-21 DIAGNOSIS — E7800 Pure hypercholesterolemia, unspecified: Secondary | ICD-10-CM | POA: Diagnosis not present

## 2015-05-21 DIAGNOSIS — E038 Other specified hypothyroidism: Secondary | ICD-10-CM

## 2015-05-21 DIAGNOSIS — K219 Gastro-esophageal reflux disease without esophagitis: Secondary | ICD-10-CM | POA: Diagnosis not present

## 2015-05-21 DIAGNOSIS — J431 Panlobular emphysema: Secondary | ICD-10-CM | POA: Diagnosis not present

## 2015-05-21 DIAGNOSIS — R636 Underweight: Secondary | ICD-10-CM

## 2015-05-21 DIAGNOSIS — E034 Atrophy of thyroid (acquired): Secondary | ICD-10-CM | POA: Diagnosis not present

## 2015-05-21 DIAGNOSIS — Z23 Encounter for immunization: Secondary | ICD-10-CM

## 2015-05-21 DIAGNOSIS — M810 Age-related osteoporosis without current pathological fracture: Secondary | ICD-10-CM | POA: Diagnosis not present

## 2015-05-21 LAB — POCT URINALYSIS DIP (MANUAL ENTRY)
BILIRUBIN UA: NEGATIVE
BILIRUBIN UA: NEGATIVE
Glucose, UA: NEGATIVE
Nitrite, UA: NEGATIVE
PROTEIN UA: NEGATIVE
SPEC GRAV UA: 1.015
Urobilinogen, UA: 0.2
pH, UA: 7

## 2015-05-21 LAB — CBC WITH DIFFERENTIAL/PLATELET
Basophils Absolute: 0.1 10*3/uL (ref 0.0–0.1)
Basophils Relative: 1 % (ref 0–1)
EOS ABS: 0.1 10*3/uL (ref 0.0–0.7)
Eosinophils Relative: 1 % (ref 0–5)
HEMATOCRIT: 41.2 % (ref 36.0–46.0)
HEMOGLOBIN: 14 g/dL (ref 12.0–15.0)
LYMPHS ABS: 1.7 10*3/uL (ref 0.7–4.0)
LYMPHS PCT: 27 % (ref 12–46)
MCH: 28.2 pg (ref 26.0–34.0)
MCHC: 34 g/dL (ref 30.0–36.0)
MCV: 82.9 fL (ref 78.0–100.0)
MONOS PCT: 8 % (ref 3–12)
MPV: 9.8 fL (ref 8.6–12.4)
Monocytes Absolute: 0.5 10*3/uL (ref 0.1–1.0)
NEUTROS PCT: 63 % (ref 43–77)
Neutro Abs: 3.9 10*3/uL (ref 1.7–7.7)
PLATELETS: 275 10*3/uL (ref 150–400)
RBC: 4.97 MIL/uL (ref 3.87–5.11)
RDW: 14.1 % (ref 11.5–15.5)
WBC: 6.2 10*3/uL (ref 4.0–10.5)

## 2015-05-21 LAB — COMPREHENSIVE METABOLIC PANEL
ALBUMIN: 4.2 g/dL (ref 3.6–5.1)
ALK PHOS: 58 U/L (ref 33–130)
ALT: 19 U/L (ref 6–29)
AST: 26 U/L (ref 10–35)
BUN: 25 mg/dL (ref 7–25)
CALCIUM: 9.7 mg/dL (ref 8.6–10.4)
CHLORIDE: 103 mmol/L (ref 98–110)
CO2: 26 mmol/L (ref 20–31)
Creat: 0.67 mg/dL (ref 0.60–0.93)
Glucose, Bld: 91 mg/dL (ref 65–99)
POTASSIUM: 4.2 mmol/L (ref 3.5–5.3)
Sodium: 137 mmol/L (ref 135–146)
TOTAL PROTEIN: 7.5 g/dL (ref 6.1–8.1)
Total Bilirubin: 0.5 mg/dL (ref 0.2–1.2)

## 2015-05-21 LAB — LIPID PANEL
CHOL/HDL RATIO: 2.3 ratio (ref <=5.0)
CHOLESTEROL: 253 mg/dL — AB (ref 125–200)
HDL: 112 mg/dL (ref >=46)
LDL Cholesterol: 122 mg/dL (ref <130)
TRIGLYCERIDES: 96 mg/dL (ref <150)
VLDL: 19 mg/dL (ref <30)

## 2015-05-21 LAB — TSH: TSH: 0.69 mIU/L

## 2015-05-21 LAB — T4, FREE: Free T4: 0.9 ng/dL (ref 0.8–1.8)

## 2015-05-21 NOTE — Progress Notes (Signed)
Subjective:    Patient ID: MORAIMA BURD, female    DOB: 1934-07-17, 81 y.o.   MRN: [id number]  05/21/2015  Annual Exam and Medication Refill   HPI This 81 y.o. female presents for Annual Wellness Examination.  Last physical:  05-14-14 Pap smear: n/a due to age.  2016. [facility]; scheduled in April 2017. Mammogram:  07/2014 [facility] Colonoscopy:  Never; stool kit yearly with [facility]; completed Cologuard.   Bone density:  08-22-14 [facility].  Fosamax in past; forteo x 2 years.   TDAP:  2008 Pneumovax:  Prevnar 2015; Pneumovax 2008 Zostavax:  Influenza:  2016 Eye exam:  +glasses; Dingeldein.  Early cataracts.  Scheduled 08/2015.   Dental exam:  [place] yearly.  2017.   COPD:  Pulmonologist once yearly at [facility]  Using Albuterol sparingly; Combivent qid scheduled.    Osteoporosis:  Calcium bid.  Could not tolerate Fosamax due to GI upset.  Cannot take Reclast but does not remember why.  Forteo x 2 cycles.  Evista in past; not sure why could not tolerate.  Bone density declined while taking all bisphosphonates.    IBS: not taking; never remembers taking medication.  Taking Miralax PRN.  Saw [facility]; no intervention or evaluation; knows having gallbladder pain; did not recommend surgery.  Hypothyroidism: taking daily.    GERD: takes acid reducer from [employer] PRN.    Review of Systems  Constitutional: Negative for fever, chills, diaphoresis, activity change, appetite change, fatigue and unexpected weight change.  HENT: Negative for congestion, dental problem, drooling, ear discharge, ear pain, facial swelling, hearing loss, mouth sores, nosebleeds, postnasal drip, rhinorrhea, sinus pressure, sneezing, sore throat, tinnitus, trouble swallowing and voice change.   Eyes: Negative for photophobia, pain, discharge, redness, itching and visual disturbance.  Respiratory: Negative for apnea, cough, choking, chest tightness, shortness of breath, wheezing and stridor.   Cardiovascular: Negative for  chest pain, palpitations and leg swelling.  Gastrointestinal: Positive for abdominal pain. Negative for nausea, vomiting, diarrhea, constipation, blood in stool, abdominal distention, anal bleeding and rectal pain.  Endocrine: Negative for cold intolerance, heat intolerance, polydipsia, polyphagia and polyuria.  Genitourinary: Negative for dysuria, urgency, frequency, hematuria, flank pain, decreased urine volume, vaginal bleeding, vaginal discharge, enuresis, difficulty urinating, genital sores, vaginal pain, menstrual problem, pelvic pain and dyspareunia.       Nocturia x 1; NO URINARY INCONTINENCE.  Musculoskeletal: Negative for myalgias, back pain, joint swelling, arthralgias, gait problem, neck pain and neck stiffness.  Skin: Negative for color change, pallor, rash and wound.  Allergic/Immunologic: Negative for environmental allergies, food allergies and immunocompromised state.  Neurological: Negative for dizziness, tremors, seizures, syncope, facial asymmetry, speech difficulty, weakness, light-headedness, numbness and headaches.  Hematological: Negative for adenopathy. Does not bruise/bleed easily.  Psychiatric/Behavioral: Negative for suicidal ideas, hallucinations, behavioral problems, confusion, sleep disturbance, self-injury, dysphoric mood, decreased concentration and agitation. The patient is not nervous/anxious and is not hyperactive.        Bedtime 10:00pm; wakes up 7:00am.      Past Medical History  Diagnosis Date  . Cataract   . COPD (chronic obstructive pulmonary disease) ([place])   . Myocardial infarction ([place])   . Thyroid disease     Hypothyroidism  . Osteoporosis     R hip fracture; L hip fracture.  Intolerance to bisphosphonates; GI upset.  Took bisphosphonates x 10 years; last bisphosphonates 2000.  s/p L wrist fracture 2001.  Marland Kitchen Benign positional vertigo     s/p ENT consultation with Eply maneuvers 2014.  Vaught.  Marland Kitchen  GERD (gastroesophageal reflux disease)   . Diarrhea     . Pancreas anomaly, congenital    Past Surgical History  Procedure Laterality Date  . Appendectomy    . Tubal ligation    . Ent consult      +BPV; Eply maneuvers wtih improvement.  Vaught/ENT.  . Colonoscopy      x 2; failure due to spasms.  Elliott. [facility].  Marland Kitchen Fracture surgery      R hip fracture s/p ORIF; L hip fracture s/p ORIF.   Allergies  Allergen Reactions  . Biaxin [Clarithromycin]     Had a strong a reaction to it (can't remember specifically)  . Gluten Meal     Stomach upset  . Lactose Intolerance (Gi) Nausea And Vomiting  . Sudafed [Pseudoephedrine Hcl]     Can't recall reaction  . Penicillins Hives and Rash  . Sulfa Antibiotics Hives and Rash   Current Outpatient Prescriptions  Medication Sig Dispense Refill  . albuterol (PROVENTIL HFA;VENTOLIN HFA) 108 (90 Base) MCG/ACT inhaler Inhale 2 puffs into the lungs every 6 (six) hours as needed for wheezing or shortness of breath. 1 Inhaler 2  . Calcium Citrate-Vitamin D (CALCIUM CITRATE + D PO) Take 1 tablet by mouth daily.    . Ipratropium-Albuterol (COMBIVENT) 20-100 MCG/ACT AERS respimat Inhale 1 puff into the lungs every 6 (six) hours. 4 g 11  . Multiple Vitamin (MULTI-VITAMINS) TABS Take 1 tablet by mouth.     . polyethylene glycol (MIRALAX / GLYCOLAX) packet Take 17 g by mouth daily as needed for mild constipation. Reported on 05/21/2015    . thyroid (ARMOUR THYROID) 30 MG tablet Take 1 tablet (30 mg total) by mouth daily before breakfast. 90 tablet 3   No current facility-administered medications for this visit.   Social History   Social History  . Marital Status: Married    Spouse Name: N/A  . Number of Children: 1  . Years of Education: N/A   Occupational History  . retired    Social History Main Topics  . Smoking status: Never Smoker   . Smokeless tobacco: Not on file  . Alcohol Use: No  . Drug Use: No  . Sexual Activity: Not Currently   Other Topics Concern  . Not on file   Social  History Narrative   Marital status: widowed x 1997 after 30+ years; not dating; not interested.       Children:  1 daughter; 2 granddaughters; no gg.      Lives:  With daughter [name]), son-in-law      Tobacco: never; husband smoked      Alcohol: none      Exercise:  Walking three to four days per week at [facility] for 30 minutes.      ADLs:  No assistant devices; has a cane for PRN use.  Drives; goes to grocery store; no cleaning; no bill paying; daughter pays bills.        Advanced Directives:  +LIVING WILL; +FULL CODE.  HCPOA: daughter(Shelley).       Volunteers at [facility] once per week; visits sister in [place] once weekly; plays bridge two days per week at neighbors or home; has small church group on Wednesdays; goes to church on Sundays.    Family History  Problem Relation Age of Onset  . Cancer Sister     Terminal cancer in 2017; Breast cancer; endometrial cancer; vaginal cancer  . COPD Sister   . Cancer Sister  Breast cancer  . Cancer Mother 4    cervical cancer  . COPD Father   . Cancer Sister     Melanoma       Objective:    BP 117/66 mmHg  Pulse 76  Temp(Src) 97.7 F (36.5 C) (Oral)  Resp 16  Ht 5' 1.5" (1.562 m)  Wt 93 lb (42.185 kg)  BMI 17.29 kg/m2  SpO2 97% Physical Exam  Constitutional: She is oriented to person, place, and time. She appears well-developed and well-nourished. No distress.  HENT:  Head: Normocephalic and atraumatic.  Right Ear: External ear normal.  Left Ear: External ear normal.  Nose: Nose normal.  Mouth/Throat: Oropharynx is clear and moist.  Eyes: Conjunctivae and EOM are normal. Pupils are equal, round, and reactive to light.  Neck: Normal range of motion and full passive range of motion without pain. Neck supple. No JVD present. Carotid bruit is not present. No thyromegaly present.  Cardiovascular: Normal rate, regular rhythm and normal heart sounds.  Exam reveals no gallop and no friction rub.   No murmur  heard. Pulmonary/Chest: Effort normal and breath sounds normal. She has no wheezes. She has no rales.  Abdominal: Soft. Bowel sounds are normal. She exhibits no distension and no mass. There is no tenderness. There is no rebound and no guarding.  Musculoskeletal:       Right shoulder: Normal.       Left shoulder: Normal.       Cervical back: Normal.  Lymphadenopathy:    She has no cervical adenopathy.  Neurological: She is alert and oriented to person, place, and time. She has normal reflexes. No cranial nerve deficit. She exhibits normal muscle tone. Coordination normal.  Skin: Skin is warm and dry. No rash noted. She is not diaphoretic. No erythema. No pallor.  Psychiatric: She has a normal mood and affect. Her behavior is normal. Judgment and thought content normal.  Nursing note and vitals reviewed.   Fall Risk  05/21/2015 05/14/2014  Falls in the past year? No No   Depression screen [facility] 2/9 05/21/2015 01/09/2015 11/16/2014 05/14/2014  Decreased Interest 0 0 0 0  Down, Depressed, Hopeless 0 0 0 0  PHQ - 2 Score 0 0 0 0   Functional Status Survey: Is the patient deaf or have difficulty hearing?: No Does the patient have difficulty seeing, even when wearing glasses/contacts?: No Does the patient have difficulty concentrating, remembering, or making decisions?: No Does the patient have difficulty walking or climbing stairs?: No Does the patient have difficulty dressing or bathing?: No Does the patient have difficulty doing errands alone such as visiting a doctor's office or shopping?: No     Assessment & Plan:   1. Encounter for Medicare annual wellness exam   2. Gastroesophageal reflux disease without esophagitis   3. Panlobular emphysema ([place])   4. Underweight   5. Osteoporosis   6. Hypothyroidism due to acquired atrophy of thyroid   7. Pure hypercholesterolemia   8. Need for prophylactic vaccination against Streptococcus pneumoniae (pneumococcus)     Orders Placed This  Encounter  Procedures  . Pneumococcal polysaccharide vaccine 23-valent greater than or equal to 2yo subcutaneous/IM  . CBC with Differential/Platelet  . Comprehensive metabolic panel    Order Specific Question:  Has the patient fasted?    Answer:  Yes  . Lipid panel    Order Specific Question:  Has the patient fasted?    Answer:  Yes  . T4, free  . TSH  .  VITAMIN D 25 Hydroxy (Vit-D Deficiency, Fractures)  . POCT urinalysis dipstick   Meds ordered this encounter  Medications  . thyroid (ARMOUR THYROID) 30 MG tablet    Sig: Take 1 tablet (30 mg total) by mouth daily before breakfast.    Dispense:  90 tablet    Refill:  3  . Ipratropium-Albuterol (COMBIVENT) 20-100 MCG/ACT AERS respimat    Sig: Inhale 1 puff into the lungs every 6 (six) hours.    Dispense:  4 g    Refill:  11    Return in about 1 year (around 05/20/2016) for complete physical examiniation.    Hartlee Amedee Elayne Guerin, M.D. [facility] [address] [place], [place]  [phone] phone [phone] fax

## 2015-05-22 LAB — VITAMIN D 25 HYDROXY (VIT D DEFICIENCY, FRACTURES): Vit D, 25-Hydroxy: 51 ng/mL (ref 30–100)

## 2015-05-23 NOTE — Telephone Encounter (Signed)
Patient states she has never taken dicyclomine (BENTYL) 20 MG tablet And wants it removed from her records.   States it is a mistake.   [phone]  Dr. Tamala Julian

## 2015-05-24 ENCOUNTER — Encounter: Payer: Self-pay | Admitting: Family Medicine

## 2015-05-24 NOTE — Telephone Encounter (Signed)
Okay to remove

## 2015-05-27 NOTE — Telephone Encounter (Signed)
Rx removed from record.

## 2015-05-27 NOTE — Addendum Note (Signed)
Addended by: Wardell Honour on: 05/27/2015 11:24 AM   Modules accepted: Orders, Medications

## 2015-07-05 MED ORDER — IPRATROPIUM-ALBUTEROL 20-100 MCG/ACT IN AERS: 1.0000 | INHALATION_SPRAY | Freq: Four times a day (QID) | RESPIRATORY_TRACT | Status: DC

## 2015-07-05 MED ORDER — THYROID 30 MG PO TABS: 30.0000 mg | ORAL_TABLET | Freq: Every day | ORAL | Status: DC

## 2015-09-19 ENCOUNTER — Ambulatory Visit: Payer: Medicare Other | Admitting: Internal Medicine

## 2015-09-26 ENCOUNTER — Ambulatory Visit (INDEPENDENT_AMBULATORY_CARE_PROVIDER_SITE_OTHER): Payer: Medicare Other | Admitting: Pulmonary Disease

## 2015-09-26 ENCOUNTER — Encounter: Payer: Self-pay | Admitting: Pulmonary Disease

## 2015-09-26 VITALS — BP 108/58 | HR 83 | Ht 61.0 in | Wt 94.8 lb

## 2015-09-26 DIAGNOSIS — J449 Chronic obstructive pulmonary disease, unspecified: Secondary | ICD-10-CM

## 2015-09-26 DIAGNOSIS — R0600 Dyspnea, unspecified: Secondary | ICD-10-CM

## 2015-09-26 NOTE — Patient Instructions (Signed)
Continue Combivent - one inhalation four times per day Follow up in 4-6 weeks after chest Xray and lung function tests

## 2015-09-26 NOTE — Progress Notes (Signed)
PULMONARY CONSULT NOTE  Requesting MD/Service: Self referred Date of initial consultation: 09/26/15 Reason for consultation: Dyspnea, prior dx of COPD (previously managed @ [facility])  PT PROFILE: 81 y.o. never smoker with significant second hand smoke exposure and previously diagnosed with COPD @ [place], self-referred for dyspnea which manifests mostly when she overeats.   HPI:  As above. She otherwise has excellent exercise tolerance and walks up to a mile or more daily. She has been maintained on Combivent inhaler - one actuation QID - for many years. She has a rescue inhaler that she rarely uses. There is little DTD variation and no seasonal variation in her symptoms.Denies CP, fever, purulent sputum, hemoptysis, LE edema and calf tenderness. Her main concern is post prandial dyspnea which she attributes to a GB problem. She has been seen by Surgery @ [facility] and they did not offer any surgical intervention.   Past Medical History  Diagnosis Date  . Cataract   . COPD (chronic obstructive pulmonary disease) ([place])   . Myocardial infarction ([place])   . Thyroid disease     Hypothyroidism  . Osteoporosis     R hip fracture; L hip fracture.  Intolerance to bisphosphonates; GI upset.  Took bisphosphonates x 10 years; last bisphosphonates 2000.  s/p L wrist fracture 2001.  Marland Kitchen Benign positional vertigo     s/p ENT consultation with Eply maneuvers 2014.  [facility].  Marland Kitchen GERD (gastroesophageal reflux disease)   . Diarrhea   . Pancreas anomaly, congenital     Past Surgical History  Procedure Laterality Date  . Appendectomy    . Tubal ligation    . Ent consult      +BPV; Eply maneuvers wtih improvement.  Vaught/ENT.  . Colonoscopy      x 2; failure due to spasms.  [facility]ott. [facility].  Marland Kitchen Fracture surgery      R hip fracture s/p ORIF; L hip fracture s/p ORIF.    MEDICATIONS: I have reviewed all medications and confirmed regimen as documented  Social History   Social History  . Marital Status:  Married    Spouse Name: N/A  . Number of Children: 1  . Years of Education: N/A   Occupational History  . retired    Social History Main Topics  . Smoking status: Passive Smoke Exposure - Never Smoker  . Smokeless tobacco: Never Used     Comment: pt's father and husband both smoked in house.   Marland Kitchen Alcohol Use: No  . Drug Use: No  . Sexual Activity: Not Currently   Other Topics Concern  . Not on file   Social History Narrative   Marital status: widowed x 1997 after 30+ years; not dating; not interested.       Children:  1 daughter; 2 granddaughters; no gg.      Lives:  With daughter [name]), son-in-law      Tobacco: never; husband smoked      Alcohol: none      Exercise:  Walking three to four days per week at [facility] for 30 minutes.      ADLs:  No assistant devices; has a cane for PRN use.  Drives; goes to grocery store; no cleaning; no bill paying; daughter pays bills.        Advanced Directives:  +LIVING WILL; +FULL CODE.  HCPOA: daughter(Shelley).       Volunteers at [facility] once per week; visits sister in [place] once weekly; plays bridge two days per week at neighbors  or home; has small church group on Wednesdays; goes to church on Sundays.     Family History  Problem Relation Age of Onset  . Cancer Sister     Terminal cancer in 2017; Breast cancer; endometrial cancer; vaginal cancer  . COPD Sister   . Cancer Sister     Breast cancer  . Cancer Mother 71    cervical cancer  . COPD Father   . Cancer Sister     Melanoma  . Asthma Mother     ROS: No fever, myalgias/arthralgias, unexplained weight loss or weight gain No new focal weakness or sensory deficits No otalgia, hearing loss, visual changes, nasal and sinus symptoms, mouth and throat problems No neck pain or adenopathy No abdominal pain, N/V/D, diarrhea, change in bowel pattern No dysuria, change in urinary pattern   Filed Vitals:   09/26/15 0943  BP: 108/58  Pulse: 83  Height: 5\' 1"  (1.549  m)  Weight: 94 lb 12.8 oz (43.001 kg)  SpO2: 97%     EXAM:  Gen: Thin, not cachectic, No overt respiratory distress HEENT: NCAT, sclera white, oropharynx normal Neck: Supple without LAN, thyromegaly, JVD Lungs: breath sounds: mildly decreased, percussion: normal, No wheezes Cardiovascular: RRR, no murmurs noted Abdomen: Soft, nontender, normal BS Ext: without clubbing, cyanosis, edema Neuro: CNs grossly intact, motor and sensory intact Skin: Limited exam, no lesions noted  DATA:   BMP Latest Ref Rng 05/21/2015 01/11/2015 01/09/2015  Glucose 65 - 99 mg/dL 91 96 93  BUN 7 - 25 mg/dL 25 20 23   Creatinine 0.60 - 0.93 mg/dL 0.67 0.70 0.72  Sodium 135 - 146 mmol/L 137 140 140  Potassium 3.5 - 5.3 mmol/L 4.2 4.0 4.3  Chloride 98 - 110 mmol/L 103 102 103  CO2 20 - 31 mmol/L 26 28 30   Calcium 8.6 - 10.4 mg/dL 9.7 9.9 9.9    CBC Latest Ref Rng 05/21/2015 01/11/2015 01/09/2015  WBC 4.0 - 10.5 K/uL 6.2 7.0 6.9  Hemoglobin 12.0 - 15.0 g/dL 14.0 13.6 12.6  Hematocrit 36.0 - 46.0 % 41.2 41.8 41.0  Platelets 150 - 400 K/uL 275 270 -    CXR (08/03/14):  Chronic R apical calcification, NAD  IMPRESSION:     ICD-9-CM ICD-10-CM   1. Chronic obstructive pulmonary disease, unspecified COPD type ([place]) 496 J44.9 DG Chest 2 View  2. Dyspnea 786.09 R06.00 Pulmonary function test  3. Abnormal CXR - consistent with old granulomatous disease   PLAN:  Continue Combivent MDI as previously ROV 4-6 weeks with PFTs, CXR   Merton Border, MD PCCM service Mobile [phone] Pager [phone] 09/26/2015

## 2015-10-01 ENCOUNTER — Ambulatory Visit
Admission: RE | Admit: 2015-10-01 | Discharge: 2015-10-01 | Disposition: A | Discharge status: Home / Self Care | Payer: Medicare Other | Source: Ambulatory Visit | Attending: Pulmonary Disease | Admitting: Pulmonary Disease

## 2015-10-01 DIAGNOSIS — J449 Chronic obstructive pulmonary disease, unspecified: Secondary | ICD-10-CM | POA: Diagnosis not present

## 2015-10-01 DIAGNOSIS — I700 Atherosclerosis of aorta: Secondary | ICD-10-CM | POA: Insufficient documentation

## 2015-10-21 ENCOUNTER — Ambulatory Visit (INDEPENDENT_AMBULATORY_CARE_PROVIDER_SITE_OTHER): Payer: Medicare Other | Admitting: *Deleted

## 2015-10-21 DIAGNOSIS — R0600 Dyspnea, unspecified: Secondary | ICD-10-CM | POA: Diagnosis not present

## 2015-10-21 LAB — PULMONARY FUNCTION TEST
DL/VA % PRED: 81 %
DL/VA: 3.62 ml/min/mmHg/L
DLCO UNC % PRED: 52 %
DLCO UNC: 10.99 ml/min/mmHg
FEF 25-75 Post: 0.57 L/sec
FEF 25-75 Pre: 0.45 L/sec
FEF2575-%CHANGE-POST: 26 %
FEF2575-%PRED-PRE: 33 %
FEF2575-%Pred-Post: 43 %
FEV1-%Change-Post: 10 %
FEV1-%Pred-Post: 62 %
FEV1-%Pred-Pre: 56 %
FEV1-Post: 1.09 L
FEV1-Pre: 0.99 L
FEV1FVC-%CHANGE-POST: 11 %
FEV1FVC-%Pred-Pre: 74 %
FEV6-%CHANGE-POST: 0 %
FEV6-%PRED-PRE: 80 %
FEV6-%Pred-Post: 80 %
FEV6-POST: 1.78 L
FEV6-PRE: 1.8 L
FEV6FVC-%Change-Post: 0 %
FEV6FVC-%PRED-POST: 106 %
FEV6FVC-%PRED-PRE: 106 %
FVC-%Change-Post: 0 %
FVC-%PRED-POST: 75 %
FVC-%PRED-PRE: 76 %
FVC-POST: 1.78 L
FVC-PRE: 1.8 L
POST FEV6/FVC RATIO: 100 %
PRE FEV1/FVC RATIO: 55 %
Post FEV1/FVC ratio: 61 %
Pre FEV6/FVC Ratio: 100 %
RV % PRED: 125 %
RV: 2.81 L
TLC % PRED: 95 %
TLC: 4.47 L

## 2015-10-21 NOTE — Progress Notes (Signed)
PFT performed today. 

## 2015-11-01 ENCOUNTER — Encounter: Payer: Self-pay | Admitting: Pulmonary Disease

## 2015-11-01 ENCOUNTER — Ambulatory Visit (INDEPENDENT_AMBULATORY_CARE_PROVIDER_SITE_OTHER): Payer: Medicare Other | Admitting: Pulmonary Disease

## 2015-11-01 VITALS — BP 98/58 | HR 80 | Ht 61.5 in | Wt 94.0 lb

## 2015-11-01 DIAGNOSIS — J449 Chronic obstructive pulmonary disease, unspecified: Secondary | ICD-10-CM

## 2015-11-01 NOTE — Progress Notes (Signed)
PULMONARY OFFICE FOLLOW UP NOTE  Requesting MD/Service: Self referred Date of initial consultation: 09/26/15 Reason for consultation: Dyspnea, prior dx of COPD (previously managed @ [facility])  PT PROFILE: 81 y.o. never smoker with significant second hand smoke exposure and previously diagnosed with COPD @ [place], self-referred for dyspnea which manifests mostly when she overeats.   DATA: 10/21/15 PFTs: moderate obstruction, normal lung volumes, moderate decrease in DLCO 10/01/15 CXR: Moderate kyphosis, mild hyperinflation, NACPD  SUBJ: No new complaints. No distress. Denies CP, fever, purulent sputum, hemoptysis, LE edema and calf tenderness   OBJ:  Vitals:   11/01/15 1032  BP: (!) 98/58  Pulse: 80  SpO2: 96%  Weight: 94 lb (42.6 kg)  Height: 5' 1.5" (1.562 m)     EXAM:  Gen: Thin, No overt respiratory distress HEENT: NCAT, sclera white Neck: Supple without LAN, thyromegaly, JVD Lungs: breath sounds mildly decreased, percussion normal, No wheezes Cardiovascular: RRR, no murmurs noted Abdomen: Soft, nontender, normal BS Ext: without clubbing, cyanosis, edema Neuro: grossly intact  DATA:   BMP Latest Ref Rng & Units 05/21/2015 01/11/2015 01/09/2015  Glucose 65 - 99 mg/dL 91 96 93  BUN 7 - 25 mg/dL 25 20 23   Creatinine 0.60 - 0.93 mg/dL 0.67 0.70 0.72  Sodium 135 - 146 mmol/L 137 140 140  Potassium 3.5 - 5.3 mmol/L 4.2 4.0 4.3  Chloride 98 - 110 mmol/L 103 102 103  CO2 20 - 31 mmol/L 26 28 30   Calcium 8.6 - 10.4 mg/dL 9.7 9.9 9.9    CBC Latest Ref Rng & Units 05/21/2015 01/11/2015 01/09/2015  WBC 4.0 - 10.5 K/uL 6.2 7.0 6.9  Hemoglobin 12.0 - 15.0 g/dL 14.0 13.6 12.6  Hematocrit 36.0 - 46.0 % 41.2 41.8 41.0  Platelets 150 - 400 K/uL 275 270 -    CXR: as above  IMPRESSION:     ICD-9-CM ICD-10-CM   1. Chronic obstructive pulmonary disease, unspecified COPD type ([place]) 496 J44.9   2. Abnormal CXR - consistent with old granulomatous disease   PLAN:  We discussed  changing her from combivent to [facility] but she prefers to continue to use what she knows works for her. She has had some difficulty tolerating meds - Spiriva caused constipation, for example. Therefore, continue Combivent MDI as previously - one inhalation 4 times a day.  ROV 6 months or PRN I reminded her to get flu vaccination this autumn   Merton Border, MD PCCM service Mobile [phone] Pager [phone] 11/03/2015

## 2015-11-01 NOTE — Patient Instructions (Signed)
Continue Combivent - One inhalation 4 times per day You need to get flu vaccine this fall Follow up in 6 months or as needed

## 2015-11-20 ENCOUNTER — Ambulatory Visit (INDEPENDENT_AMBULATORY_CARE_PROVIDER_SITE_OTHER): Payer: Medicare Other | Admitting: Family Medicine

## 2015-11-20 VITALS — BP 118/76 | HR 76 | Temp 98.1°F | Resp 16 | Ht 61.5 in | Wt 93.8 lb

## 2015-11-20 DIAGNOSIS — J431 Panlobular emphysema: Secondary | ICD-10-CM | POA: Diagnosis not present

## 2015-11-20 DIAGNOSIS — R6889 Other general symptoms and signs: Secondary | ICD-10-CM | POA: Diagnosis not present

## 2015-11-20 DIAGNOSIS — K219 Gastro-esophageal reflux disease without esophagitis: Secondary | ICD-10-CM

## 2015-11-20 DIAGNOSIS — R42 Dizziness and giddiness: Secondary | ICD-10-CM | POA: Diagnosis not present

## 2015-11-20 DIAGNOSIS — E038 Other specified hypothyroidism: Secondary | ICD-10-CM

## 2015-11-20 DIAGNOSIS — E034 Atrophy of thyroid (acquired): Secondary | ICD-10-CM | POA: Diagnosis not present

## 2015-11-20 LAB — T4, FREE: FREE T4: 0.9 ng/dL (ref 0.8–1.8)

## 2015-11-20 LAB — COMPREHENSIVE METABOLIC PANEL
ALBUMIN: 4.1 g/dL (ref 3.6–5.1)
ALK PHOS: 57 U/L (ref 33–130)
ALT: 18 U/L (ref 6–29)
AST: 24 U/L (ref 10–35)
BUN: 23 mg/dL (ref 7–25)
CHLORIDE: 101 mmol/L (ref 98–110)
CO2: 28 mmol/L (ref 20–31)
CREATININE: 0.61 mg/dL (ref 0.60–0.93)
Calcium: 10.1 mg/dL (ref 8.6–10.4)
Glucose, Bld: 90 mg/dL (ref 65–99)
POTASSIUM: 4.5 mmol/L (ref 3.5–5.3)
Sodium: 138 mmol/L (ref 135–146)
TOTAL PROTEIN: 7.1 g/dL (ref 6.1–8.1)
Total Bilirubin: 0.3 mg/dL (ref 0.2–1.2)

## 2015-11-20 LAB — POC MICROSCOPIC URINALYSIS (UMFC): Mucus: ABSENT

## 2015-11-20 LAB — POCT CBC
Granulocyte percent: 57.6 %G (ref 37–80)
HCT, POC: 38 % (ref 37.7–47.9)
HEMOGLOBIN: 13.2 g/dL (ref 12.2–16.2)
LYMPH, POC: 2.3 (ref 0.6–3.4)
MCH, POC: 28 pg (ref 27–31.2)
MCHC: 34.8 g/dL (ref 31.8–35.4)
MCV: 80.3 fL (ref 80–97)
MID (cbc): 0.7 (ref 0–0.9)
MPV: 7.9 fL (ref 0–99.8)
PLATELET COUNT, POC: 270 10*3/uL (ref 142–424)
POC Granulocyte: 4.1 (ref 2–6.9)
POC LYMPH PERCENT: 32.4 %L (ref 10–50)
POC MID %: 10 % (ref 0–12)
RBC: 4.73 M/uL (ref 4.04–5.48)
RDW, POC: 13.8 %
WBC: 7.2 10*3/uL (ref 4.6–10.2)

## 2015-11-20 LAB — POCT URINALYSIS DIP (MANUAL ENTRY)
Bilirubin, UA: NEGATIVE
Glucose, UA: NEGATIVE
Ketones, POC UA: NEGATIVE
Leukocytes, UA: NEGATIVE
NITRITE UA: NEGATIVE
PH UA: 6
Protein Ur, POC: NEGATIVE
Spec Grav, UA: <=1.005
UROBILINOGEN UA: 0.2

## 2015-11-20 LAB — TSH: TSH: 0.57 m[IU]/L

## 2015-11-20 LAB — GLUCOSE, POCT (MANUAL RESULT ENTRY): POC GLUCOSE: 89 mg/dL (ref 70–99)

## 2015-11-20 NOTE — Patient Instructions (Signed)
     IF you received an x-ray today, you will receive an invoice from [facility]. Please contact [facility] at [phone] with questions or concerns regarding your invoice.   IF you received labwork today, you will receive an invoice from [facility]. Please contact [facility] at [phone] with questions or concerns regarding your invoice.   Our billing staff will not be able to assist you with questions regarding bills from these companies.  You will be contacted with the lab results as soon as they are available. The fastest way to get your results is to activate your My Chart account. Instructions are located on the last page of this paperwork. If you have not heard from us regarding the results in 2 weeks, please contact this office.      

## 2015-11-20 NOTE — Progress Notes (Signed)
Subjective:    Patient ID: Shelia Fernandez, female    DOB: 1935-03-28, 81 y.o.   MRN: [id number]  11/20/2015  Dizziness (numbness in both hands since April )   HPI This 81 y.o. female presents with daughter for evaluation of dizziness and numbness in B hands.  Onset one week ago.  Onset with dizziness one week ago.  Overall weakness is biggest issues.  No fever but +chronic chills.  Concerned that thyroid has stopped all together.  Concerned that thyroid medication has stopped working. No headaches.  No blurred vision. No rhinorrhea or nasal congestion.  No coughing; +DOE; s/p evaluation by Dr. Alva Garnet; s/p spirometry; remains on Combivent.  Intolerant to Spiriva in past.  Chronic abdominal pain RUQ to upper R back. +nausea; +vomited bile once this week; intermittent nausea; no appetite; forces self to eat; having bowel movements; some black stools but eats a lot of blueberries.  No dysuria; drinking more water; increasing urination.  Tingling in fingers; numbness in fingertips; hands are cold; feels pale. Not exercising lately; was walking a lot [place] to this week.     Review of Systems  Constitutional: Positive for diaphoresis and fatigue. Negative for chills and fever.  Eyes: Negative for visual disturbance.  Respiratory: Negative for cough and shortness of breath.   Cardiovascular: Negative for chest pain, palpitations and leg swelling.  Gastrointestinal: Negative for abdominal pain, constipation, diarrhea, nausea and vomiting.  Endocrine: Positive for cold intolerance. Negative for heat intolerance, polydipsia, polyphagia and polyuria.  Neurological: Positive for dizziness, light-headedness and numbness. Negative for tremors, seizures, syncope, facial asymmetry, speech difficulty, weakness and headaches.    Past Medical History:  Diagnosis Date  . Benign positional vertigo    s/p ENT consultation with Eply maneuvers 2014.  [facility].  . Cataract   . COPD (chronic obstructive pulmonary  disease) ([place])   . Diarrhea   . GERD (gastroesophageal reflux disease)   . Myocardial infarction ([place])   . Osteoporosis    R hip fracture; L hip fracture.  Intolerance to bisphosphonates; GI upset.  Took bisphosphonates x 10 years; last bisphosphonates 2000.  s/p L wrist fracture 2001.  Marland Kitchen Pancreas anomaly, congenital   . Thyroid disease    Hypothyroidism   Past Surgical History:  Procedure Laterality Date  . APPENDECTOMY    . COLONOSCOPY     x 2; failure due to spasms.  [facility]. [facility].  Marland Kitchen ENT consult     +BPV; Eply maneuvers wtih improvement.  [facility].  Marland Kitchen FRACTURE SURGERY     R hip fracture s/p ORIF; L hip fracture s/p ORIF.  Marland Kitchen TUBAL LIGATION     Allergies  Allergen Reactions  . Biaxin [Clarithromycin]     Had a strong a reaction to it (can't remember specifically)  . Gluten Meal     Stomach upset  . Lactose Intolerance (Gi) Nausea And Vomiting  . Sudafed [Pseudoephedrine Hcl]     Can't recall reaction  . Penicillins Hives and Rash  . Sulfa Antibiotics Hives and Rash   Current Outpatient Prescriptions  Medication Sig Dispense Refill  . albuterol (PROVENTIL HFA;VENTOLIN HFA) 108 (90 Base) MCG/ACT inhaler Inhale 2 puffs into the lungs every 6 (six) hours as needed for wheezing or shortness of breath. 1 Inhaler 2  . Calcium Citrate-Vitamin D (CALCIUM CITRATE + D PO) Take 1 tablet by mouth daily.    . Ipratropium-Albuterol (COMBIVENT) 20-100 MCG/ACT AERS respimat Inhale 1 puff into the lungs every 6 (six) hours. 4 g  11  . Multiple Vitamin (MULTI-VITAMINS) TABS Take 1 tablet by mouth.     . thyroid (ARMOUR THYROID) 30 MG tablet Take 1 tablet (30 mg total) by mouth daily before breakfast. 90 tablet 3   No current facility-administered medications for this visit.    Social History   Social History  . Marital status: Married    Spouse name: N/A  . Number of children: 1  . Years of education: N/A   Occupational History  . retired    Social History Main Topics  .  Smoking status: Passive Smoke Exposure - Never Smoker  . Smokeless tobacco: Never Used     Comment: pt's father and husband both smoked in house.   Marland Kitchen Alcohol use No  . Drug use: No  . Sexual activity: Not Currently   Other Topics Concern  . Not on file   Social History Narrative   Marital status: widowed x 1997 after 30+ years; not dating; not interested.       Children:  1 daughter; 2 granddaughters; no gg.      Lives:  With daughter [name]), son-in-law      Tobacco: never; husband smoked      Alcohol: none      Exercise:  Walking three to four days per week at [facility] for 30 minutes.      ADLs:  No assistant devices; has a cane for PRN use.  Drives; goes to grocery store; no cleaning; no bill paying; daughter pays bills.        Advanced Directives:  +LIVING WILL; +FULL CODE.  HCPOA: daughter(Shelley).       Volunteers at [facility] once per week; visits sister in [place] once weekly; plays bridge two days per week at neighbors or home; has small church group on Wednesdays; goes to church on Sundays.    Family History  Problem Relation Age of Onset  . Cancer Sister     Terminal cancer in 2017; Breast cancer; endometrial cancer; vaginal cancer  . COPD Sister   . Cancer Sister     Breast cancer  . Cancer Mother 21    cervical cancer  . Asthma Mother   . COPD Father   . Cancer Sister     Melanoma       Objective:    BP 118/76 (BP Location: Left Arm, Patient Position: Sitting, Cuff Size: Small)   Pulse 76   Temp 98.1 F (36.7 C) (Oral)   Resp 16   Ht 5' 1.5" (1.562 m)   Wt 93 lb 12.8 oz (42.5 kg)   SpO2 98%   BMI 17.44 kg/m  Physical Exam  Constitutional: She is oriented to person, place, and time. She appears well-developed and well-nourished. No distress.  HENT:  Head: Normocephalic and atraumatic.  Right Ear: External ear normal.  Left Ear: External ear normal.  Nose: Nose normal.  Mouth/Throat: Oropharynx is clear and moist.  Eyes: Conjunctivae and EOM  are normal. Pupils are equal, round, and reactive to light.  Neck: Normal range of motion. Neck supple. Carotid bruit is not present. No thyromegaly present.  Cardiovascular: Normal rate, regular rhythm, normal heart sounds and intact distal pulses.  Exam reveals no gallop and no friction rub.   No murmur heard. Pulmonary/Chest: Effort normal and breath sounds normal. She has no wheezes. She has no rales.  Abdominal: Soft. Bowel sounds are normal. She exhibits no distension and no mass. There is no tenderness. There is no rebound and  no guarding.  Lymphadenopathy:    She has no cervical adenopathy.  Neurological: She is alert and oriented to person, place, and time. No cranial nerve deficit. She exhibits normal muscle tone. Coordination normal.  Skin: Skin is warm and dry. No rash noted. She is not diaphoretic. No erythema. No pallor.  Psychiatric: She has a normal mood and affect. Her behavior is normal. Judgment and thought content normal.   Results for orders placed or performed in visit on 11/20/15  Comprehensive metabolic panel  Result Value Ref Range   Sodium 138 135 - 146 mmol/L   Potassium 4.5 3.5 - 5.3 mmol/L   Chloride 101 98 - 110 mmol/L   CO2 28 20 - 31 mmol/L   Glucose, Bld 90 65 - 99 mg/dL   BUN 23 7 - 25 mg/dL   Creat 0.61 0.60 - 0.93 mg/dL   Total Bilirubin 0.3 0.2 - 1.2 mg/dL   Alkaline Phosphatase 57 33 - 130 U/L   AST 24 10 - 35 U/L   ALT 18 6 - 29 U/L   Total Protein 7.1 6.1 - 8.1 g/dL   Albumin 4.1 3.6 - 5.1 g/dL   Calcium 10.1 8.6 - 10.4 mg/dL  TSH  Result Value Ref Range   TSH 0.57 mIU/L  T4, free  Result Value Ref Range   Free T4 0.9 0.8 - 1.8 ng/dL  POCT CBC  Result Value Ref Range   WBC 7.2 4.6 - 10.2 K/uL   Lymph, poc 2.3 0.6 - 3.4   POC LYMPH PERCENT 32.4 10 - 50 %L   MID (cbc) 0.7 0 - 0.9   POC MID % 10.0 0 - 12 %M   POC Granulocyte 4.1 2 - 6.9   Granulocyte percent 57.6 37 - 80 %G   RBC 4.73 4.04 - 5.48 M/uL   Hemoglobin 13.2 12.2 - 16.2 g/dL    HCT, POC 38.0 37.7 - 47.9 %   MCV 80.3 80 - 97 fL   MCH, POC 28.0 27 - 31.2 pg   MCHC 34.8 31.8 - 35.4 g/dL   RDW, POC 13.8 %   Platelet Count, POC 270 142 - 424 K/uL   MPV 7.9 0 - 99.8 fL  POCT glucose (manual entry)  Result Value Ref Range   POC Glucose 89 70 - 99 mg/dl  POCT urinalysis dipstick  Result Value Ref Range   Color, UA yellow yellow   Clarity, UA clear clear   Glucose, UA negative negative   Bilirubin, UA negative negative   Ketones, POC UA negative negative   Spec Grav, UA <=1.005    Blood, UA trace-lysed (A) negative   pH, UA 6.0    Protein Ur, POC negative negative   Urobilinogen, UA 0.2    Nitrite, UA Negative Negative   Leukocytes, UA Negative Negative  POCT Microscopic Urinalysis ([facility])  Result Value Ref Range   WBC,UR,HPF,POC None None WBC/hpf   RBC,UR,HPF,POC None None RBC/hpf   Bacteria None None, Too numerous to count   Mucus Absent Absent   Epithelial Cells, UR Per Microscopy None None, Too numerous to count cells/hpf       Assessment & Plan:   1. Dizziness   2. Hypothyroidism due to acquired atrophy of thyroid   3. Cold intolerance   4. Panlobular emphysema ([place])   5. Gastroesophageal reflux disease without esophagitis    -new. -obtain labs. -supportive care with rest, fluids. -RTC for acute worsening.   Orders Placed This Encounter  Procedures  .  Comprehensive metabolic panel  . TSH  . T4, free  . POCT CBC  . POCT glucose (manual entry)  . POCT urinalysis dipstick  . POCT Microscopic Urinalysis ([facility])  . EKG 12-Lead   No orders of the defined types were placed in this encounter.   No Follow-up on file.   Sheyanne Munley Elayne Guerin, M.D. [facility] [address] [place], [place]  [address] [phone] phone [phone] fax

## 2015-11-28 ENCOUNTER — Other Ambulatory Visit: Payer: Self-pay | Admitting: Family Medicine

## 2015-11-28 DIAGNOSIS — J431 Panlobular emphysema: Secondary | ICD-10-CM

## 2016-01-08 ENCOUNTER — Ambulatory Visit (INDEPENDENT_AMBULATORY_CARE_PROVIDER_SITE_OTHER): Payer: Medicare Other | Admitting: Family Medicine

## 2016-01-08 ENCOUNTER — Ambulatory Visit: Payer: Medicare Other

## 2016-01-08 VITALS — BP 110/66 | HR 95 | Temp 98.1°F | Resp 18 | Ht 61.5 in | Wt 95.4 lb

## 2016-01-08 DIAGNOSIS — E034 Atrophy of thyroid (acquired): Secondary | ICD-10-CM

## 2016-01-08 DIAGNOSIS — K219 Gastro-esophageal reflux disease without esophagitis: Secondary | ICD-10-CM | POA: Diagnosis not present

## 2016-01-08 DIAGNOSIS — R3121 Asymptomatic microscopic hematuria: Secondary | ICD-10-CM | POA: Diagnosis not present

## 2016-01-08 DIAGNOSIS — R1011 Right upper quadrant pain: Secondary | ICD-10-CM

## 2016-01-08 LAB — POC MICROSCOPIC URINALYSIS (UMFC): Mucus: ABSENT

## 2016-01-08 LAB — POCT URINALYSIS DIP (MANUAL ENTRY)
BILIRUBIN UA: NEGATIVE
Bilirubin, UA: NEGATIVE
Glucose, UA: NEGATIVE
Nitrite, UA: NEGATIVE
PH UA: 6.5
PROTEIN UA: NEGATIVE
SPEC GRAV UA: 1.015
Urobilinogen, UA: 0.2

## 2016-01-08 MED ORDER — THYROID 48.75 MG PO TABS: 48.7500 mg | ORAL_TABLET | Freq: Every day | ORAL | 5 refills | Status: DC

## 2016-01-08 NOTE — Progress Notes (Signed)
Subjective:    Patient ID: Shelia Fernandez, female    DOB: 02-Aug-1934, 81 y.o.   MRN: [id number]  01/08/2016  Abdominal Pain (UPPER BREAST AREA AND BACK PAIN)   HPI This 81 y.o. female presents for evaluation of worsening RUQ abdominal pain.  Acute worsening RUQ pain four weeks ago.  Bloating is getting worse.  Gets SOB postprandial region.  No fever/chills/sweats.  No nausea; no vomiting.  Mild heartburn and indigestion; no GERD.  Having a daily bowel movement; previously had 1-3 stools per day; now goes a little bit every morning but not normal; small caliber and small amount.  Decrease in intake due to bloating.  No bloody stools or melena; light colored stools.  Felt like passed a gallstone over the weekend.  Bloats horribly after eating; then cannot breathe.  If eats lean meat, less bloating.  [place] deli meat on potato bread; cannot tolerate wheat bread. No urinary symptoms; +frequency yet has added a lot of water.  No dysuria; no hematuria or nocturia.  Intermittent burning pain; taking Ranitidine 150mg  PRN; taking it yesterday which was the first she had taken in a week.  Ate two boiled eggs for breakfast without pain.  Ate sandwich and had some discomfort; vegetables make it worse.  No recent Prilosec or Nexium.  Abdominal pain radiates to upper back on R.  Hemosure negative at GI office last week; results reviewed in [place].  Saw PA at Dr. Percell [place] office; s/p abdominal xray; wants to perform colonoscopy.  Felt that patient passing kidney stone.  Felt like saw on xray.  Requesting thyroid labs from last visit; concerned that thyroid still underactive.  Wt Readings from Last 3 Encounters:  01/08/16 95 lb 6.4 oz (43.3 kg)  11/20/15 93 lb 12.8 oz (42.5 kg)  11/01/15 94 lb (42.6 kg)    Review of Systems  Constitutional: Negative for chills, diaphoresis, fatigue and fever.  Eyes: Negative for visual disturbance.  Respiratory: Negative for cough and shortness of breath.     Cardiovascular: Negative for chest pain, palpitations and leg swelling.  Gastrointestinal: Positive for abdominal distention and abdominal pain. Negative for anal bleeding, blood in stool, constipation, diarrhea, nausea, rectal pain and vomiting.  Endocrine: Negative for cold intolerance, heat intolerance, polydipsia, polyphagia and polyuria.  Genitourinary: Negative for dysuria, flank pain, frequency, hematuria and urgency.  Neurological: Negative for dizziness, tremors, seizures, syncope, facial asymmetry, speech difficulty, weakness, light-headedness, numbness and headaches.    Past Medical History:  Diagnosis Date  . Benign positional vertigo    s/p ENT consultation with Eply maneuvers 2014.  [facility].  . Cataract   . COPD (chronic obstructive pulmonary disease) ([place])   . Diarrhea   . GERD (gastroesophageal reflux disease)   . Myocardial infarction   . Osteoporosis    R hip fracture; L hip fracture.  Intolerance to bisphosphonates; GI upset.  Took bisphosphonates x 10 years; last bisphosphonates 2000.  s/p L wrist fracture 2001.  Marland Kitchen Pancreas anomaly, congenital   . Thyroid disease    Hypothyroidism   Past Surgical History:  Procedure Laterality Date  . APPENDECTOMY    . COLONOSCOPY     x 2; failure due to spasms.  [facility]. [facility].  Marland Kitchen ENT consult     +BPV; Eply maneuvers wtih improvement.  [facility]/ENT.  Marland Kitchen FRACTURE SURGERY     R hip fracture s/p ORIF; L hip fracture s/p ORIF.  Marland Kitchen TUBAL LIGATION     Allergies  Allergen Reactions  . Biaxin [  Clarithromycin]     Had a strong a reaction to it (can't remember specifically)  . Gluten Meal     Stomach upset  . Lactose Intolerance (Gi) Nausea And Vomiting  . Sudafed [Pseudoephedrine Hcl]     Can't recall reaction  . Penicillins Hives and Rash  . Sulfa Antibiotics Hives and Rash   Current Outpatient Prescriptions  Medication Sig Dispense Refill  . albuterol (PROVENTIL HFA;VENTOLIN HFA) 108 (90 Base) MCG/ACT inhaler Inhale 2  puffs into the lungs every 6 (six) hours as needed for wheezing or shortness of breath. 1 Inhaler 2  . Calcium Citrate-Vitamin D (CALCIUM CITRATE + D PO) Take 1 tablet by mouth daily.    . Ipratropium-Albuterol (COMBIVENT) 20-100 MCG/ACT AERS respimat Inhale 1 puff into the lungs every 6 (six) hours. 4 g 11  . Multiple Vitamin (MULTI-VITAMINS) TABS Take 1 tablet by mouth.     . Thyroid 48.75 MG TABS Take 48.75 mg by mouth daily before breakfast. 30 tablet 5   No current facility-administered medications for this visit.    Social History   Social History  . Marital status: Married    Spouse name: N/A  . Number of children: 1  . Years of education: N/A   Occupational History  . retired    Social History Main Topics  . Smoking status: Passive Smoke Exposure - Never Smoker  . Smokeless tobacco: Never Used     Comment: pt's father and husband both smoked in house.   Marland Kitchen Alcohol use No  . Drug use: No  . Sexual activity: Not Currently   Other Topics Concern  . Not on file   Social History Narrative   Marital status: widowed x 1997 after 30+ years; not dating; not interested.       Children:  1 daughter; 2 granddaughters; no gg.      Lives:  With daughter [name]), son-in-law      Tobacco: never; husband smoked      Alcohol: none      Exercise:  Walking three to four days per week at [facility] for 30 minutes.      ADLs:  No assistant devices; has a cane for PRN use.  Drives; goes to grocery store; no cleaning; no bill paying; daughter pays bills.        Advanced Directives:  +LIVING WILL; +FULL CODE.  HCPOA: daughter(Shelley).       Volunteers at [facility] [employer] once per week; visits sister in [place] once weekly; plays bridge two days per week at neighbors or home; has small church group on Wednesdays; goes to church on Sundays.    Family History  Problem Relation Age of Onset  . Cancer Sister     Terminal cancer in 2017; Breast cancer; endometrial cancer; vaginal cancer  . COPD  Sister   . Cancer Sister     Breast cancer  . Cancer Mother 32    cervical cancer  . Asthma Mother   . COPD Father   . Cancer Sister     Melanoma       Objective:    BP 110/66 (BP Location: Right Arm, Patient Position: Sitting, Cuff Size: Small)   Pulse 95   Temp 98.1 F (36.7 C) (Oral)   Resp 18   Ht 5' 1.5" (1.562 m)   Wt 95 lb 6.4 oz (43.3 kg)   SpO2 96%   BMI 17.73 kg/m  Physical Exam  Constitutional: She is oriented to person, place, and time.  She appears well-developed and well-nourished. No distress.  HENT:  Head: Normocephalic and atraumatic.  Right Ear: External ear normal.  Left Ear: External ear normal.  Nose: Nose normal.  Mouth/Throat: Oropharynx is clear and moist.  Eyes: Conjunctivae and EOM are normal. Pupils are equal, round, and reactive to light.  Neck: Normal range of motion. Neck supple. Carotid bruit is not present. No thyromegaly present.  Cardiovascular: Normal rate, regular rhythm, normal heart sounds and intact distal pulses.  Exam reveals no gallop and no friction rub.   No murmur heard. Pulmonary/Chest: Effort normal and breath sounds normal. She has no wheezes. She has no rales.  Abdominal: Soft. Bowel sounds are normal. She exhibits no distension and no mass. There is tenderness in the right upper quadrant and epigastric area. There is no rebound, no guarding and no CVA tenderness. No hernia.  Lymphadenopathy:    She has no cervical adenopathy.  Neurological: She is alert and oriented to person, place, and time. No cranial nerve deficit.  Skin: Skin is warm and dry. No rash noted. She is not diaphoretic. No erythema. No pallor.  Psychiatric: She has a normal mood and affect. Her behavior is normal.   Results for orders placed or performed in visit on 01/08/16  POCT urinalysis dipstick  Result Value Ref Range   Color, UA yellow yellow   Clarity, UA clear clear   Glucose, UA negative negative   Bilirubin, UA negative negative   Ketones,  POC UA negative negative   Spec Grav, UA 1.015    Blood, UA small (A) negative   pH, UA 6.5    Protein Ur, POC negative negative   Urobilinogen, UA 0.2    Nitrite, UA Negative Negative   Leukocytes, UA Trace (A) Negative       Assessment & Plan:   1. Abdominal pain, RUQ   2. Hypothyroidism due to acquired atrophy of thyroid   3. Gastroesophageal reflux disease without esophagitis   4. Asymptomatic microscopic hematuria    -new/worsening. -obtain abdominal US. -obtain labs. -add Ranitidine 150mg  bid to treat GERD. -BRAT diet, hydration.   Orders Placed This Encounter  Procedures  . Urine culture  . US Abdomen Complete    Standing Status:   Future    Standing Expiration Date:   03/09/2017    Order Specific Question:   Reason for Exam (SYMPTOM  OR DIAGNOSIS REQUIRED)    Answer:   RUQ pain; postprandial bloating    Order Specific Question:   Preferred imaging location?    Answer:    [facility]  . CBC with Differential/Platelet  . Comprehensive metabolic panel  . H. pylori breath test  . Amylase  . Lipase  . Celiac panel 10  . POCT urinalysis dipstick  . POCT Microscopic Urinalysis ([facility])   Meds ordered this encounter  Medications  . Thyroid 48.75 MG TABS    Sig: Take 48.75 mg by mouth daily before breakfast.    Dispense:  30 tablet    Refill:  5    No Follow-up on file.   Lawana Hartzell Elayne Guerin, M.D. [facility] [address] [place], [place]  [address] [phone] phone [phone] fax

## 2016-01-08 NOTE — Patient Instructions (Addendum)
1.  Increase Ranitidine 150mg  to one tablet twice daily before a meal. Continue this medication for the next month.    IF you received an x-ray today, you will receive an invoice from [facility]. Please contact [facility] at [phone] with questions or concerns regarding your invoice.   IF you received labwork today, you will receive an invoice from [employer]. Please contact [facility] at [phone] with questions or concerns regarding your invoice.   Our billing staff will not be able to assist you with questions regarding bills from these companies.  You will be contacted with the lab results as soon as they are available. The fastest way to get your results is to activate your My Chart account. Instructions are located on the last page of this paperwork. If you have not heard from [place] regarding the results in 2 weeks, please contact this office.     Abdominal Pain, Adult Many things can cause abdominal pain. Usually, abdominal pain is not caused by a disease and will improve without treatment. It can often be observed and treated at home. Your health care provider will do a physical exam and possibly order blood tests and X-rays to help determine the seriousness of your pain. However, in many cases, more time must pass before a clear cause of the pain can be found. Before that point, your health care provider may not know if you need more testing or further treatment. HOME CARE INSTRUCTIONS Monitor your abdominal pain for any changes. The following actions may help to alleviate any discomfort you are experiencing:  Only take over-the-counter or prescription medicines as directed by your health care provider.  Do not take laxatives unless directed to do so by your health care provider.  Try a clear liquid diet (broth, tea, or water) as directed by your health care provider. Slowly move to a bland diet as tolerated. SEEK MEDICAL CARE  IF:  You have unexplained abdominal pain.  You have abdominal pain associated with nausea or diarrhea.  You have pain when you urinate or have a bowel movement.  You experience abdominal pain that wakes you in the night.  You have abdominal pain that is worsened or improved by eating food.  You have abdominal pain that is worsened with eating fatty foods.  You have a fever. SEEK IMMEDIATE MEDICAL CARE IF:  Your pain does not go away within 2 hours.  You keep throwing up (vomiting).  Your pain is felt only in portions of the abdomen, such as the right side or the left lower portion of the abdomen.  You pass bloody or black tarry stools. MAKE SURE YOU:  Understand these instructions.  Will watch your condition.  Will get help right away if you are not doing well or get worse.   This information is not intended to replace advice given to you by your health care provider. Make sure you discuss any questions you have with your health care provider.   Document Released: 12/31/2004 Document Revised: 12/12/2014 Document Reviewed: 11/30/2012 Elsevier Interactive Patient Education [employer].

## 2016-01-09 LAB — COMPREHENSIVE METABOLIC PANEL
ALBUMIN: 4.1 g/dL (ref 3.6–5.1)
ALT: 16 U/L (ref 6–29)
AST: 23 U/L (ref 10–35)
Alkaline Phosphatase: 55 U/L (ref 33–130)
BILIRUBIN TOTAL: 0.4 mg/dL (ref 0.2–1.2)
BUN: 21 mg/dL (ref 7–25)
CO2: 27 mmol/L (ref 20–31)
CREATININE: 0.66 mg/dL (ref 0.60–0.93)
Calcium: 9.9 mg/dL (ref 8.6–10.4)
Chloride: 102 mmol/L (ref 98–110)
Glucose, Bld: 94 mg/dL (ref 65–99)
Potassium: 4 mmol/L (ref 3.5–5.3)
SODIUM: 139 mmol/L (ref 135–146)
TOTAL PROTEIN: 7.2 g/dL (ref 6.1–8.1)

## 2016-01-09 LAB — CBC WITH DIFFERENTIAL/PLATELET
Basophils Absolute: 64 cells/uL (ref 0–200)
Basophils Relative: 1 %
EOS PCT: 1 %
Eosinophils Absolute: 64 cells/uL (ref 15–500)
HCT: 38.6 % (ref 35.0–45.0)
HEMOGLOBIN: 12.6 g/dL (ref 11.7–15.5)
LYMPHS ABS: 1792 {cells}/uL (ref 850–3900)
Lymphocytes Relative: 28 %
MCH: 26.7 pg — ABNORMAL LOW (ref 27.0–33.0)
MCHC: 32.6 g/dL (ref 32.0–36.0)
MCV: 81.8 fL (ref 80.0–100.0)
MPV: 10.8 fL (ref 7.5–12.5)
Monocytes Absolute: 512 cells/uL (ref 200–950)
Monocytes Relative: 8 %
NEUTROS ABS: 3968 {cells}/uL (ref 1500–7800)
Neutrophils Relative %: 62 %
Platelets: 308 10*3/uL (ref 140–400)
RBC: 4.72 MIL/uL (ref 3.80–5.10)
RDW: 13.6 % (ref 11.0–15.0)
WBC: 6.4 10*3/uL (ref 3.8–10.8)

## 2016-01-09 LAB — H. PYLORI BREATH TEST: H. pylori Breath Test: NOT DETECTED

## 2016-01-09 LAB — AMYLASE: AMYLASE: 71 U/L (ref 0–105)

## 2016-01-09 LAB — LIPASE: Lipase: 52 U/L (ref 7–60)

## 2016-01-09 LAB — URINE CULTURE

## 2016-01-13 ENCOUNTER — Ambulatory Visit
Admission: RE | Admit: 2016-01-13 | Discharge: 2016-01-13 | Disposition: A | Discharge status: Home / Self Care | Payer: Medicare Other | Source: Ambulatory Visit | Attending: Family Medicine | Admitting: Family Medicine

## 2016-01-13 DIAGNOSIS — R1011 Right upper quadrant pain: Secondary | ICD-10-CM | POA: Insufficient documentation

## 2016-01-13 DIAGNOSIS — K7689 Other specified diseases of liver: Secondary | ICD-10-CM | POA: Insufficient documentation

## 2016-01-13 LAB — ENDOMYSIAL AB IGA RFLX TITER: Endomysial Screen: NEGATIVE

## 2016-01-14 LAB — GLIADIN ANTIBODIES, SERUM
GLIADIN IGA: 13 U (ref <20)
GLIADIN IGG: 8 U (ref <20)

## 2016-01-14 LAB — CELIAC DISEASE COMPREHENSIVE PANEL WITH REFLEXES
IgA: 348 mg/dL (ref 81–463)
Tissue Transglutaminase Ab, IgA: 1 U/mL (ref <4)

## 2016-01-14 LAB — TISSUE TRANSGLUTAMINASE, IGG: Tissue Transglut Ab: 4 U/mL (ref <6)

## 2016-01-17 ENCOUNTER — Ambulatory Visit (INDEPENDENT_AMBULATORY_CARE_PROVIDER_SITE_OTHER): Payer: Medicare Other | Admitting: Family Medicine

## 2016-01-17 ENCOUNTER — Ambulatory Visit (INDEPENDENT_AMBULATORY_CARE_PROVIDER_SITE_OTHER): Payer: Medicare Other

## 2016-01-17 VITALS — BP 94/50 | HR 80 | Temp 98.0°F | Resp 17 | Ht 61.5 in | Wt 95.0 lb

## 2016-01-17 DIAGNOSIS — K582 Mixed irritable bowel syndrome: Secondary | ICD-10-CM | POA: Diagnosis not present

## 2016-01-17 DIAGNOSIS — R140 Abdominal distension (gaseous): Secondary | ICD-10-CM

## 2016-01-17 DIAGNOSIS — K5901 Slow transit constipation: Secondary | ICD-10-CM

## 2016-01-17 NOTE — Progress Notes (Signed)
Subjective:    Patient ID: Shelia Fernandez, female    DOB: [birth year], 81 y.o.   MRN: [id number]  01/17/2016  Follow-up (stomach bloated/ no bowel movement)   HPI This 81 y.o. female presents for ten day follow-up for abdominal pain.  Having two small stools per day the size of fingers.  Also not eating. No appetite; bloated.  Mild nausea first time.  No vomiting.  Eating makes worse.  Lots of gas; not expelling gas a lot.  No belching; no reflux.  Taking Ranitidine 143m bid without improvement.  Has a hard time with red dyes; does not see on bottle.  Drinking lactose milk and fat free milk; stopped milk this week because of worsening symptoms.  Worse than was.  Bloating is the most problematic issue.   Previously had three stools three times daily; now nothing.  Has progressively worsening.  Not exercising in several months; previously walked daily; stopped walking daily four months.  Two bottles of water per day.  Was drinking lactose free milk but stopped it.  Drinks coffee one cup after each meal decaf.  Took Phillips MOM capsules two   Wt Readings from Last 3 Encounters:  01/17/16 95 lb (43.1 kg)  01/08/16 95 lb 6.4 oz (43.3 kg)  11/20/15 93 lb 12.8 oz (42.5 kg)    Still unable to locate the higher dose of armourthyroid.  Review of Systems  Constitutional: Negative for chills, diaphoresis, fatigue and fever.  Eyes: Negative for visual disturbance.  Respiratory: Negative for cough and shortness of breath.   Cardiovascular: Negative for chest pain, palpitations and leg swelling.  Gastrointestinal: Positive for abdominal distention and constipation. Negative for abdominal pain, anal bleeding, blood in stool, diarrhea, nausea, rectal pain and vomiting.  Endocrine: Negative for cold intolerance, heat intolerance, polydipsia, polyphagia and polyuria.  Genitourinary: Negative for dysuria.  Neurological: Negative for dizziness, tremors, seizures, syncope, facial asymmetry, speech  difficulty, weakness, light-headedness, numbness and headaches.    Past Medical History:  Diagnosis Date  . Benign positional vertigo    s/p ENT consultation with Eply maneuvers 2014.  [facility].  . Cataract   . COPD (ch[place] obstructive pulmonary disease) [place]   . Diarrhea   . GERD (gastroesophageal reflux disease)   . Myocardial infarction   . Osteoporosis    R hip fracture; L hip fracture.  Intolerance to bisphosphonates; GI upset.  Took bisphosphonates x 10 years; last bisphosphonates 2000.  s/p L wrist fracture 2001.  .Marland KitchenPancreas anomaly, congenital   . Thyroid disease    Hypothyroidism   Past Surgical History:  Procedure Laterality Date  . APPENDECTOMY    . COLONOSCOPY     x 2; failure due to spasms.  [facility]. [facility].  .Marland KitchenENT consult     +BPV; Eply maneuvers wtih improvement.  [facility].  .Marland KitchenFRACTURE SURGERY     R hip fracture s/p ORIF; L hip fracture s/p ORIF.  .Marland KitchenTUBAL LIGATION     Allergies  Allergen Reactions  . Biaxin [Clarithromycin]     Had a strong a reaction to it (can't remember specifically)  . Gluten Meal     Stomach upset  . Lactose Intolerance (Gi) Nausea And Vomiting  . Sudafed [Pseudoephedrine Hcl]     Can't recall reaction  . Penicillins Hives and Rash  . Sulfa Antibiotics Hives and Rash   Current Outpatient Prescriptions  Medication Sig Dispense Refill  . albuterol (PROVENTIL HFA;VENTOLIN HFA) 108 (90 Base) MCG/ACT inhaler Inhale 2 puffs into  the lungs every 6 (six) hours as needed for wheezing or shortness of breath. 1 Inhaler 2  . Calcium Citrate-Vitamin D (CALCIUM CITRATE + D PO) Take 1 tablet by mouth daily.    . Ipratropium-Albuterol (COMBIVENT) 20-100 MCG/ACT AERS respimat Inhale 1 puff into the lungs every 6 (six) hours. 4 g 11  . Multiple Vitamin (MULTI-VITAMINS) TABS Take 1 tablet by mouth.     . Thyroid 48.75 MG TABS Take 48.75 mg by mouth daily before breakfast. (Patient not taking: Reported on 01/17/2016) 30 tablet 5   No current  facility-administered medications for this visit.    Social History   Social History  . Marital status: Widowed    Spouse name: N/A  . Number of children: 1  . Years of education: N/A   Occupational History  . retired    Social History Main Topics  . Smoking status: Passive Smoke Exposure - Never Smoker  . Smokeless tobacco: Never Used     Comment: pt's father and husband both smoked in house.   Marland Kitchen Alcohol use No  . Drug use: No  . Sexual activity: Not Currently   Other Topics Concern  . Not on file   Social History Narrative   Marital status: widowed x 1997 after 30+ years; not dating; not interested.       Children:  1 daughter; 2 granddaughters; no gg.      Lives:  With daughter [name]), son-in-law      Tobacco: never; husband smoked      Alcohol: none      Exercise:  Walking three to four days per week at [facility] for 30 minutes.      ADLs:  No assistant devices; has a cane for PRN use.  Drives; goes to grocery store; no cleaning; no bill paying; daughter pays bills.        Advanced Directives:  +LIVING WILL; +FULL CODE.  HCPOA: daughter(Shelley).       Volunteers at [facility] once per week; visits sister in [place] once weekly; plays bridge two days per week at neighbors or home; has small church group on Wednesdays; goes to church on Sundays.    Family History  Problem Relation Age of Onset  . Cancer Sister     Terminal cancer in 2017; Breast cancer; endometrial cancer; vaginal cancer  . COPD Sister   . Cancer Sister     Breast cancer  . Cancer Mother 36    cervical cancer  . Asthma Mother   . COPD Father   . Cancer Sister     Melanoma       Objective:    BP (!) 94/50 (BP Location: Right Arm, Patient Position: Sitting, Cuff Size: Normal)   Pulse 80   Temp 98 F (36.7 C) (Oral)   Resp 17   Ht 5' 1.5" (1.562 m)   Wt 95 lb (43.1 kg)   SpO2 97%   BMI 17.66 kg/m  Physical Exam  Constitutional: She is oriented to person, place, and time. She  appears well-developed and well-nourished. No distress.  HENT:  Head: Normocephalic and atraumatic.  Right Ear: External ear normal.  Left Ear: External ear normal.  Nose: Nose normal.  Mouth/Throat: Oropharynx is clear and moist.  Eyes: Conjunctivae and EOM are normal. Pupils are equal, round, and reactive to light.  Neck: Normal range of motion. Neck supple. Carotid bruit is not present. No thyromegaly present.  Cardiovascular: Normal rate, regular rhythm, normal heart sounds and  intact distal pulses.  Exam reveals no gallop and no friction rub.   No murmur heard. Pulmonary/Chest: Effort normal and breath sounds normal. She has no wheezes. She has no rales.  Abdominal: Soft. Bowel sounds are normal. She exhibits no distension and no mass. There is tenderness in the left lower quadrant. There is no rebound, no guarding and no CVA tenderness.  Lymphadenopathy:    She has no cervical adenopathy.  Neurological: She is alert and oriented to person, place, and time. No cranial nerve deficit.  Skin: Skin is warm and dry. No rash noted. She is not diaphoretic. No erythema. No pallor.  Psychiatric: She has a normal mood and affect. Her behavior is normal.   Results for orders placed or performed in visit on 01/08/16  Urine culture  Result Value Ref Range   Organism ID, Bacteria      Multiple organisms present,each less than 10,000 CFU/mL. These organisms,commonly found on external and internal genitalia,are considered colonizers. No further testing performed.   CBC with Differential/Platelet  Result Value Ref Range   WBC 6.4 3.8 - 10.8 K/uL   RBC 4.72 3.80 - 5.10 MIL/uL   Hemoglobin 12.6 11.7 - 15.5 g/dL   HCT 38.6 35.0 - 45.0 %   MCV 81.8 80.0 - 100.0 fL   MCH 26.7 (L) 27.0 - 33.0 pg   MCHC 32.6 32.0 - 36.0 g/dL   RDW 13.6 11.0 - 15.0 %   Platelets 308 140 - 400 K/uL   MPV 10.8 7.5 - 12.5 fL   Neutro Abs 3,968 1,500 - 7,800 cells/uL   Lymphs Abs 1,792 850 - 3,900 cells/uL    Monocytes Absolute 512 200 - 950 cells/uL   Eosinophils Absolute 64 15 - 500 cells/uL   Basophils Absolute 64 0 - 200 cells/uL   Neutrophils Relative % 62 %   Lymphocytes Relative 28 %   Monocytes Relative 8 %   Eosinophils Relative 1 %   Basophils Relative 1 %   Smear Review Criteria for review not met   Comprehensive metabolic panel  Result Value Ref Range   Sodium 139 135 - 146 mmol/L   Potassium 4.0 3.5 - 5.3 mmol/L   Chloride 102 98 - 110 mmol/L   CO2 27 20 - 31 mmol/L   Glucose, Bld 94 65 - 99 mg/dL   BUN 21 7 - 25 mg/dL   Creat 0.66 0.60 - 0.93 mg/dL   Total Bilirubin 0.4 0.2 - 1.2 mg/dL   Alkaline Phosphatase 55 33 - 130 U/L   AST 23 10 - 35 U/L   ALT 16 6 - 29 U/L   Total Protein 7.2 6.1 - 8.1 g/dL   Albumin 4.1 3.6 - 5.1 g/dL   Calcium 9.9 8.6 - 10.4 mg/dL  H. pylori breath test  Result Value Ref Range   H. pylori Breath Test NOT DETECTED Not Detected  Amylase  Result Value Ref Range   Amylase 71 0 - 105 U/L  Lipase  Result Value Ref Range   Lipase 52 7 - 60 U/L  Celiac Disease Comprehensive Panel with Reflexes  Result Value Ref Range   IgA 348 81 - 463 mg/dL   Tissue Transglutaminase Ab, IgA 1 <4 U/mL  Endomysial ab IgA rflx titer  Result Value Ref Range   Endomysial Screen NEGATIVE NEGATIVE  Tissue transglutaminase, IgG  Result Value Ref Range   Tissue Transglut Ab 4 <6 U/mL  Gliadin antibodies, serum  Result Value Ref Range   Gliadin  IgG 8 <20 Units   Gliadin IgA 13 <20 Units  POCT urinalysis dipstick  Result Value Ref Range   Color, UA yellow yellow   Clarity, UA clear clear   Glucose, UA negative negative   Bilirubin, UA negative negative   Ketones, POC UA negative negative   Spec Grav, UA 1.015    Blood, UA small (A) negative   pH, UA 6.5    Protein Ur, POC negative negative   Urobilinogen, UA 0.2    Nitrite, UA Negative Negative   Leukocytes, UA Trace (A) Negative  POCT Microscopic Urinalysis ([facility])  Result Value Ref Range    WBC,UR,HPF,POC Few (A) None WBC/hpf   RBC,UR,HPF,POC None None RBC/hpf   Bacteria Few (A) None, Too numerous to count   Mucus Absent Absent   Epithelial Cells, UR Per Microscopy None None, Too numerous to count cells/hpf   Dg Abd Acute W/chest  Result Date: 01/17/2016 CLINICAL DATA:  Abdominal bloating with constipation EXAM: DG ABDOMEN ACUTE W/ 1V CHEST COMPARISON:  CT 01/11/2015, radiograph 01/09/2015, chest x-ray 01/08/2013, 03/21/2010 FINDINGS: PA view chest demonstrates hyperinflation. Biapical pleural-parenchymal scarring with calcified pleural plaque at the right apex. A nodular opacity in the peripheral right upper lobe is unchanged. No acute consolidation or effusion. Stable cardiomediastinal silhouette with atherosclerosis. Supine and upright views of the abdomen demonstrate no free air beneath the diaphragm. Nonspecific nonobstructed bowel gas pattern with moderate stool in the colon. Partially visualized hardware in the proximal femurs. IMPRESSION: 1. Hyperinflation with staple right upper lobe lung nodule. No acute infiltrate or edema. 2. Atherosclerotic vascular calcification of the aorta. 3. Nonspecific gas pattern.  Moderate stool in the colon. Electronically Signed   By: Donavan Foil M.D.   On: 01/17/2016 15:03       Assessment & Plan:   1. Abdominal bloating   2. Slow transit constipation   3. Irritable bowel syndrome with both constipation and diarrhea    -worsening. -start Hardin Negus MOM; if no improvement in 3-4 days, switch to Peri-colace bid. -start exercising daily. -increase water intake each day. -stop Zantac. -if no improvement with above regimen, will obtain HIDA scan.   Orders Placed This Encounter  Procedures  . DG Abd Acute W/Chest    Standing Status:   Future    Number of Occurrences:   1    Standing Expiration Date:   01/16/2017    Order Specific Question:   Reason for exam:    Answer:   abdominal bloating; constipation    Order Specific Question:    Preferred imaging location?    Answer:   External   No orders of the defined types were placed in this encounter.   No Follow-up on file.   Kristi Elayne Guerin, M.D. [facility] [address] [place], [place]  [address] [phone] phone [phone] fax

## 2016-01-17 NOTE — Patient Instructions (Addendum)
Restart Hardin Negus and take as recommended on bottle for one week. If that does not work, Administrator. Recommend Peri-colace 1 tablet twice daily for constipation.   Stop Zantac.   Start walking daily.   Constipation, Adult Constipation is when a person has fewer than three bowel movements a week, has difficulty having a bowel movement, or has stools that are dry, hard, or larger than normal. As people grow older, constipation is more common. A low-fiber diet, not taking in enough fluids, and taking certain medicines may make constipation worse.  CAUSES   Certain medicines, such as antidepressants, pain medicine, iron supplements, antacids, and water pills.   Certain diseases, such as diabetes, irritable bowel syndrome (IBS), thyroid disease, or depression.   Not drinking enough water.   Not eating enough fiber-rich foods.   Stress or travel.   Lack of physical activity or exercise.   Ignoring the urge to have a bowel movement.   Using laxatives too much.  SIGNS AND SYMPTOMS   Having fewer than three bowel movements a week.   Straining to have a bowel movement.   Having stools that are hard, dry, or larger than normal.   Feeling full or bloated.   Pain in the lower abdomen.   Not feeling relief after having a bowel movement.  DIAGNOSIS  Your health care provider will take a medical history and perform a physical exam. Further testing may be done for severe constipation. Some tests may include:  A barium enema X-ray to examine your rectum, colon, and, sometimes, your small intestine.   A sigmoidoscopy to examine your lower colon.   A colonoscopy to examine your entire colon. TREATMENT  Treatment will depend on the severity of your constipation and what is causing it. Some dietary treatments include drinking more fluids and eating more fiber-rich foods. Lifestyle treatments may include regular exercise. If these diet and lifestyle recommendations do  not help, your health care provider may recommend taking over-the-counter laxative medicines to help you have bowel movements. Prescription medicines may be prescribed if over-the-counter medicines do not work.  HOME CARE INSTRUCTIONS   Eat foods that have a lot of fiber, such as fruits, vegetables, whole grains, and beans.  Limit foods high in fat and processed sugars, such as french fries, hamburgers, cookies, candies, and soda.   A fiber supplement may be added to your diet if you cannot get enough fiber from foods.   Drink enough fluids to keep your urine clear or pale yellow.   Exercise regularly or as directed by your health care provider.   Go to the restroom when you have the urge to go. Do not hold it.   Only take over-the-counter or prescription medicines as directed by your health care provider. Do not take other medicines for constipation without talking to your health care provider first.  Fidelity IF:   You have bright red blood in your stool.   Your constipation lasts for more than 4 days or gets worse.   You have abdominal or rectal pain.   You have thin, pencil-like stools.   You have unexplained weight loss. MAKE SURE YOU:   Understand these instructions.  Will watch your condition.  Will get help right away if you are not doing well or get worse.   This information is not intended to replace advice given to you by your health care provider. Make sure you discuss any questions you have with your health care provider.  Document Released: 12/20/2003 Document Revised: 04/13/2014 Document Reviewed: 01/02/2013 Elsevier Interactive Patient Education 2016 [employer].     IF you received an x-ray today, you will receive an invoice from [facility]. Please contact [facility] at [phone] with questions or concerns regarding your invoice.   IF you received labwork today, you will receive an invoice from  [employer]. Please contact [facility] at [phone] with questions or concerns regarding your invoice.   Our billing staff will not be able to assist you with questions regarding bills from these companies.  You will be contacted with the lab results as soon as they are available. The fastest way to get your results is to activate your My Chart account. Instructions are located on the last page of this paperwork. If you have not heard from [place] regarding the results in 2 weeks, please contact this office.

## 2016-01-20 ENCOUNTER — Other Ambulatory Visit: Payer: Self-pay | Admitting: Family Medicine

## 2016-01-20 ENCOUNTER — Telehealth: Payer: Self-pay

## 2016-01-20 NOTE — Telephone Encounter (Signed)
Smith - Pt says you want her on 45 mg of thyroid medication.  She has checked several pharmacies, but no one has it.  She says her store has the 30 mg and a 15 mg of the Armour.  She has the 30 mg and would just need a prescription for the 15 mg. [phone]

## 2016-01-20 NOTE — Telephone Encounter (Signed)
Dr. Smith please review 

## 2016-01-23 NOTE — Telephone Encounter (Signed)
Pt says none of the laxatives are working she is having no stool and believes there may be a blockage and does not want to wait until to 10/25 to see Dr. Tamala Julian pt does not know what she needs to do     [phone]

## 2016-01-24 MED ORDER — THYROID 15 MG PO TABS: 15.0000 mg | ORAL_TABLET | Freq: Every day | ORAL | 5 refills | Status: DC

## 2016-01-24 NOTE — Telephone Encounter (Signed)
Called patient and checked on her this morning. Patient stated she was able to have a bowel movement last night around 11pm and now feels a lot better. She wanted to know if she had an appt on 10/25 and I explained to her that she did not but I could schedule one for her. Pt. Declined. She stated that she would call back if the pain returned.

## 2016-01-24 NOTE — Telephone Encounter (Signed)
Armour Thyroid 15mg  has been sent to [facility].  Please advise patient.

## 2016-01-27 NOTE — Telephone Encounter (Signed)
Patient aware via phone.

## 2016-01-29 ENCOUNTER — Ambulatory Visit: Payer: Medicare Other | Admitting: Family Medicine

## 2016-01-29 ENCOUNTER — Ambulatory Visit (INDEPENDENT_AMBULATORY_CARE_PROVIDER_SITE_OTHER): Payer: Medicare Other | Admitting: Family Medicine

## 2016-01-29 VITALS — BP 104/64 | HR 79 | Temp 97.9°F | Resp 17 | Ht 61.5 in | Wt 93.0 lb

## 2016-01-29 DIAGNOSIS — R1032 Left lower quadrant pain: Secondary | ICD-10-CM

## 2016-01-29 DIAGNOSIS — K5901 Slow transit constipation: Secondary | ICD-10-CM | POA: Diagnosis not present

## 2016-01-29 DIAGNOSIS — E034 Atrophy of thyroid (acquired): Secondary | ICD-10-CM | POA: Diagnosis not present

## 2016-01-29 NOTE — Progress Notes (Signed)
Subjective:    Patient ID: Shelia Fernandez, female    DOB: 03-13-1935, 81 y.o.   MRN: [id number]  01/29/2016  Abdominal Pain   HPI This 81 y.o. female presents for twelve day follow-up of abdominal pain, bloating.  Has not had a normal bowel movement in two weeks; passing very small amounts of stool daily.  Previously, had three bowel movements per day.  Bowel habits changed three months ago for no reason; no change in diet or activity.  No change in medications. Hardin Negus four or five days; one daily at bedtime; Miralax once daily for one week; then Pericolace 1 bid for one week; then increased to 2 bid for past two days.  Little gas.  Last small stool two days ago. Urge to go every morning and then that is it. Mild nausea; no vomiting; no appetite. Walking every day; on feet all morning.  Stopped Zantac.  Worried about cancer; maternal aunt died of mass wrapped around colon and caused obstruction.  S/p AAS at last visit; s/p abdominal US in the past month; s/p H. Pylori testing negative.   Review of Systems  Constitutional: Negative for chills, diaphoresis, fatigue and fever.  Eyes: Negative for visual disturbance.  Respiratory: Negative for cough and shortness of breath.   Cardiovascular: Negative for chest pain, palpitations and leg swelling.  Gastrointestinal: Positive for abdominal distention, abdominal pain, constipation and nausea. Negative for anal bleeding, blood in stool, diarrhea, rectal pain and vomiting.  Endocrine: Negative for cold intolerance, heat intolerance, polydipsia, polyphagia and polyuria.  Genitourinary: Negative for dysuria, flank pain, genital sores, hematuria, pelvic pain and urgency.  Neurological: Negative for dizziness, tremors, seizures, syncope, facial asymmetry, speech difficulty, weakness, light-headedness, numbness and headaches.    Past Medical History:  Diagnosis Date  . Asthma   . Benign positional vertigo    s/p ENT consultation with [facility]  maneuvers 2014.  [facility].  . Cataract   . COPD (chronic obstructive pulmonary disease) ([place])   . Diarrhea   . GERD (gastroesophageal reflux disease)   . Myocardial infarction   . Osteoporosis    R hip fracture; L hip fracture.  Intolerance to bisphosphonates; GI upset.  Took bisphosphonates x 10 years; last bisphosphonates 2000.  s/p L wrist fracture 2001.  Marland Kitchen Pancreas anomaly, congenital   . Thyroid disease    Hypothyroidism   Past Surgical History:  Procedure Laterality Date  . APPENDECTOMY    . COLONOSCOPY     x 2; failure due to spasms.  [facility]. [facility].  Marland Kitchen ENT consult     +BPV; [facility] maneuvers wtih improvement.  [facility].  Marland Kitchen FRACTURE SURGERY     R hip fracture s/p ORIF; L hip fracture s/p ORIF.  Marland Kitchen TUBAL LIGATION     Allergies  Allergen Reactions  . Biaxin [Clarithromycin]     Had a strong a reaction to it (can't remember specifically)  . Gluten Meal     Stomach upset  . Lactose Intolerance (Gi) Nausea And Vomiting  . Sudafed [Pseudoephedrine Hcl]     Can't recall reaction  . Penicillins Hives and Rash  . Sulfa Antibiotics Hives and Rash   Current Outpatient Prescriptions  Medication Sig Dispense Refill  . albuterol (PROVENTIL HFA;VENTOLIN HFA) 108 (90 Base) MCG/ACT inhaler Inhale 2 puffs into the lungs every 6 (six) hours as needed for wheezing or shortness of breath. 1 Inhaler 2  . Calcium Citrate-Vitamin D (CALCIUM CITRATE + D PO) Take 1 tablet by mouth daily.    Marland Kitchen  Ipratropium-Albuterol (COMBIVENT) 20-100 MCG/ACT AERS respimat Inhale 1 puff into the lungs every 6 (six) hours. 4 g 11  . Multiple Vitamin (MULTI-VITAMINS) TABS Take 1 tablet by mouth.     . thyroid (ARMOUR THYROID) 15 MG tablet Take 1 tablet (15 mg total) by mouth daily. 30 tablet 5   No current facility-administered medications for this visit.    Social History   Social History  . Marital status: Widowed    Spouse name: N/A  . Number of children: 1  . Years of education: N/A   Occupational  History  . retired    Social History Main Topics  . Smoking status: Passive Smoke Exposure - Never Smoker  . Smokeless tobacco: Never Used     Comment: pt's father and husband both smoked in house.   Marland Kitchen Alcohol use No  . Drug use: No  . Sexual activity: Not Currently   Other Topics Concern  . Not on file   Social History Narrative   Marital status: widowed x 1997 after 30+ years; not dating; not interested.       Children:  1 daughter; 2 granddaughters; no gg.      Lives:  With daughter [name]), son-in-law      Tobacco: never; husband smoked      Alcohol: none      Exercise:  Walking three to four days per week at [facility] for 30 minutes.      ADLs:  No assistant devices; has a cane for PRN use.  Drives; goes to grocery store; no cleaning; no bill paying; daughter pays bills.        Advanced Directives:  +LIVING WILL; +FULL CODE.  HCPOA: daughter(Shelley).       Volunteers at [facility] once per week; visits sister in [place] once weekly; plays bridge two days per week at neighbors or home; has small church group on Wednesdays; goes to church on Sundays.    Family History  Problem Relation Age of Onset  . Cancer Sister     Terminal cancer in 2017; Breast cancer; endometrial cancer; vaginal cancer  . COPD Sister   . Cancer Sister     Breast cancer  . Cancer Mother 59    cervical cancer  . Asthma Mother   . COPD Father   . Cancer Sister     Melanoma       Objective:    BP 104/64 (BP Location: Right Arm, Patient Position: Sitting, Cuff Size: Normal)   Pulse 79   Temp 97.9 F (36.6 C) (Oral)   Resp 17   Ht 5' 1.5" (1.562 m)   Wt 93 lb (42.2 kg)   SpO2 97%   BMI 17.29 kg/m  Physical Exam  Constitutional: She is oriented to person, place, and time. She appears well-developed and well-nourished. No distress.  HENT:  Head: Normocephalic and atraumatic.  Right Ear: External ear normal.  Left Ear: External ear normal.  Nose: Nose normal.  Mouth/Throat:  Oropharynx is clear and moist.  Eyes: Conjunctivae and EOM are normal. Pupils are equal, round, and reactive to light.  Neck: Normal range of motion. Neck supple. Carotid bruit is not present. No thyromegaly present.  Cardiovascular: Normal rate, regular rhythm, normal heart sounds and intact distal pulses.  Exam reveals no gallop and no friction rub.   No murmur heard. Pulmonary/Chest: Effort normal and breath sounds normal. She has no wheezes. She has no rales.  Abdominal: Soft. Bowel sounds are normal. She exhibits  no distension and no mass. There is generalized tenderness. There is no rebound and no guarding.  Genitourinary: Rectum normal.  Genitourinary Comments: No rectal impaction or mass.  Lymphadenopathy:    She has no cervical adenopathy.  Neurological: She is alert and oriented to person, place, and time. No cranial nerve deficit.  Skin: Skin is warm and dry. No rash noted. She is not diaphoretic. No erythema. No pallor.  Psychiatric: She has a normal mood and affect. Her behavior is normal.        Assessment & Plan:   1. Abdominal pain, LLQ   2. Slow transit constipation   3. Hypothyroidism due to acquired atrophy of thyroid    -worsening. -continue Pericolace two bid; add Miralax bid.   -obtain CT abd/pelvis. -repeat labs.   Orders Placed This Encounter  Procedures  . CT Abdomen Pelvis W Contrast    Standing Status:   Future    Number of Occurrences:   1    Standing Expiration Date:   04/30/2017    Order Specific Question:   If indicated for the ordered procedure, I authorize the administration of contrast media per Radiology protocol    Answer:   Yes    Order Specific Question:   Reason for Exam (SYMPTOM  OR DIAGNOSIS REQUIRED)    Answer:   abdominal pain, constipation    Order Specific Question:   Preferred imaging location?    Answer:   [facility]  . CBC with Differential/Platelet  . Comprehensive metabolic panel  . TSH  . T4, free   No orders of  the defined types were placed in this encounter.   No Follow-up on file.   Kristi Elayne Guerin, M.D. [facility] [address] [place], [place]  [address] [phone] phone [phone] fax

## 2016-01-29 NOTE — Patient Instructions (Addendum)
1.  Enema tonight. 2.  Start Miralax twice daily. 3.  Continue pericolace  Constipation, Adult Constipation is when a person has fewer than three bowel movements a week, has difficulty having a bowel movement, or has stools that are dry, hard, or larger than normal. As people grow older, constipation is more common. A low-fiber diet, not taking in enough fluids, and taking certain medicines may make constipation worse.  CAUSES   Certain medicines, such as antidepressants, pain medicine, iron supplements, antacids, and water pills.   Certain diseases, such as diabetes, irritable bowel syndrome (IBS), thyroid disease, or depression.   Not drinking enough water.   Not eating enough fiber-rich foods.   Stress or travel.   Lack of physical activity or exercise.   Ignoring the urge to have a bowel movement.   Using laxatives too much.  SIGNS AND SYMPTOMS   Having fewer than three bowel movements a week.   Straining to have a bowel movement.   Having stools that are hard, dry, or larger than normal.   Feeling full or bloated.   Pain in the lower abdomen.   Not feeling relief after having a bowel movement.  DIAGNOSIS  Your health care provider will take a medical history and perform a physical exam. Further testing may be done for severe constipation. Some tests may include:  A barium enema X-ray to examine your rectum, colon, and, sometimes, your small intestine.   A sigmoidoscopy to examine your lower colon.   A colonoscopy to examine your entire colon. TREATMENT  Treatment will depend on the severity of your constipation and what is causing it. Some dietary treatments include drinking more fluids and eating more fiber-rich foods. Lifestyle treatments may include regular exercise. If these diet and lifestyle recommendations do not help, your health care provider may recommend taking over-the-counter laxative medicines to help you have bowel movements.  Prescription medicines may be prescribed if over-the-counter medicines do not work.  HOME CARE INSTRUCTIONS   Eat foods that have a lot of fiber, such as fruits, vegetables, whole grains, and beans.  Limit foods high in fat and processed sugars, such as french fries, hamburgers, cookies, candies, and soda.   A fiber supplement may be added to your diet if you cannot get enough fiber from foods.   Drink enough fluids to keep your urine clear or pale yellow.   Exercise regularly or as directed by your health care provider.   Go to the restroom when you have the urge to go. Do not hold it.   Only take over-the-counter or prescription medicines as directed by your health care provider. Do not take other medicines for constipation without talking to your health care provider first.  [place] IF:   You have bright red blood in your stool.   Your constipation lasts for more than 4 days or gets worse.   You have abdominal or rectal pain.   You have thin, pencil-like stools.   You have unexplained weight loss. MAKE SURE YOU:   Understand these instructions.  Will watch your condition.  Will get help right away if you are not doing well or get worse.   This information is not intended to replace advice given to you by your health care provider. Make sure you discuss any questions you have with your health care provider.   Document Released: 12/20/2003 Document Revised: 04/13/2014 Document Reviewed: 01/02/2013 Elsevier Interactive Patient Education 2016 [employer].     IF you  received an x-ray today, you will receive an invoice from [facility]. Please contact [facility] at [phone] with questions or concerns regarding your invoice.   IF you received labwork today, you will receive an invoice from [employer]. Please contact [facility] at [phone] with questions or concerns regarding your invoice.    Our billing staff will not be able to assist you with questions regarding bills from these companies.  You will be contacted with the lab results as soon as they are available. The fastest way to get your results is to activate your My Chart account. Instructions are located on the last page of this paperwork. If you have not heard from [place] regarding the results in 2 weeks, please contact this office.

## 2016-01-30 LAB — CBC WITH DIFFERENTIAL/PLATELET
BASOS ABS: 58 {cells}/uL (ref 0–200)
Basophils Relative: 1 %
EOS ABS: 116 {cells}/uL (ref 15–500)
Eosinophils Relative: 2 %
HEMATOCRIT: 39.3 % (ref 35.0–45.0)
Hemoglobin: 13.3 g/dL (ref 11.7–15.5)
LYMPHS PCT: 33 %
Lymphs Abs: 1914 cells/uL (ref 850–3900)
MCH: 27.5 pg (ref 27.0–33.0)
MCHC: 33.8 g/dL (ref 32.0–36.0)
MCV: 81.4 fL (ref 80.0–100.0)
MONO ABS: 522 {cells}/uL (ref 200–950)
MONOS PCT: 9 %
MPV: 10.2 fL (ref 7.5–12.5)
NEUTROS PCT: 55 %
Neutro Abs: 3190 cells/uL (ref 1500–7800)
PLATELETS: 305 10*3/uL (ref 140–400)
RBC: 4.83 MIL/uL (ref 3.80–5.10)
RDW: 13.3 % (ref 11.0–15.0)
WBC: 5.8 10*3/uL (ref 3.8–10.8)

## 2016-01-30 LAB — COMPREHENSIVE METABOLIC PANEL
ALK PHOS: 49 U/L (ref 33–130)
ALT: 17 U/L (ref 6–29)
AST: 24 U/L (ref 10–35)
Albumin: 4.3 g/dL (ref 3.6–5.1)
BUN: 17 mg/dL (ref 7–25)
CALCIUM: 9.9 mg/dL (ref 8.6–10.4)
CHLORIDE: 103 mmol/L (ref 98–110)
CO2: 25 mmol/L (ref 20–31)
Creat: 0.57 mg/dL — ABNORMAL LOW (ref 0.60–0.93)
GLUCOSE: 90 mg/dL (ref 65–99)
POTASSIUM: 4.6 mmol/L (ref 3.5–5.3)
Sodium: 140 mmol/L (ref 135–146)
Total Bilirubin: 0.4 mg/dL (ref 0.2–1.2)
Total Protein: 7.6 g/dL (ref 6.1–8.1)

## 2016-01-30 LAB — TSH: TSH: 0.07 mIU/L — ABNORMAL LOW

## 2016-01-30 LAB — T4, FREE: Free T4: 1.1 ng/dL (ref 0.8–1.8)

## 2016-02-03 ENCOUNTER — Ambulatory Visit
Admission: RE | Admit: 2016-02-03 | Discharge: 2016-02-03 | Disposition: A | Discharge status: Home / Self Care | Payer: Medicare Other | Source: Ambulatory Visit | Attending: Family Medicine | Admitting: Family Medicine

## 2016-02-03 ENCOUNTER — Ambulatory Visit: Payer: Medicare Other

## 2016-02-03 DIAGNOSIS — I700 Atherosclerosis of aorta: Secondary | ICD-10-CM | POA: Diagnosis not present

## 2016-02-03 DIAGNOSIS — K5901 Slow transit constipation: Secondary | ICD-10-CM | POA: Diagnosis not present

## 2016-02-03 DIAGNOSIS — K5730 Diverticulosis of large intestine without perforation or abscess without bleeding: Secondary | ICD-10-CM | POA: Insufficient documentation

## 2016-02-03 DIAGNOSIS — D1803 Hemangioma of intra-abdominal structures: Secondary | ICD-10-CM | POA: Insufficient documentation

## 2016-02-03 DIAGNOSIS — K7689 Other specified diseases of liver: Secondary | ICD-10-CM | POA: Diagnosis not present

## 2016-02-03 DIAGNOSIS — R918 Other nonspecific abnormal finding of lung field: Secondary | ICD-10-CM | POA: Insufficient documentation

## 2016-02-03 DIAGNOSIS — R1032 Left lower quadrant pain: Secondary | ICD-10-CM

## 2016-02-03 DIAGNOSIS — D71 Functional disorders of polymorphonuclear neutrophils: Secondary | ICD-10-CM | POA: Diagnosis not present

## 2016-02-03 DIAGNOSIS — K760 Fatty (change of) liver, not elsewhere classified: Secondary | ICD-10-CM | POA: Diagnosis not present

## 2016-02-03 HISTORY — DX: Unspecified asthma, uncomplicated: J45.909

## 2016-02-03 MED ORDER — IOPAMIDOL (ISOVUE-300) INJECTION 61%
75.0000 mL | Freq: Once | INTRAVENOUS | Status: AC | PRN
  Administered 2016-02-03: 75 mL via INTRAVENOUS

## 2016-02-05 ENCOUNTER — Ambulatory Visit: Payer: Medicare Other

## 2016-02-10 ENCOUNTER — Encounter: Payer: Self-pay | Admitting: Family Medicine

## 2016-02-15 ENCOUNTER — Ambulatory Visit: Payer: Medicare Other

## 2016-05-20 ENCOUNTER — Encounter: Payer: Self-pay | Admitting: Family Medicine

## 2016-05-20 ENCOUNTER — Ambulatory Visit (INDEPENDENT_AMBULATORY_CARE_PROVIDER_SITE_OTHER): Payer: Medicare Other | Admitting: Family Medicine

## 2016-05-20 VITALS — BP 90/60 | HR 90 | Temp 97.8°F | Resp 18 | Ht 61.5 in | Wt 93.4 lb

## 2016-05-20 DIAGNOSIS — M810 Age-related osteoporosis without current pathological fracture: Secondary | ICD-10-CM | POA: Diagnosis not present

## 2016-05-20 DIAGNOSIS — Z1231 Encounter for screening mammogram for malignant neoplasm of breast: Secondary | ICD-10-CM

## 2016-05-20 DIAGNOSIS — R636 Underweight: Secondary | ICD-10-CM | POA: Diagnosis not present

## 2016-05-20 DIAGNOSIS — E7800 Pure hypercholesterolemia, unspecified: Secondary | ICD-10-CM | POA: Diagnosis not present

## 2016-05-20 DIAGNOSIS — J431 Panlobular emphysema: Secondary | ICD-10-CM

## 2016-05-20 DIAGNOSIS — E034 Atrophy of thyroid (acquired): Secondary | ICD-10-CM | POA: Diagnosis not present

## 2016-05-20 DIAGNOSIS — Z0000 Encounter for general adult medical examination without abnormal findings: Secondary | ICD-10-CM | POA: Diagnosis not present

## 2016-05-20 DIAGNOSIS — K219 Gastro-esophageal reflux disease without esophagitis: Secondary | ICD-10-CM | POA: Diagnosis not present

## 2016-05-20 LAB — POCT URINALYSIS DIP (MANUAL ENTRY)
BILIRUBIN UA: NEGATIVE
Blood, UA: NEGATIVE
Glucose, UA: NEGATIVE
Nitrite, UA: NEGATIVE
PH UA: 6
Protein Ur, POC: NEGATIVE
Spec Grav, UA: 1.015
Urobilinogen, UA: 0.2

## 2016-05-20 MED ORDER — THYROID 30 MG PO TABS: 30.0000 mg | ORAL_TABLET | Freq: Every day | ORAL | 3 refills | Status: DC

## 2016-05-20 MED ORDER — THYROID 15 MG PO TABS
15.0000 mg | ORAL_TABLET | Freq: Every day | ORAL | 3 refills | Status: DC
Start: 2016-05-20 — End: 2017-07-28

## 2016-05-20 MED ORDER — IPRATROPIUM-ALBUTEROL 20-100 MCG/ACT IN AERS: 1.0000 | INHALATION_SPRAY | Freq: Four times a day (QID) | RESPIRATORY_TRACT | 11 refills | Status: DC

## 2016-05-20 NOTE — Progress Notes (Signed)
Subjective:    Patient ID: Shelia Fernandez, female    DOB: 09/21/1934, 82 y.o.   MRN: [id number]  05/20/2016  Annual Exam   HPI This 82 y.o. female presents for evaluation of Annual Wellness Examination, complete physical examination, and follow-up of chronic medical condition.  Last physical:  05-21-2015 Pap smear: n/a; [place] Mammogram:   2016; [place] Colonoscopy:  cannot Bone density:  08-22-2014; not interested in repeat. Eye exam: Dental exam:  Immunization History  Administered Date(s) Administered  . Influenza Split 01/31/2013, 01/04/2014  . Influenza,inj,Quad PF,36+ Mos 01/09/2015  . Pneumococcal Conjugate-13 06/04/2013  . Pneumococcal Polysaccharide-23 05/21/2015  . Tdap 04/06/2006  . Zoster 04/07/2007    BP Readings from Last 3 Encounters:  05/20/16 90/60  01/29/16 104/64  01/17/16 (!) 94/50   Wt Readings from Last 3 Encounters:  05/20/16 93 lb 6.4 oz (42.4 kg)  01/29/16 93 lb (42.2 kg)  01/17/16 95 lb (43.1 kg)    Constipation/colon problems: still having colon problems; GI recommended enzymes; s/p CT abdomen/pelvis.  S/p CT abd/pelvis, AAS, abdominal US.  Going 2-3 times per day.  No stool softeners.  Adjusting thyroid helped.  Hypothyroidism: 45mg  working well.  Patient reports good compliance with medication, good tolerance to medication, and good symptom control.   Increased appetite.    Osteoporosis: cannot tolerate Fosamax; esophagitis.  Relcast causes jaw problems and femur issues; sister took Reclast and suffered with osteonecrosis.  Taking 1000 IU Vitamin D.  Clacium 800-1000mg  daily; also drinks milk one glass at lunch and some milk with oatmeal.  Some walking due to knee pain.  Mall concrete causing knee pain.    COPD: rarely using Albuterol.  Combivent every 6 hours scheduled.   Review of Systems  Constitutional: Negative for activity change, appetite change, chills, diaphoresis, fatigue, fever and unexpected weight change.  HENT: Negative  for congestion, dental problem, drooling, ear discharge, ear pain, facial swelling, hearing loss, mouth sores, nosebleeds, postnasal drip, rhinorrhea, sinus pressure, sneezing, sore throat, tinnitus, trouble swallowing and voice change.   Eyes: Negative for photophobia, pain, discharge, redness, itching and visual disturbance.  Respiratory: Negative for apnea, cough, choking, chest tightness, shortness of breath, wheezing and stridor.   Cardiovascular: Negative for chest pain, palpitations and leg swelling.  Gastrointestinal: Negative for abdominal distention, abdominal pain, anal bleeding, blood in stool, constipation, diarrhea, nausea, rectal pain and vomiting.  Endocrine: Negative for cold intolerance, heat intolerance, polydipsia, polyphagia and polyuria.  Genitourinary: Negative for decreased urine volume, difficulty urinating, dyspareunia, dysuria, enuresis, flank pain, frequency, genital sores, hematuria, menstrual problem, pelvic pain, urgency, vaginal bleeding, vaginal discharge and vaginal pain.       Nocturia x 1.  Urinary incontinence YES if waits too long.  Musculoskeletal: Negative for arthralgias, back pain, gait problem, joint swelling, myalgias, neck pain and neck stiffness.  Skin: Negative for color change, pallor, rash and wound.  Allergic/Immunologic: Negative for environmental allergies, food allergies and immunocompromised state.  Neurological: Negative for dizziness, tremors, seizures, syncope, facial asymmetry, speech difficulty, weakness, light-headedness, numbness and headaches.  Hematological: Negative for adenopathy. Does not bruise/bleed easily.  Psychiatric/Behavioral: Negative for agitation, behavioral problems, confusion, decreased concentration, dysphoric mood, hallucinations, self-injury, sleep disturbance and suicidal ideas. The patient is not nervous/anxious and is not hyperactive.        Bedtime 9-11; wakes up 7:30-8.    Past Medical History:  Diagnosis Date  .  Asthma   . Benign positional vertigo    s/p ENT consultation with Eply maneuvers 2014.  [facility].  . Cataract   . COPD (chronic obstructive pulmonary disease) ([place])   . Diarrhea   . GERD (gastroesophageal reflux disease)   . Myocardial infarction   . Osteoporosis    R hip fracture; L hip fracture.  Intolerance to bisphosphonates; GI upset.  Took bisphosphonates x 10 years; last bisphosphonates 2000.  s/p L wrist fracture 2001.  Marland Kitchen Pancreas anomaly, congenital   . Thyroid disease    Hypothyroidism   Past Surgical History:  Procedure Laterality Date  . APPENDECTOMY    . COLONOSCOPY     x 2; failure due to spasms.  [facility]. [facility].  Marland Kitchen ENT consult     +BPV; Eply maneuvers wtih improvement.  [facility].  Marland Kitchen FRACTURE SURGERY     R hip fracture s/p ORIF; L hip fracture s/p ORIF.  Marland Kitchen TUBAL LIGATION     Allergies  Allergen Reactions  . Biaxin [Clarithromycin]     Had a strong a reaction to it (can't remember specifically)  . Gluten Meal     Stomach upset  . Lactose Intolerance (Gi) Nausea And Vomiting  . Sudafed [Pseudoephedrine Hcl]     Can't recall reaction  . Penicillins Hives and Rash  . Sulfa Antibiotics Hives and Rash   Current Outpatient Prescriptions  Medication Sig Dispense Refill  . albuterol (PROVENTIL HFA;VENTOLIN HFA) 108 (90 Base) MCG/ACT inhaler Inhale 2 puffs into the lungs every 6 (six) hours as needed for wheezing or shortness of breath. 1 Inhaler 2  . Calcium Citrate-Vitamin D (CALCIUM CITRATE + D PO) Take 1 tablet by mouth daily.    . Ipratropium-Albuterol (COMBIVENT) 20-100 MCG/ACT AERS respimat Inhale 1 puff into the lungs every 6 (six) hours. 4 g 11  . Multiple Vitamin (MULTI-VITAMINS) TABS Take 1 tablet by mouth.     . thyroid (ARMOUR THYROID) 15 MG tablet Take 1 tablet (15 mg total) by mouth daily. 90 tablet 3  . thyroid (ARMOUR) 30 MG tablet Take 1 tablet (30 mg total) by mouth daily before breakfast. 90 tablet 3   No current facility-administered  medications for this visit.    Social History   Social History  . Marital status: Widowed    Spouse name: N/A  . Number of children: 1  . Years of education: N/A   Occupational History  . retired    Social History Main Topics  . Smoking status: Passive Smoke Exposure - Never Smoker  . Smokeless tobacco: Never Used     Comment: pt's father and husband both smoked in house.   Marland Kitchen Alcohol use No  . Drug use: No  . Sexual activity: Not Currently   Other Topics Concern  . Not on file   Social History Narrative   Marital status: widowed x 1997 after 30+ years; not dating; not interested.       Children:  1 daughter; 2 granddaughters; no gg.      Lives:  With daughter [name]), son-in-law      Tobacco: never; husband smoked      Alcohol: none      Exercise:  Walking three to four days per week at [facility] for 30 minutes.      ADLs:  No assistant devices; has a cane for PRN use.  Drives; goes to grocery store; no cleaning; no bill paying; daughter pays bills.        Advanced Directives:  +LIVING WILL; +FULL CODE.  HCPOA: daughter(Shelley).       Volunteers at [employer] once  per week; visits; plays bridge two days per week at neighbors or home; has small church group on Wednesdays; goes to church on Sundays.    Family History  Problem Relation Age of Onset  . Cancer Sister     Terminal cancer in 2017; Breast cancer; endometrial cancer; vaginal cancer  . COPD Sister   . Cancer Sister     Breast cancer  . Cancer Mother 47    cervical cancer  . Asthma Mother   . COPD Father   . Cancer Sister     Melanoma       Objective:    BP 90/60 (BP Location: Right Arm, Patient Position: Sitting, Cuff Size: Small)   Pulse 90   Temp 97.8 F (36.6 C) (Oral)   Resp 18   Ht 5' 1.5" (1.562 m)   Wt 93 lb 6.4 oz (42.4 kg)   SpO2 97%   BMI 17.36 kg/m  Physical Exam  Constitutional: She is oriented to person, place, and time. She appears well-developed and well-nourished. No distress.    HENT:  Head: Normocephalic and atraumatic.  Right Ear: External ear normal.  Left Ear: External ear normal.  Nose: Nose normal.  Mouth/Throat: Oropharynx is clear and moist.  Eyes: Conjunctivae and EOM are normal. Pupils are equal, round, and reactive to light.  Neck: Normal range of motion and full passive range of motion without pain. Neck supple. No JVD present. Carotid bruit is not present. No thyromegaly present.  Cardiovascular: Normal rate, regular rhythm and normal heart sounds.  Exam reveals no gallop and no friction rub.   No murmur heard. Pulmonary/Chest: Effort normal and breath sounds normal. She has no wheezes. She has no rales. Right breast exhibits no inverted nipple, no mass, no nipple discharge, no skin change and no tenderness. Left breast exhibits no inverted nipple, no mass, no nipple discharge, no skin change and no tenderness. Breasts are symmetrical.  Abdominal: Soft. Bowel sounds are normal. She exhibits no distension and no mass. There is no tenderness. There is no rebound and no guarding.  Musculoskeletal:       Right shoulder: Normal.       Left shoulder: Normal.       Cervical back: Normal.  Lymphadenopathy:    She has no cervical adenopathy.  Neurological: She is alert and oriented to person, place, and time. She has normal reflexes. No cranial nerve deficit. She exhibits normal muscle tone. Coordination normal.  Skin: Skin is warm and dry. No rash noted. She is not diaphoretic. No erythema. No pallor.  Psychiatric: She has a normal mood and affect. Her behavior is normal. Judgment and thought content normal.  Nursing note and vitals reviewed.  Depression screen [facility] 2/9 05/20/2016 05/20/2016 01/29/2016 01/17/2016 01/08/2016  Decreased Interest 0 0 0 0 0  Down, Depressed, Hopeless 0 0 0 0 0  PHQ - 2 Score 0 0 0 0 0   Fall Risk  05/20/2016 05/20/2016 01/29/2016 01/08/2016 11/20/2015  Falls in the past year? No No No No No   Functional Status Survey: Is the  patient deaf or have difficulty hearing?: No Does the patient have difficulty seeing, even when wearing glasses/contacts?: Yes Does the patient have difficulty concentrating, remembering, or making decisions?: No Does the patient have difficulty walking or climbing stairs?: No Does the patient have difficulty dressing or bathing?: No Does the patient have difficulty doing errands alone such as visiting a doctor's office or shopping?: No      Assessment &  Plan:   1. Encounter for Medicare annual wellness exam   2. Routine physical examination   3. Panlobular emphysema ([place])   4. Gastroesophageal reflux disease without esophagitis   5. Hypothyroidism due to acquired atrophy of thyroid   6. Age-related osteoporosis without current pathological fracture   7. Underweight   8. Pure hypercholesterolemia   9. Encounter for screening mammogram for breast cancer    -anticipatory guidance -obtain labs for chronic disease management; refills provide.d -pt desires ongoing breast cancer screening mammogram ordered.   Orders Placed This Encounter  Procedures  . MM SCREENING BREAST TOMO BILATERAL    Standing Status:   Future    Standing Expiration Date:   07/21/2017    Order Specific Question:   Reason for Exam (SYMPTOM  OR DIAGNOSIS REQUIRED)    Answer:   annual screening    Order Specific Question:   Preferred imaging location?    Answer:   [facility]  . CBC with Differential/Platelet  . Comprehensive metabolic panel    Order Specific Question:   Has the patient fasted?    Answer:   Yes  . Lipid panel    Order Specific Question:   Has the patient fasted?    Answer:   Yes  . T4, free  . TSH  . POCT urinalysis dipstick   Meds ordered this encounter  Medications  . thyroid (ARMOUR THYROID) 15 MG tablet    Sig: Take 1 tablet (15 mg total) by mouth daily.    Dispense:  90 tablet    Refill:  3  . Ipratropium-Albuterol (COMBIVENT) 20-100 MCG/ACT AERS respimat    Sig: Inhale 1  puff into the lungs every 6 (six) hours.    Dispense:  4 g    Refill:  11  . thyroid (ARMOUR) 30 MG tablet    Sig: Take 1 tablet (30 mg total) by mouth daily before breakfast.    Dispense:  90 tablet    Refill:  3    Return in about 1 year (around 05/20/2017) for complete physical examiniation.   Shelia Fernandez Elayne Guerin, M.D. Primary Care at [facility] [address] [place], [place]  [address] [phone] phone [phone] fax

## 2016-05-20 NOTE — Patient Instructions (Addendum)
We recommend that you schedule a mammogram for breast cancer screening. Typically, you do not need a referral to do this. Please contact a local imaging center to schedule your mammogram.  [facility] - [phone]  *ask for the Radiology Department [facility] - [phone] or [phone]  [facility] - [phone] [facility] - [phone] [facility] - [phone]  *ask for the Radiology Department [facility] - [phone]  *ask for the Radiology Department [facility] - [phone]  *ask for the Mammography Department [facility] - [phone]    IF you received an x-ray today, you will receive an invoice from [facility]. Please contact [facility] at [phone] with questions or concerns regarding your invoice.   IF you received labwork today, you will receive an invoice from [facility]. Please contact [facility] at [phone] with questions or concerns regarding your invoice.   Our billing staff will not be able to assist you with questions regarding bills from these companies.  You will be contacted with the lab results as soon as they are available. The fastest way to get your results is to activate your My Chart account. Instructions are located on the last page of this paperwork. If you have not heard from us regarding the results in 2 weeks, please contact this office.    Keeping You Healthy  Get These Tests  Blood Pressure- Have your blood pressure checked by your healthcare provider at least once a year.  Normal blood pressure is 120/80.  Weight- Have your body mass index (BMI) calculated to screen for obesity.  BMI is a measure of body fat based on height and weight.  You can calculate your own BMI at www.nhlbisupport.com/bmi/  Cholesterol- Have your cholesterol checked every year.  Diabetes- Have your blood sugar checked every  year if you have high blood pressure, high cholesterol, a family history of diabetes or if you are overweight.  Pap Test - Have a pap test every 1 to 5 years if you have been sexually active.  If you are older than 65 and recent pap tests have been normal you may not need additional pap tests.  In addition, if you have had a hysterectomy  for benign disease additional pap tests are not necessary.  Mammogram-Yearly mammograms are essential for early detection of breast cancer  Screening for Colon Cancer- Colonoscopy starting at age 50. Screening may begin sooner depending on your family history and other health conditions.  Follow up colonoscopy as directed by your Gastroenterologist.  Screening for Osteoporosis- Screening begins at age 65 with bone density scanning, sooner if you are at higher risk for developing Osteoporosis.  Get these medicines  Calcium with Vitamin D- Your body requires 1200-1500 mg of Calcium a day and 800-1000 IU of Vitamin D a day.  You can only absorb 500 mg of Calcium at a time therefore Calcium must be taken in 2 or 3 separate doses throughout the day.  Hormones- Hormone therapy has been associated with increased risk for certain cancers and heart disease.  Talk to your healthcare provider about if you need relief from menopausal symptoms.  Aspirin- Ask your healthcare provider about taking Aspirin to prevent Heart Disease and Stroke.  Get these Immuniztions  Flu shot- Every fall  Pneumonia shot- Once after the age of 65; if you are younger ask your healthcare provider if you need a pneumonia shot.  Tetanus- Every   ten years.  Zostavax- Once after the age of 60 to prevent shingles.  Take these steps  Don't smoke- Your healthcare provider can help you quit. For tips on how to quit, ask your healthcare provider or go to www.smokefree.gov or call 1-800 QUIT-NOW.  Be physically active- Exercise 5 days a week for a minimum of 30 minutes.  If you are not already  physically active, start slow and gradually work up to 30 minutes of moderate physical activity.  Try walking, dancing, bike riding, swimming, etc.  Eat a healthy diet- Eat a variety of healthy foods such as fruits, vegetables, whole grains, low fat milk, low fat cheeses, yogurt, lean meats, chicken, fish, eggs, dried beans, tofu, etc.  For more information go to www.thenutritionsource.org  Dental visit- Brush and floss teeth twice daily; visit your dentist twice a year.  Eye exam- Visit your Optometrist or Ophthalmologist yearly.  Drink alcohol in moderation- Limit alcohol intake to one drink or less a day.  Never drink and drive.  Depression- Your emotional health is as important as your physical health.  If you're feeling down or losing interest in things you normally enjoy, please talk to your healthcare provider.  Seat Belts- can save your life; always wear one  Smoke/Carbon Monoxide detectors- These detectors need to be installed on the appropriate level of your home.  Replace batteries at least once a year.  Violence- If anyone is threatening or hurting you, please tell your healthcare provider.  Living Will/ Health care power of attorney- Discuss with your healthcare provider and family. 

## 2016-05-21 LAB — COMPREHENSIVE METABOLIC PANEL
A/G RATIO: 1.5 (ref 1.2–2.2)
ALT: 16 IU/L (ref 0–32)
AST: 22 IU/L (ref 0–40)
Albumin: 4.2 g/dL (ref 3.5–4.8)
Alkaline Phosphatase: 66 IU/L (ref 39–117)
BILIRUBIN TOTAL: 0.3 mg/dL (ref 0.0–1.2)
BUN/Creatinine Ratio: 30 — ABNORMAL HIGH (ref 12–28)
BUN: 19 mg/dL (ref 8–27)
CHLORIDE: 100 mmol/L (ref 96–106)
CO2: 25 mmol/L (ref 18–29)
Calcium: 9.7 mg/dL (ref 8.7–10.3)
Creatinine, Ser: 0.64 mg/dL (ref 0.57–1.00)
GFR calc Af Amer: 98 mL/min/{1.73_m2} (ref >59)
GFR, EST NON AFRICAN AMERICAN: 85 mL/min/{1.73_m2} (ref >59)
GLOBULIN, TOTAL: 2.8 g/dL (ref 1.5–4.5)
Glucose: 91 mg/dL (ref 65–99)
POTASSIUM: 4.8 mmol/L (ref 3.5–5.2)
SODIUM: 142 mmol/L (ref 134–144)
Total Protein: 7 g/dL (ref 6.0–8.5)

## 2016-05-21 LAB — CBC WITH DIFFERENTIAL/PLATELET
BASOS: 1 %
Basophils Absolute: 0 10*3/uL (ref 0.0–0.2)
EOS (ABSOLUTE): 0.1 10*3/uL (ref 0.0–0.4)
EOS: 1 %
HEMATOCRIT: 40.6 % (ref 34.0–46.6)
Hemoglobin: 13.4 g/dL (ref 11.1–15.9)
IMMATURE GRANULOCYTES: 0 %
Immature Grans (Abs): 0 10*3/uL (ref 0.0–0.1)
LYMPHS ABS: 1.4 10*3/uL (ref 0.7–3.1)
Lymphs: 24 %
MCH: 26.4 pg — AB (ref 26.6–33.0)
MCHC: 33 g/dL (ref 31.5–35.7)
MCV: 80 fL (ref 79–97)
MONOS ABS: 0.5 10*3/uL (ref 0.1–0.9)
Monocytes: 9 %
NEUTROS ABS: 3.8 10*3/uL (ref 1.4–7.0)
Neutrophils: 65 %
PLATELETS: 303 10*3/uL (ref 150–379)
RBC: 5.07 x10E6/uL (ref 3.77–5.28)
RDW: 14 % (ref 12.3–15.4)
WBC: 5.9 10*3/uL (ref 3.4–10.8)

## 2016-05-21 LAB — LIPID PANEL
CHOL/HDL RATIO: 2.2 ratio (ref 0.0–4.4)
Cholesterol, Total: 226 mg/dL — ABNORMAL HIGH (ref 100–199)
HDL: 102 mg/dL (ref >39)
LDL Calculated: 104 mg/dL — ABNORMAL HIGH (ref 0–99)
TRIGLYCERIDES: 102 mg/dL (ref 0–149)
VLDL Cholesterol Cal: 20 mg/dL (ref 5–40)

## 2016-05-21 LAB — T4, FREE: FREE T4: 1.07 ng/dL (ref 0.82–1.77)

## 2016-05-21 LAB — TSH: TSH: 0.045 u[IU]/mL — ABNORMAL LOW (ref 0.450–4.500)

## 2016-07-09 ENCOUNTER — Encounter: Payer: Self-pay | Admitting: *Deleted

## 2016-07-14 MED ORDER — CYCLOPENTOLATE HCL 2 % OP SOLN
1.0000 [drp] | OPHTHALMIC | Status: AC
  Administered 2016-07-15 (×4): 1 [drp] via OPHTHALMIC

## 2016-07-14 MED ORDER — MOXIFLOXACIN HCL 0.5 % OP SOLN
1.0000 [drp] | OPHTHALMIC | Status: AC
  Administered 2016-07-15 (×3): 1 [drp] via OPHTHALMIC

## 2016-07-14 MED ORDER — PHENYLEPHRINE HCL 10 % OP SOLN
1.0000 [drp] | OPHTHALMIC | Status: AC
  Administered 2016-07-15 (×4): 1 [drp] via OPHTHALMIC

## 2016-07-14 NOTE — H&P (Signed)
See scanned note.

## 2016-07-15 ENCOUNTER — Ambulatory Visit
Admission: RE | Admit: 2016-07-15 | Discharge: 2016-07-15 | Disposition: A | Discharge status: Home / Self Care | Payer: Medicare Other | Source: Ambulatory Visit | Attending: Ophthalmology | Admitting: Ophthalmology

## 2016-07-15 ENCOUNTER — Ambulatory Visit: Payer: Medicare Other | Admitting: Anesthesiology

## 2016-07-15 ENCOUNTER — Encounter: Payer: Self-pay | Admitting: *Deleted

## 2016-07-15 ENCOUNTER — Encounter
Admission: RE | Disposition: A | Discharge status: Home / Self Care | Payer: Self-pay | Source: Ambulatory Visit | Attending: Ophthalmology

## 2016-07-15 DIAGNOSIS — Z79899 Other long term (current) drug therapy: Secondary | ICD-10-CM | POA: Insufficient documentation

## 2016-07-15 DIAGNOSIS — J449 Chronic obstructive pulmonary disease, unspecified: Secondary | ICD-10-CM | POA: Insufficient documentation

## 2016-07-15 DIAGNOSIS — H2511 Age-related nuclear cataract, right eye: Secondary | ICD-10-CM | POA: Insufficient documentation

## 2016-07-15 DIAGNOSIS — I252 Old myocardial infarction: Secondary | ICD-10-CM | POA: Diagnosis not present

## 2016-07-15 DIAGNOSIS — E039 Hypothyroidism, unspecified: Secondary | ICD-10-CM | POA: Insufficient documentation

## 2016-07-15 DIAGNOSIS — Z881 Allergy status to other antibiotic agents status: Secondary | ICD-10-CM | POA: Insufficient documentation

## 2016-07-15 DIAGNOSIS — Z880 Allergy status to penicillin: Secondary | ICD-10-CM | POA: Insufficient documentation

## 2016-07-15 HISTORY — DX: Dyspnea, unspecified: R06.00

## 2016-07-15 HISTORY — DX: Other complications of anesthesia, initial encounter: T88.59XA

## 2016-07-15 HISTORY — DX: Cardiac murmur, unspecified: R01.1

## 2016-07-15 HISTORY — DX: Nausea with vomiting, unspecified: R11.2

## 2016-07-15 HISTORY — DX: Other specified postprocedural states: Z98.890

## 2016-07-15 HISTORY — DX: Nonrheumatic mitral (valve) prolapse: I34.1

## 2016-07-15 HISTORY — DX: Hypothyroidism, unspecified: E03.9

## 2016-07-15 HISTORY — PX: CATARACT EXTRACTION W/PHACO: SHX586

## 2016-07-15 HISTORY — DX: Adverse effect of unspecified anesthetic, initial encounter: T41.45XA

## 2016-07-15 SURGERY — PHACOEMULSIFICATION, CATARACT, WITH IOL INSERTION
Anesthesia: General | Site: Eye | Laterality: Right | Wound class: Clean

## 2016-07-15 MED ORDER — CARBACHOL 0.01 % IO SOLN
INTRAOCULAR | Status: DC | PRN
  Administered 2016-07-15: 0.5 mL via INTRAOCULAR

## 2016-07-15 MED ORDER — MOXIFLOXACIN HCL 0.5 % OP SOLN
OPHTHALMIC | Status: DC | PRN
  Administered 2016-07-15: 0.2 mL via OPHTHALMIC

## 2016-07-15 MED ORDER — CYCLOPENTOLATE HCL 2 % OP SOLN
OPHTHALMIC | Status: AC
  Administered 2016-07-15: 1 [drp] via OPHTHALMIC
  Filled 2016-07-15: qty 2

## 2016-07-15 MED ORDER — NA CHONDROIT SULF-NA HYALURON 40-17 MG/ML IO SOLN
INTRAOCULAR | Status: AC
  Filled 2016-07-15: qty 1

## 2016-07-15 MED ORDER — EPINEPHRINE PF 1 MG/ML IJ SOLN
INTRAMUSCULAR | Status: AC
  Filled 2016-07-15: qty 2

## 2016-07-15 MED ORDER — ONDANSETRON HCL 4 MG/2ML IJ SOLN: 4.0000 mg | Freq: Once | INTRAMUSCULAR | Status: DC | PRN

## 2016-07-15 MED ORDER — TETRACAINE HCL 0.5 % OP SOLN
OPHTHALMIC | Status: DC | PRN
  Administered 2016-07-15: 1 [drp] via OPHTHALMIC

## 2016-07-15 MED ORDER — EPINEPHRINE PF 1 MG/ML IJ SOLN
INTRAMUSCULAR | Status: DC | PRN
  Administered 2016-07-15: 09:00:00 via OPHTHALMIC

## 2016-07-15 MED ORDER — LIDOCAINE HCL (PF) 2 % IJ SOLN
INTRAMUSCULAR | Status: AC
  Filled 2016-07-15: qty 2

## 2016-07-15 MED ORDER — MOXIFLOXACIN HCL 0.5 % OP SOLN
OPHTHALMIC | Status: AC
  Administered 2016-07-15: 1 [drp] via OPHTHALMIC
  Filled 2016-07-15: qty 3

## 2016-07-15 MED ORDER — TETRACAINE HCL 0.5 % OP SOLN
OPHTHALMIC | Status: AC
  Filled 2016-07-15: qty 2

## 2016-07-15 MED ORDER — POVIDONE-IODINE 5 % OP SOLN
OPHTHALMIC | Status: DC | PRN
  Administered 2016-07-15: 1 via OPHTHALMIC

## 2016-07-15 MED ORDER — ONDANSETRON HCL 4 MG/2ML IJ SOLN
INTRAMUSCULAR | Status: DC | PRN
  Administered 2016-07-15: 4 mg via INTRAVENOUS

## 2016-07-15 MED ORDER — BUPIVACAINE HCL (PF) 0.75 % IJ SOLN
INTRAMUSCULAR | Status: AC
  Filled 2016-07-15: qty 10

## 2016-07-15 MED ORDER — LIDOCAINE HCL (PF) 4 % IJ SOLN
INTRAOCULAR | Status: DC | PRN
  Administered 2016-07-15: 4 mL via OPHTHALMIC

## 2016-07-15 MED ORDER — CEFUROXIME OPHTHALMIC INJECTION 1 MG/0.1 ML
INJECTION | OPHTHALMIC | Status: AC
  Filled 2016-07-15: qty 0.1

## 2016-07-15 MED ORDER — SODIUM CHLORIDE 0.9 % IV SOLN
INTRAVENOUS | Status: DC
  Administered 2016-07-15 (×2): via INTRAVENOUS

## 2016-07-15 MED ORDER — LIDOCAINE HCL (PF) 4 % IJ SOLN
INTRAMUSCULAR | Status: DC | PRN
  Administered 2016-07-15: 9 mL via OPHTHALMIC

## 2016-07-15 MED ORDER — MOXIFLOXACIN HCL 0.5 % OP SOLN
OPHTHALMIC | Status: AC
  Filled 2016-07-15: qty 3

## 2016-07-15 MED ORDER — POVIDONE-IODINE 5 % OP SOLN
OPHTHALMIC | Status: AC
  Filled 2016-07-15: qty 30

## 2016-07-15 MED ORDER — ALFENTANIL 500 MCG/ML IJ INJ
INJECTION | INTRAMUSCULAR | Status: DC | PRN
  Administered 2016-07-15: 250 ug via INTRAVENOUS
  Administered 2016-07-15: 500 ug via INTRAVENOUS

## 2016-07-15 MED ORDER — NA CHONDROIT SULF-NA HYALURON 40-17 MG/ML IO SOLN
INTRAOCULAR | Status: DC | PRN
  Administered 2016-07-15: 1 mL via INTRAOCULAR

## 2016-07-15 MED ORDER — FENTANYL CITRATE (PF) 100 MCG/2ML IJ SOLN: 25.0000 ug | INTRAMUSCULAR | Status: DC | PRN

## 2016-07-15 MED ORDER — DEXAMETHASONE SODIUM PHOSPHATE 10 MG/ML IJ SOLN
INTRAMUSCULAR | Status: DC | PRN
Start: 2016-07-15 — End: 2016-07-15
  Administered 2016-07-15: 8 mg via INTRAVENOUS

## 2016-07-15 MED ORDER — ONDANSETRON HCL 4 MG/2ML IJ SOLN
INTRAMUSCULAR | Status: AC
  Filled 2016-07-15: qty 2

## 2016-07-15 MED ORDER — PHENYLEPHRINE HCL 10 % OP SOLN
OPHTHALMIC | Status: AC
  Administered 2016-07-15: 1 [drp] via OPHTHALMIC
  Filled 2016-07-15: qty 5

## 2016-07-15 MED ORDER — HYALURONIDASE HUMAN 150 UNIT/ML IJ SOLN
INTRAMUSCULAR | Status: AC
  Filled 2016-07-15: qty 1

## 2016-07-15 SURGICAL SUPPLY — 31 items
CANNULA ANT/CHMB 27G (MISCELLANEOUS) ×1 IMPLANT
CANNULA ANT/CHMB 27GA (MISCELLANEOUS) ×3 IMPLANT
CORD BIP STRL DISP 12FT (MISCELLANEOUS) ×3 IMPLANT
CUP MEDICINE 2OZ PLAST GRAD ST (MISCELLANEOUS) ×3 IMPLANT
DRAPE XRAY CASSETTE 23X24 (DRAPES) ×3 IMPLANT
ERASER HMR WETFIELD 18G (MISCELLANEOUS) ×3 IMPLANT
GLOVE BIO SURGEON STRL SZ8 (GLOVE) ×3 IMPLANT
GLOVE SURG LX 6.5 MICRO (GLOVE) ×2
GLOVE SURG LX 8.0 MICRO (GLOVE) ×2
GLOVE SURG LX STRL 6.5 MICRO (GLOVE) ×1 IMPLANT
GLOVE SURG LX STRL 8.0 MICRO (GLOVE) ×1 IMPLANT
GOWN STRL REUS W/ TWL LRG LVL3 (GOWN DISPOSABLE) ×1 IMPLANT
GOWN STRL REUS W/ TWL XL LVL3 (GOWN DISPOSABLE) ×1 IMPLANT
GOWN STRL REUS W/TWL LRG LVL3 (GOWN DISPOSABLE) ×3
GOWN STRL REUS W/TWL XL LVL3 (GOWN DISPOSABLE) ×3
LENS IOL ACRSF IQ ULTRA 23.5 (Intraocular Lens) IMPLANT
LENS IOL ACRYSOF IQ 23.5 (Intraocular Lens) ×3 IMPLANT
PACK CATARACT (MISCELLANEOUS) ×3 IMPLANT
PACK CATARACT DINGLEDEIN LX (MISCELLANEOUS) ×3 IMPLANT
PACK EYE AFTER SURG (MISCELLANEOUS) ×3 IMPLANT
SHLD EYE VISITEC  UNIV (MISCELLANEOUS) ×3 IMPLANT
SOL BSS BAG (MISCELLANEOUS) ×3
SOL PREP PVP 2OZ (MISCELLANEOUS) ×3
SOLUTION BSS BAG (MISCELLANEOUS) ×1 IMPLANT
SOLUTION PREP PVP 2OZ (MISCELLANEOUS) ×1 IMPLANT
SUT SILK 5-0 (SUTURE) ×3 IMPLANT
SYR 3ML LL SCALE MARK (SYRINGE) ×3 IMPLANT
SYR 5ML LL (SYRINGE) ×3 IMPLANT
SYR TB 1ML 27GX1/2 LL (SYRINGE) ×3 IMPLANT
WATER STERILE IRR 250ML POUR (IV SOLUTION) ×3 IMPLANT
WIPE NON LINTING 3.25X3.25 (MISCELLANEOUS) ×3 IMPLANT

## 2016-07-15 NOTE — Discharge Instructions (Addendum)
Eye Surgery Discharge Instructions  Expect mild scratchy sensation or mild soreness. DO NOT RUB YOUR EYE!  The day of surgery:  Minimal physical activity, but bed rest is not required  No reading, computer work, or close hand work  No bending, lifting, or straining.  May watch TV  For 24 hours:  No driving, legal decisions, or alcoholic beverages  Safety precautions  Eat anything you prefer: It is better to start with liquids, then soup then solid foods.  Eye patch should be worn until postoperative exam tomorrow.  Resume all regular medications including aspirin or Coumadin if these were discontinued prior to surgery. You may shower, bathe, shave, or wash your hair. Tylenol may be taken for mild discomfort.  Call your doctor if you experience significant pain, nausea, or vomiting, fever > 101 or other signs of infection. [phone] or [phone] Specific instructions:  Follow-up Information    Alizandra Loh, MD Follow up on 07/16/2016.   Specialty:  Ophthalmology Why:  10:25 am Contact information: [address]   [place] [address] [phone]

## 2016-07-15 NOTE — Anesthesia Preprocedure Evaluation (Signed)
Anesthesia Evaluation  Patient identified by MRN, date of birth, ID band Patient awake    Reviewed: Allergy & Precautions, NPO status , Patient's Chart, lab work & pertinent test results  History of Anesthesia Complications (+) PONV and history of anesthetic complications  Airway Mallampati: II       Dental   Pulmonary shortness of breath and with exertion, asthma , COPD,  COPD inhaler,    Pulmonary exam normal        Cardiovascular + Past MI  Normal cardiovascular exam+ Valvular Problems/Murmurs      Neuro/Psych negative neurological ROS  negative psych ROS   GI/Hepatic Neg liver ROS, GERD  Medicated,  Endo/Other  Hypothyroidism   Renal/GU negative Renal ROS  negative genitourinary   Musculoskeletal negative musculoskeletal ROS (+)   Abdominal Normal abdominal exam  (+)   Peds negative pediatric ROS (+)  Hematology negative hematology ROS (+)   Anesthesia Other Findings   Reproductive/Obstetrics                             Anesthesia Physical Anesthesia Plan  ASA: III  Anesthesia Plan: General   Post-op Pain Management:    Induction: Intravenous  Airway Management Planned: Nasal Cannula  Additional Equipment:   Intra-op Plan:   Post-operative Plan:   Informed Consent: I have reviewed the patients History and Physical, chart, labs and discussed the procedure including the risks, benefits and alternatives for the proposed anesthesia with the patient or authorized representative who has indicated his/her understanding and acceptance.   Dental advisory given  Plan Discussed with: CRNA and Surgeon  Anesthesia Plan Comments:         Anesthesia Quick Evaluation

## 2016-07-15 NOTE — Progress Notes (Signed)
IV removed from left wrist, catheter intact; pressure dressing applied.

## 2016-07-15 NOTE — Transfer of Care (Signed)
Immediate Anesthesia Transfer of Care Note  Patient: Shelia Fernandez  Procedure(s) Performed: Procedure(s) with comments: CATARACT EXTRACTION PHACO AND INTRAOCULAR LENS PLACEMENT (IOC) (Right) - [place] 3:03.8 AP% 25.8 CDE 92.59 FLUID PACK LOT # [id number]  Patient Location: PACU  Anesthesia Type:MAC  Level of Consciousness: awake  Airway & Oxygen Therapy: Patient Spontanous Breathing and Patient connected to nasal cannula oxygen  Post-op Assessment: Report given to RN and Post -op Vital signs reviewed and stable  Post vital signs: Reviewed and stable  Last Vitals:  Vitals:   07/15/16 0651 07/15/16 0936  BP: (!) 120/50 (!) 125/50  Pulse: 81 78  Resp: 18 14  Temp: 36.2 C 36.4 C    Last Pain:  Vitals:   07/15/16 0936  TempSrc: Temporal         Complications: No apparent anesthesia complications

## 2016-07-15 NOTE — Op Note (Signed)
Date of Surgery: 07/15/2016 Date of Dictation: 07/15/2016 9:33 AM Pre-operative Diagnosis:  Nuclear Sclerotic Cataract right Eye Post-operative Diagnosis: same Procedure performed: Extra-capsular Cataract Extraction (ECCE) with placement of a posterior chamber intraocular lens (IOL) right Eye IOL:  Implant Name Type Inv. Item Serial No. Manufacturer Lot No. LRB No. Used  LENS IOL ACRYSOF IQ 23.5 - [id number] Intraocular Lens LENS IOL ACRYSOF IQ 23.5 [id number] ALCON   Right 1   Anesthesia: 2% Lidocaine and 4% Marcaine in a 50/50 mixture with 10 unites/ml of Hylenex given as a peribulbar Anesthesiologist: Anesthesiologist: Alvin Critchley, MD CRNA: Allean Found, CRNA; Nelda Marseille, CRNA Complications: none Estimated Blood Loss: less than 1 ml  Description of procedure:  The patient was given anesthesia and sedation via intravenous access. The patient was then prepped and draped in the usual fashion. A 25-gauge needle was bent for initiating the capsulorhexis. A 5-0 silk suture was placed through the conjunctiva superior and inferiorly to serve as bridle sutures. Hemostasis was obtained at the superior limbus using an eraser cautery. A partial thickness groove was made at the anterior surgical limbus with a 64 Beaver blade and this was dissected anteriorly with an Avaya. The anterior chamber was entered at 10 o'clock with a 1.0 mm paracentesis knife and through the lamellar dissection with a 2.6 mm Alcon keratome. Epi-Shugarcaine 0.5 CC [9 cc BSS Plus (Alcon), 3 cc 4% preservative-free lidocaine (Hospira) and 4 cc 1:1000 preservative-free, bisulfite-free epinephrine] was injected into the anterior chamber via the paracentesis tract. Epi-Shugarcaine 0.5 CC [9 cc BSS Plus (Alcon), 3 cc 4% preservative-free lidocaine (Hospira) and 4 cc 1:1000 preservative-free, bisulfite-free epinephrine] was injected into the anterior chamber via the paracentesis tract. DiscoVisc was injected to replace  the aqueous and a continuous tear curvilinear capsulorhexis was performed using a bent 25-gauge needle.  Balance salt on a syringe was used to perform hydro-dissection and phacoemulsification was carried out using a divide and conquer technique. Procedure(s) with comments: CATARACT EXTRACTION PHACO AND INTRAOCULAR LENS PLACEMENT (IOC) (Right) - [place] 3:03.8 AP% 25.8 CDE 92.59 FLUID PACK LOT # [id number]. Irrigation/aspiration was used to remove the residual cortex and the capsular bag was inflated with DiscoVisc. The intraocular lens was inserted into the capsular bag using a pre-loaded UltraSert Delivery System. Irrigation/aspiration was used to remove the residual DiscoVisc. The wound was inflated with balanced salt and checked for leaks. None were found. Miostat was injected via the paracentesis track and 0.1 ml of Vigamox containing 1 mg of drug  was injected via the paracentesis track. The wound was checked for leaks again and none were found.   The bridal sutures were removed and two drops of Vigamox were placed on the eye. An eye shield was placed to protect the eye and the patient was discharged to the recovery area in good condition.   Chozen Latulippe MD

## 2016-07-15 NOTE — Interval H&P Note (Signed)
History and Physical Interval Note:  07/15/2016 7:24 AM  [name]  has presented today for surgery, with the diagnosis of NUCLEAR SCLEROTIC CATARACT RIGHT EYE  The various methods of treatment have been discussed with the patient and family. After consideration of risks, benefits and other options for treatment, the patient has consented to  Procedure(s) with comments: CATARACT EXTRACTION PHACO AND INTRAOCULAR LENS PLACEMENT (IOC) (Right) - [place] AP% CDE FLUID PACK LOT # [id number] as a surgical intervention .  The patient's history has been reviewed, patient examined, no change in status, stable for surgery.  I have reviewed the patient's chart and labs.  Questions were answered to the patient's satisfaction.     Shelia Fernandez

## 2016-07-15 NOTE — Anesthesia Post-op Follow-up Note (Cosign Needed)
Anesthesia QCDR form completed.        

## 2016-07-15 NOTE — Anesthesia Postprocedure Evaluation (Signed)
Anesthesia Post Note  Patient: Shelia Fernandez  Procedure(s) Performed: Procedure(s) (LRB): CATARACT EXTRACTION PHACO AND INTRAOCULAR LENS PLACEMENT (IOC) (Right)  Patient location during evaluation: PACU Anesthesia Type: MAC Level of consciousness: awake Pain management: pain level controlled Vital Signs Assessment: post-procedure vital signs reviewed and stable Respiratory status: spontaneous breathing Cardiovascular status: blood pressure returned to baseline and stable Postop Assessment: no headache Anesthetic complications: no     Last Vitals:  Vitals:   07/15/16 0651 07/15/16 0936  BP: (!) 120/50 (!) 125/50  Pulse: 81 78  Resp: 18 14  Temp: 36.2 C 36.4 C    Last Pain:  Vitals:   07/15/16 0936  TempSrc: Temporal                 Buckner Malta

## 2016-07-15 NOTE — Anesthesia Procedure Notes (Signed)
Date/Time: 07/15/2016 8:55 AM Performed by: Allean Found Pre-anesthesia Checklist: Patient identified, Emergency Drugs available, Suction available, Patient being monitored and Timeout performed Oxygen Delivery Method: Nasal cannula

## 2016-09-22 ENCOUNTER — Other Ambulatory Visit: Payer: Self-pay | Admitting: *Deleted

## 2016-09-22 ENCOUNTER — Inpatient Hospital Stay
Admission: RE | Admit: 2016-09-22 | Discharge: 2016-09-22 | Disposition: A | Discharge status: Home / Self Care | Payer: Self-pay | Source: Ambulatory Visit | Attending: *Deleted | Admitting: *Deleted

## 2016-09-22 ENCOUNTER — Ambulatory Visit
Admission: RE | Admit: 2016-09-22 | Discharge: 2016-09-22 | Disposition: A | Discharge status: Home / Self Care | Payer: Medicare Other | Source: Ambulatory Visit | Attending: Family Medicine | Admitting: Family Medicine

## 2016-09-22 DIAGNOSIS — Z9289 Personal history of other medical treatment: Secondary | ICD-10-CM

## 2016-09-22 DIAGNOSIS — Z1231 Encounter for screening mammogram for malignant neoplasm of breast: Secondary | ICD-10-CM | POA: Diagnosis not present

## 2016-10-06 ENCOUNTER — Encounter: Payer: Self-pay | Admitting: *Deleted

## 2016-10-12 NOTE — H&P (Signed)
See scanned note.

## 2016-10-14 ENCOUNTER — Ambulatory Visit
Admission: RE | Admit: 2016-10-14 | Discharge: 2016-10-14 | Disposition: A | Discharge status: Home / Self Care | Payer: Medicare Other | Source: Ambulatory Visit | Attending: Ophthalmology | Admitting: Ophthalmology

## 2016-10-14 ENCOUNTER — Encounter: Payer: Self-pay | Admitting: *Deleted

## 2016-10-14 ENCOUNTER — Encounter
Admission: RE | Disposition: A | Discharge status: Home / Self Care | Payer: Self-pay | Source: Ambulatory Visit | Attending: Ophthalmology

## 2016-10-14 ENCOUNTER — Ambulatory Visit: Payer: Medicare Other | Admitting: Certified Registered"

## 2016-10-14 DIAGNOSIS — J449 Chronic obstructive pulmonary disease, unspecified: Secondary | ICD-10-CM | POA: Insufficient documentation

## 2016-10-14 DIAGNOSIS — Q453 Other congenital malformations of pancreas and pancreatic duct: Secondary | ICD-10-CM | POA: Diagnosis not present

## 2016-10-14 DIAGNOSIS — Z9841 Cataract extraction status, right eye: Secondary | ICD-10-CM | POA: Insufficient documentation

## 2016-10-14 DIAGNOSIS — Z880 Allergy status to penicillin: Secondary | ICD-10-CM | POA: Diagnosis not present

## 2016-10-14 DIAGNOSIS — I341 Nonrheumatic mitral (valve) prolapse: Secondary | ICD-10-CM | POA: Diagnosis not present

## 2016-10-14 DIAGNOSIS — Z881 Allergy status to other antibiotic agents status: Secondary | ICD-10-CM | POA: Insufficient documentation

## 2016-10-14 DIAGNOSIS — H2512 Age-related nuclear cataract, left eye: Secondary | ICD-10-CM | POA: Diagnosis present

## 2016-10-14 DIAGNOSIS — E039 Hypothyroidism, unspecified: Secondary | ICD-10-CM | POA: Insufficient documentation

## 2016-10-14 DIAGNOSIS — Z888 Allergy status to other drugs, medicaments and biological substances status: Secondary | ICD-10-CM | POA: Insufficient documentation

## 2016-10-14 DIAGNOSIS — I252 Old myocardial infarction: Secondary | ICD-10-CM | POA: Insufficient documentation

## 2016-10-14 DIAGNOSIS — Z7951 Long term (current) use of inhaled steroids: Secondary | ICD-10-CM | POA: Diagnosis not present

## 2016-10-14 HISTORY — PX: CATARACT EXTRACTION W/PHACO: SHX586

## 2016-10-14 SURGERY — PHACOEMULSIFICATION, CATARACT, WITH IOL INSERTION
Anesthesia: Monitor Anesthesia Care | Site: Eye | Laterality: Left | Wound class: Clean

## 2016-10-14 MED ORDER — LIDOCAINE HCL (PF) 4 % IJ SOLN
INTRAMUSCULAR | Status: DC | PRN
  Administered 2016-10-14: 08:00:00 via OPHTHALMIC

## 2016-10-14 MED ORDER — TETRACAINE HCL 0.5 % OP SOLN
OPHTHALMIC | Status: DC | PRN
  Administered 2016-10-14: 2 [drp] via OPHTHALMIC

## 2016-10-14 MED ORDER — CYCLOPENTOLATE HCL 2 % OP SOLN
1.0000 [drp] | OPHTHALMIC | Status: AC
  Administered 2016-10-14 (×4): 1 [drp] via OPHTHALMIC

## 2016-10-14 MED ORDER — MOXIFLOXACIN HCL 0.5 % OP SOLN
OPHTHALMIC | Status: AC
  Administered 2016-10-14: 1 [drp] via OPHTHALMIC
  Filled 2016-10-14: qty 3

## 2016-10-14 MED ORDER — HYALURONIDASE HUMAN 150 UNIT/ML IJ SOLN
INTRAMUSCULAR | Status: AC
  Filled 2016-10-14: qty 1

## 2016-10-14 MED ORDER — MOXIFLOXACIN HCL 0.5 % OP SOLN
OPHTHALMIC | Status: DC | PRN
  Administered 2016-10-14: 0.2 mL via OPHTHALMIC

## 2016-10-14 MED ORDER — EPINEPHRINE PF 1 MG/ML IJ SOLN
INTRAMUSCULAR | Status: AC
  Filled 2016-10-14: qty 1

## 2016-10-14 MED ORDER — IPRATROPIUM-ALBUTEROL 0.5-2.5 (3) MG/3ML IN SOLN
RESPIRATORY_TRACT | Status: AC
  Administered 2016-10-14: 3 mL via RESPIRATORY_TRACT
  Filled 2016-10-14: qty 3

## 2016-10-14 MED ORDER — IPRATROPIUM-ALBUTEROL 0.5-2.5 (3) MG/3ML IN SOLN
3.0000 mL | Freq: Once | RESPIRATORY_TRACT | Status: AC
  Administered 2016-10-14: 3 mL via RESPIRATORY_TRACT

## 2016-10-14 MED ORDER — PHENYLEPHRINE HCL 10 % OP SOLN
1.0000 [drp] | OPHTHALMIC | Status: DC
  Administered 2016-10-14 (×4): 1 [drp] via OPHTHALMIC

## 2016-10-14 MED ORDER — LIDOCAINE HCL (PF) 4 % IJ SOLN
INTRAMUSCULAR | Status: AC
Start: 2016-10-14 — End: ?
  Filled 2016-10-14: qty 5

## 2016-10-14 MED ORDER — POVIDONE-IODINE 5 % OP SOLN
OPHTHALMIC | Status: DC | PRN
  Administered 2016-10-14: 1 via OPHTHALMIC

## 2016-10-14 MED ORDER — BSS IO SOLN
INTRAOCULAR | Status: DC | PRN
  Administered 2016-10-14: 08:00:00 via OPHTHALMIC

## 2016-10-14 MED ORDER — ALFENTANIL 500 MCG/ML IJ INJ
INJECTION | INTRAVENOUS | Status: DC | PRN
  Administered 2016-10-14: 500 ug via INTRAVENOUS
  Administered 2016-10-14: 250 ug via INTRAVENOUS

## 2016-10-14 MED ORDER — PHENYLEPHRINE HCL 10 % OP SOLN
OPHTHALMIC | Status: AC
Start: 2016-10-14 — End: 2016-10-14
  Administered 2016-10-14: 1 [drp] via OPHTHALMIC
  Filled 2016-10-14: qty 5

## 2016-10-14 MED ORDER — LIDOCAINE HCL (PF) 4 % IJ SOLN
INTRAOCULAR | Status: DC | PRN
  Administered 2016-10-14: 4 mL via OPHTHALMIC

## 2016-10-14 MED ORDER — SODIUM CHLORIDE 0.9 % IV SOLN
INTRAVENOUS | Status: DC
  Administered 2016-10-14: 50 mL/h via INTRAVENOUS

## 2016-10-14 MED ORDER — BUPIVACAINE HCL (PF) 0.75 % IJ SOLN
INTRAMUSCULAR | Status: AC
  Filled 2016-10-14: qty 10

## 2016-10-14 MED ORDER — NA CHONDROIT SULF-NA HYALURON 40-17 MG/ML IO SOLN
INTRAOCULAR | Status: DC | PRN
  Administered 2016-10-14: 1 mL via INTRAOCULAR

## 2016-10-14 MED ORDER — MOXIFLOXACIN HCL 0.5 % OP SOLN
1.0000 [drp] | OPHTHALMIC | Status: AC
  Administered 2016-10-14 (×3): 1 [drp] via OPHTHALMIC

## 2016-10-14 MED ORDER — CYCLOPENTOLATE HCL 2 % OP SOLN
OPHTHALMIC | Status: AC
  Administered 2016-10-14: 1 [drp] via OPHTHALMIC
  Filled 2016-10-14: qty 2

## 2016-10-14 MED ORDER — CARBACHOL 0.01 % IO SOLN
INTRAOCULAR | Status: DC | PRN
  Administered 2016-10-14: 0.5 mL via INTRAOCULAR

## 2016-10-14 SURGICAL SUPPLY — 25 items
CORD BIP STRL DISP 12FT (MISCELLANEOUS) ×3 IMPLANT
DRAPE XRAY CASSETTE 23X24 (DRAPES) ×3 IMPLANT
ERASER HMR WETFIELD 18G (MISCELLANEOUS) ×3 IMPLANT
GLOVE BIO SURGEON STRL SZ8 (GLOVE) ×3 IMPLANT
GLOVE SURG LX 6.5 MICRO (GLOVE) ×2
GLOVE SURG LX 8.0 MICRO (GLOVE) ×2
GLOVE SURG LX STRL 6.5 MICRO (GLOVE) ×1 IMPLANT
GLOVE SURG LX STRL 8.0 MICRO (GLOVE) ×1 IMPLANT
GOWN STRL REUS W/ TWL LRG LVL3 (GOWN DISPOSABLE) ×1 IMPLANT
GOWN STRL REUS W/ TWL XL LVL3 (GOWN DISPOSABLE) ×1 IMPLANT
GOWN STRL REUS W/TWL LRG LVL3 (GOWN DISPOSABLE) ×3
GOWN STRL REUS W/TWL XL LVL3 (GOWN DISPOSABLE) ×3
LABEL CATARACT MEDS ST (LABEL) ×3 IMPLANT
LENS IOL ACRSF IQ ULTRA 23.5 (Intraocular Lens) IMPLANT
LENS IOL ACRYSOF IQ 23.5 (Intraocular Lens) ×3 IMPLANT
PACK CATARACT (MISCELLANEOUS) ×3 IMPLANT
PACK CATARACT DINGLEDEIN LX (MISCELLANEOUS) ×3 IMPLANT
PACK EYE AFTER SURG (MISCELLANEOUS) ×3 IMPLANT
SHLD EYE VISITEC  UNIV (MISCELLANEOUS) ×3 IMPLANT
SOL BSS BAG (MISCELLANEOUS) ×3
SOLUTION BSS BAG (MISCELLANEOUS) ×1 IMPLANT
SUT SILK 5-0 (SUTURE) ×3 IMPLANT
SYR 5ML LL (SYRINGE) ×3 IMPLANT
WATER STERILE IRR 250ML POUR (IV SOLUTION) ×3 IMPLANT
WIPE NON LINTING 3.25X3.25 (MISCELLANEOUS) ×3 IMPLANT

## 2016-10-14 NOTE — Transfer of Care (Signed)
Immediate Anesthesia Transfer of Care Note  Patient: Shelia Fernandez  Procedure(s) Performed: Procedure(s) with comments: CATARACT EXTRACTION PHACO AND INTRAOCULAR LENS PLACEMENT (IOC) (Left) - [place] 01:34.5 AP% 26.6 CDE 43.55 Fluid pack lot # [id number]  Patient Location: PACU  Anesthesia Type:MAC  Level of Consciousness: awake, alert  and oriented  Airway & Oxygen Therapy: Patient Spontanous Breathing  Post-op Assessment: Report given to RN and Post -op Vital signs reviewed and stable  Post vital signs: Reviewed and stable  Last Vitals:  Vitals:   10/14/16 0610 10/14/16 0833  BP: (!) 117/46 (!) 131/49  Pulse: 78   Resp: 15 13  Temp: 36.6 C     Last Pain:  Vitals:   10/14/16 0610  TempSrc: Oral      Patients Stated Pain Goal: 0 (41/93/79 0240)  Complications: No apparent anesthesia complications

## 2016-10-14 NOTE — Interval H&P Note (Signed)
History and Physical Interval Note:  10/14/2016 7:27 AM  Shelia Fernandez  has presented today for surgery, with the diagnosis of NUCLEAR SCLEROTIC CATARACT LEFT EYE  The various methods of treatment have been discussed with the patient and family. After consideration of risks, benefits and other options for treatment, the patient has consented to  Procedure(s) with comments: CATARACT EXTRACTION PHACO AND INTRAOCULAR LENS PLACEMENT (IOC) (Left) - [place] AP% CDE Fluid pack lot # [id number] as a surgical intervention .  The patient's history has been reviewed, patient examined, no change in status, stable for surgery.  I have reviewed the patient's chart and labs.  Questions were answered to the patient's satisfaction.     Briena Swingler

## 2016-10-14 NOTE — Anesthesia Post-op Follow-up Note (Cosign Needed)
Anesthesia QCDR form completed.        

## 2016-10-14 NOTE — Anesthesia Preprocedure Evaluation (Signed)
Anesthesia Evaluation  Patient identified by MRN, date of birth, ID band Patient awake    Reviewed: Allergy & Precautions, NPO status , Patient's Chart, lab work & pertinent test results  History of Anesthesia Complications (+) PONV and history of anesthetic complications  Airway Mallampati: II       Dental   Pulmonary shortness of breath and with exertion, asthma , COPD,  COPD inhaler,    Pulmonary exam normal        Cardiovascular + Past MI  Normal cardiovascular exam+ Valvular Problems/Murmurs      Neuro/Psych negative neurological ROS  negative psych ROS   GI/Hepatic Neg liver ROS, GERD  Medicated,  Endo/Other  Hypothyroidism   Renal/GU negative Renal ROS  negative genitourinary   Musculoskeletal negative musculoskeletal ROS (+)   Abdominal Normal abdominal exam  (+)   Peds negative pediatric ROS (+)  Hematology negative hematology ROS (+)   Anesthesia Other Findings Past Medical History: No date: Asthma No date: Benign positional vertigo     Comment: s/p ENT consultation with [facility] 2014.              [place]aught. 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  Intolerance               to bisphosphonates; GI upset.  Took               bisphosphonates x 10 years; last               bisphosphonates 2000.  s/p L wrist fracture               2001. No  date: Pancreas anomaly, congenital No date: PONV (postoperative nausea and vomiting) No date: Thyroid disease     Comment: Hypothyroidism   Reproductive/Obstetrics                             Anesthesia Physical  Anesthesia Plan  ASA: III  Anesthesia Plan: MAC   Post-op Pain Management:    Induction: Intravenous  PONV Risk Score and Plan: 3 and Ondansetron  Airway Management Planned: Nasal Cannula  Additional Equipment:   Intra-op Plan:   Post-operative Plan:   Informed Consent: I have reviewed the patients History and Physical, chart, labs and discussed the procedure including the risks, benefits and alternatives for the proposed anesthesia with the patient or authorized representative who has indicated his/her understanding and acceptance.   Dental advisory given  Plan Discussed with: CRNA and Surgeon  Anesthesia Plan Comments:         Anesthesia Quick Evaluation

## 2016-10-14 NOTE — Anesthesia Procedure Notes (Signed)
Performed by: Lance Muss Pre-anesthesia Checklist: Patient identified, Emergency Drugs available, Suction available, Patient being monitored and Timeout performed Oxygen Delivery Method: Nasal cannula

## 2016-10-14 NOTE — Anesthesia Postprocedure Evaluation (Signed)
Anesthesia Post Note  Patient: Shelia Fernandez  Procedure(s) Performed: Procedure(s) (LRB): CATARACT EXTRACTION PHACO AND INTRAOCULAR LENS PLACEMENT (IOC) (Left)  Patient location during evaluation: PACU Anesthesia Type: MAC Level of consciousness: awake, awake and alert and oriented Pain management: pain level controlled Vital Signs Assessment: post-procedure vital signs reviewed and stable Respiratory status: spontaneous breathing and nonlabored ventilation Cardiovascular status: stable     Last Vitals:  Vitals:   10/14/16 0610 10/14/16 0833  BP: (!) 117/46 (!) 131/49  Pulse: 78   Resp: 15 13  Temp: 36.6 C     Last Pain:  Vitals:   10/14/16 0610  TempSrc: Oral                 [employer]

## 2016-10-14 NOTE — Discharge Instructions (Signed)
Eye Surgery Discharge Instructions  Expect mild scratchy sensation or mild soreness. DO NOT RUB YOUR EYE!  The day of surgery:  Minimal physical activity, but bed rest is not required  No reading, computer work, or close hand work  No bending, lifting, or straining.  May watch TV  For 24 hours:  No driving, legal decisions, or alcoholic beverages  Safety precautions  Eat anything you prefer: It is better to start with liquids, then soup then solid foods.  _____ Eye patch should be worn until postoperative exam tomorrow.  ____ Solar shield eyeglasses should be worn for comfort in the sunlight/patch while sleeping  Resume all regular medications including aspirin or Coumadin if these were discontinued prior to surgery. You may shower, bathe, shave, or wash your hair. Tylenol may be taken for mild discomfort.  Call your doctor if you experience significant pain, nausea, or vomiting, fever > 101 or other signs of infection. [phone] or [phone] Specific instructions:  Follow-up Information    Leonid Manus, Remo Lipps, MD Follow up.   Specialty:  Ophthalmology Why:  July 12 at 9:45am Contact information: [address]   [place] [address] [phone]

## 2016-10-14 NOTE — Op Note (Signed)
Date of Surgery: 10/14/2016 Date of Dictation: 10/14/2016 9:19 AM Pre-operative Diagnosis:  Nuclear Sclerotic Cataract left Eye Post-operative Diagnosis: same Procedure performed: Extra-capsular Cataract Extraction (ECCE) with placement of a posterior chamber intraocular lens (IOL) left Eye IOL:  Implant Name Type Inv. Item Serial No. Manufacturer Lot No. LRB No. Used  LENS IOL ACRYSOF IQ 23.5 - [id number] Intraocular Lens LENS IOL ACRYSOF IQ 23.5 [id number] ALCON   Left 1   Anesthesia: 2% Lidocaine and 4% Marcaine in a 50/50 mixture with 10 unites/ml of Hylenex given as a peribulbar Anesthesiologist: Anesthesiologist: Martha Clan, MD CRNA: Lance Muss, CRNA Complications: none Estimated Blood Loss: less than 1 ml  Description of procedure:  The patient was given anesthesia and sedation via intravenous access. The patient was then prepped and draped in the usual fashion. A 25-gauge needle was bent for initiating the capsulorhexis. A 5-0 silk suture was placed through the conjunctiva superior and inferiorly to serve as bridle sutures. Hemostasis was obtained at the superior limbus using an eraser cautery. A partial thickness groove was made at the anterior surgical limbus with a 64 Beaver blade and this was dissected anteriorly with an Avaya. The anterior chamber was entered at 10 o'clock with a 1.0 mm paracentesis knife and through the lamellar dissection with a 2.6 mm Alcon keratome. Epi-Shugarcaine 0.5 CC [9 cc BSS Plus (Alcon), 3 cc 4% preservative-free lidocaine (Hospira) and 4 cc 1:1000 preservative-free, bisulfite-free epinephrine] was injected into the anterior chamber via the paracentesis tract. Epi-Shugarcaine 0.5 CC [9 cc BSS Plus (Alcon), 3 cc 4% preservative-free lidocaine (Hospira) and 4 cc 1:1000 preservative-free, bisulfite-free epinephrine] was injected into the anterior chamber via the paracentesis tract. DiscoVisc was injected to replace the aqueous and a  continuous tear curvilinear capsulorhexis was performed using a bent 25-gauge needle.  Balance salt on a syringe was used to perform hydro-dissection and phacoemulsification was carried out using a divide and conquer technique. Procedure(s) with comments: CATARACT EXTRACTION PHACO AND INTRAOCULAR LENS PLACEMENT (IOC) (Left) - [place] 01:34.5 AP% 26.6 CDE 43.55 Fluid pack lot # [id number]. Irrigation/aspiration was used to remove the residual cortex and the capsular bag was inflated with DiscoVisc. The intraocular lens was inserted into the capsular bag using a pre-loaded UltraSert Delivery System. Irrigation/aspiration was used to remove the residual DiscoVisc. The wound was inflated with balanced salt and checked for leaks. None were found. Miostat was injected via the paracentesis track and 0.1 ml of Vigamox containing 1 mg of drug  was injected via the paracentesis track. The wound was checked for leaks again and none were found.   The bridal sutures were removed and two drops of Vigamox were placed on the eye. An eye shield was placed to protect the eye and the patient was discharged to the recovery area in good condition.   Delayla Hoffmaster MD

## 2016-10-14 NOTE — OR Nursing (Signed)
Pt states she feels better. Her breathing and swallowing are better. O2 sats 100%. Pt states she feels like she got anxious during the procedure. States her breathing is good.

## 2017-01-16 ENCOUNTER — Ambulatory Visit (INDEPENDENT_AMBULATORY_CARE_PROVIDER_SITE_OTHER): Payer: Medicare Other | Admitting: Family Medicine

## 2017-01-16 ENCOUNTER — Encounter: Payer: Self-pay | Admitting: Family Medicine

## 2017-01-16 VITALS — BP 100/50 | HR 78 | Temp 97.5°F | Resp 16 | Ht 61.0 in | Wt 96.4 lb

## 2017-01-16 DIAGNOSIS — R630 Anorexia: Secondary | ICD-10-CM

## 2017-01-16 DIAGNOSIS — R300 Dysuria: Secondary | ICD-10-CM | POA: Diagnosis not present

## 2017-01-16 DIAGNOSIS — E034 Atrophy of thyroid (acquired): Secondary | ICD-10-CM

## 2017-01-16 DIAGNOSIS — R5383 Other fatigue: Secondary | ICD-10-CM

## 2017-01-16 DIAGNOSIS — K219 Gastro-esophageal reflux disease without esophagitis: Secondary | ICD-10-CM | POA: Diagnosis not present

## 2017-01-16 DIAGNOSIS — J431 Panlobular emphysema: Secondary | ICD-10-CM

## 2017-01-16 LAB — POCT CBC
GRANULOCYTE PERCENT: 54.5 % (ref 37–80)
HEMATOCRIT: 40.6 % (ref 37.7–47.9)
Hemoglobin: 13.5 g/dL (ref 12.2–16.2)
Lymph, poc: 2.2 (ref 0.6–3.4)
MCH, POC: 27.4 pg (ref 27–31.2)
MCHC: 33.4 g/dL (ref 31.8–35.4)
MCV: 82.2 fL (ref 80–97)
MID (CBC): 1 — AB (ref 0–0.9)
MPV: 8.6 fL (ref 0–99.8)
POC Granulocyte: 3.9 (ref 2–6.9)
POC LYMPH PERCENT: 31.4 %L (ref 10–50)
POC MID %: 14.1 %M — AB (ref 0–12)
Platelet Count, POC: 255 10*3/uL (ref 142–424)
RBC: 4.93 M/uL (ref 4.04–5.48)
RDW, POC: 14 %
WBC: 7.1 10*3/uL (ref 4.6–10.2)

## 2017-01-16 LAB — POCT URINALYSIS DIP (MANUAL ENTRY)
BILIRUBIN UA: NEGATIVE
Blood, UA: NEGATIVE
GLUCOSE UA: NEGATIVE mg/dL
Ketones, POC UA: NEGATIVE mg/dL
Leukocytes, UA: NEGATIVE
NITRITE UA: NEGATIVE
Protein Ur, POC: NEGATIVE mg/dL
SPEC GRAV UA: 1.01 (ref 1.010–1.025)
UROBILINOGEN UA: 0.2 U/dL
pH, UA: 6.5 (ref 5.0–8.0)

## 2017-01-16 LAB — POC MICROSCOPIC URINALYSIS (UMFC): MUCUS RE: ABSENT

## 2017-01-16 NOTE — Patient Instructions (Addendum)
     IF you received an x-ray today, you will receive an invoice from [facility]. Please contact [facility] at [phone] with questions or concerns regarding your invoice.   IF you received labwork today, you will receive an invoice from [facility]. Please contact [facility] at [phone] with questions or concerns regarding your invoice.   Our billing staff will not be able to assist you with questions regarding bills from these companies.  You will be contacted with the lab results as soon as they are available. The fastest way to get your results is to activate your My Chart account. Instructions are located on the last page of this paperwork. If you have not heard from us regarding the results in 2 weeks, please contact this office.      Fatigue Fatigue is feeling tired all of the time, a lack of energy, or a lack of motivation. Occasional or mild fatigue is often a normal response to activity or life in general. However, long-lasting (chronic) or extreme fatigue may indicate an underlying medical condition. Follow these instructions at home: Watch your fatigue for any changes. The following actions may help to lessen any discomfort you are feeling:  Talk to your health care provider about how much sleep you need each night. Try to get the required amount every night.  Take medicines only as directed by your health care provider.  Eat a healthy and nutritious diet. Ask your health care provider if you need help changing your diet.  Drink enough fluid to keep your urine clear or pale yellow.  Practice ways of relaxing, such as yoga, meditation, massage therapy, or acupuncture.  Exercise regularly.  Change situations that cause you stress. Try to keep your work and personal routine reasonable.  Do not abuse illegal drugs.  Limit alcohol intake to no more than 1 drink per day for nonpregnant women and 2 drinks per day for men. One drink equals 12 ounces of  beer, 5 ounces of wine, or 1 ounces of hard liquor.  Take a multivitamin, if directed by your health care provider.  Contact a health care provider if:  Your fatigue does not get better.  You have a fever.  You have unintentional weight loss or gain.  You have headaches.  You have difficulty: ? Falling asleep. ? Sleeping throughout the night.  You feel angry, guilty, anxious, or sad.  You are unable to have a bowel movement (constipation).  You skin is dry.  Your legs or another part of your body is swollen. Get help right away if:  You feel confused.  Your vision is blurry.  You feel faint or pass out.  You have a severe headache.  You have severe abdominal, pelvic, or back pain.  You have chest pain, shortness of breath, or an irregular or fast heartbeat.  You are unable to urinate or you urinate less than normal.  You develop abnormal bleeding, such as bleeding from the rectum, vagina, nose, lungs, or nipples.  You vomit blood.  You have thoughts about harming yourself or committing suicide.  You are worried that you might harm someone else. This information is not intended to replace advice given to you by your health care provider. Make sure you discuss any questions you have with your health care provider. Document Released: 01/18/2007 Document Revised: 08/29/2015 Document Reviewed: 07/25/2013 Elsevier Interactive Patient Education  2018 [employer]  

## 2017-01-16 NOTE — Progress Notes (Signed)
Subjective:    Patient ID: EDWARD TREVINO, female    DOB: March 17, 1935, 82 y.o.   MRN: [id number]  01/16/2017  Fatigue (and no appetite for about a month)    HPI This 82 y.o. female presents for evaluation of decreased appetite and fatigue.  Really tired.  No fever/chills/sweats. No headache.  Some nasal congestion chronic; allergic rhinitis.  No sinus pressure.  No cough.  Rare GERD; takes a lot of calcium.  No nausea; no vomiting; no diarrhea; constipation; going every day but small amounts; not eating much; drinkig protein shakes.  No bloody stools; no melena.  Slim stool.  Discussed with gastroenterology.  S/p consultation at [facility] in [place].  Mild dysuria within past week.  No hematuria; mild urgency; no nocturia.  Bedtime 1000; waking up 800.  Sleeping well.  No chest pain; no SOB.  No leg swelling.  Not walking because feels so weak.  Can only walk 1/2 block.    BP Readings from Last 3 Encounters:  01/16/17 (!) 100/50  10/14/16 (!) 117/42  07/15/16 (!) 107/42   Wt Readings from Last 3 Encounters:  01/16/17 96 lb 6.4 oz (43.7 kg)  10/14/16 95 lb (43.1 kg)  07/09/16 95 lb (43.1 kg)   Immunization History  Administered Date(s) Administered  . Influenza Split 01/31/2013, 01/04/2014  . Influenza,inj,Quad PF,6+ Mos 01/09/2015  . Pneumococcal Conjugate-13 06/04/2013  . Pneumococcal Polysaccharide-23 05/21/2015  . Tdap 04/06/2006  . Zoster 04/07/2007    Review of Systems  Constitutional: Positive for activity change, appetite change and fatigue. Negative for chills, diaphoresis and fever.  HENT: Positive for congestion and postnasal drip. Negative for ear pain, sore throat, trouble swallowing and voice change.   Eyes: Negative for visual disturbance.  Respiratory: Negative for cough and shortness of breath.   Cardiovascular: Negative for chest pain, palpitations and leg swelling.  Gastrointestinal: Negative for abdominal pain, constipation, diarrhea, nausea and vomiting.    Endocrine: Negative for cold intolerance, heat intolerance, polydipsia, polyphagia and polyuria.  Genitourinary: Positive for dysuria. Negative for flank pain, frequency, genital sores, hematuria, pelvic pain and urgency.  Neurological: Negative for dizziness, tremors, seizures, syncope, facial asymmetry, speech difficulty, weakness, light-headedness, numbness and headaches.  Psychiatric/Behavioral: Negative for dysphoric mood and sleep disturbance. The patient is not nervous/anxious.     Past Medical History:  Diagnosis Date  . Asthma   . Benign positional vertigo    s/p ENT consultation with Eply maneuvers 07/24/12.  [facility].  . Cataract   . Complication of anesthesia   . COPD (chronic obstructive pulmonary disease) ([place])   . Diarrhea   . Dyspnea    DOE  . GERD (gastroesophageal reflux disease)    has not bothered her for a while, especially when watching diet  . 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 Hypothyroidism   . MVP (mitral valve prolapse)   . Myocardial infarction ([place]) July 25, 1995   when husband died  . Osteoporosis    R hip fracture; L hip fracture.  Intolerance to bisphosphonates; GI upset.  Took bisphosphonates x 10 years; last bisphosphonates 07/25/1998.  s/p L wrist fracture July 25, 1999.  Marland Kitchen Pancreas anomaly, congenital   . PONV (postoperative nausea and vomiting)   . Thyroid disease    Hypothyroidism   Past Surgical History:  Procedure Laterality Date  . APPENDECTOMY    . CATARACT EXTRACTION W/PHACO Right 07/15/2016   Procedure: CATARACT EXTRACTION PHACO AND INTRAOCULAR LENS PLACEMENT (IOC);  Surgeon: Estill Cotta, MD;  Location: [facility] ORS;  Service: Ophthalmology;  Laterality: Right;  [place] 3:03.8 AP% 25.8 CDE 92.59 FLUID PACK LOT # [id number]  . CATARACT EXTRACTION W/PHACO Left 10/14/2016   Procedure: CATARACT EXTRACTION PHACO AND INTRAOCULAR LENS PLACEMENT (IOC);  Surgeon: Estill Cotta, MD;  Location: [facility] ORS;  Service: Ophthalmology;   Laterality: Left;  [place] 01:34.5 AP% 26.6 CDE 43.55 Fluid pack lot # [id number]  . COLONOSCOPY     x 2; failure due to spasms.  [facility].  Marland Kitchen ENT consult     +BPV; Eply maneuvers wtih improvement.  [facility]/ENT.  Marland Kitchen FRACTURE SURGERY     R hip fracture s/p ORIF; L hip fracture s/p ORIF.  Marland Kitchen LAPAROSCOPIC SALPINGO OOPHERECTOMY    . TONSILLECTOMY    . TUBAL LIGATION     Allergies  Allergen Reactions  . Biaxin [Clarithromycin] Other (See Comments)    Had a strong a reaction to it (can't remember specifically).Patiet thinks the dose was too strong for her body weight  . Gluten Meal     Stomach upset  . Lactose Intolerance (Gi) Nausea And Vomiting  . Penicillins Hives and Rash  . Sudafed [Pseudoephedrine Hcl]     Can't recall reaction  . Sulfa Antibiotics Hives and Rash   Current Outpatient Prescriptions on File Prior to Visit  Medication Sig Dispense Refill  . Calcium Citrate-Vitamin D (CALCIUM CITRATE + D PO) Take 1 tablet by mouth daily.    . Ipratropium-Albuterol (COMBIVENT) 20-100 MCG/ACT AERS respimat Inhale 1 puff into the lungs every 6 (six) hours. 4 g 11  . Multiple Vitamin (MULTI-VITAMINS) TABS Take 1 tablet by mouth.     . thyroid (ARMOUR THYROID) 15 MG tablet Take 1 tablet (15 mg total) by mouth daily. (Patient taking differently: Take 45 mg by mouth daily. ) 90 tablet 3   No current facility-administered medications on file prior to visit.    Social History   Social History  . Marital status: Widowed    Spouse name: N/A  . Number of children: 1  . Years of education: N/A   Occupational History  . retired    Social History Main Topics  . Smoking status: Passive Smoke Exposure - Never Smoker  . Smokeless tobacco: Never Used     Comment: pt's father and husband both smoked in house.   Marland Kitchen Alcohol use No  . Drug use: No  . Sexual activity: Not Currently   Other Topics Concern  . Not on file   Social History Narrative   Marital status: widowed x 1997 after  30+ years; not dating; not interested.       Children:  1 daughter; 2 granddaughters; no gg.      Lives:  With daughter [name]), son-in-law      Tobacco: never; husband smoked      Alcohol: none      Exercise:  Walking three to four days per week at [facility] for 30 minutes.      ADLs:  No assistant devices; has a cane for PRN use.  Drives; goes to grocery store; no cleaning; no bill paying; daughter pays bills.        Advanced Directives:  +LIVING WILL; +FULL CODE.  HCPOA: daughter(Shelley).       Volunteers at [facility] once per week; visits; plays bridge two days per week at neighbors or home; has small church group on Wednesdays; goes to church on Sundays.    Family History  Problem  Relation Age of Onset  . Cancer Sister        Terminal cancer in 2017; Breast cancer; endometrial cancer; vaginal cancer  . COPD Sister   . Breast cancer Sister   . Cancer Sister        Breast cancer  . Breast cancer Sister   . Cancer Mother 52       cervical cancer  . Asthma Mother   . COPD Father   . Cancer Sister        Melanoma       Objective:    BP (!) 100/50   Pulse 78   Temp (!) 97.5 F (36.4 C)   Resp 16   Ht 5\' 1"  (1.549 m)   Wt 96 lb 6.4 oz (43.7 kg)   BMI 18.21 kg/m  Physical Exam  Constitutional: She is oriented to person, place, and time. She appears well-developed and well-nourished. No distress.  HENT:  Head: Normocephalic and atraumatic.  Right Ear: External ear normal.  Left Ear: External ear normal.  Nose: Nose normal.  Mouth/Throat: Oropharynx is clear and moist.  Eyes: Pupils are equal, round, and reactive to light. Conjunctivae and EOM are normal.  Neck: Normal range of motion. Neck supple. Carotid bruit is not present. No thyromegaly present.  Cardiovascular: Normal rate, regular rhythm, normal heart sounds and intact distal pulses.  Exam reveals no gallop and no friction rub.   No murmur heard. Pulmonary/Chest: Effort normal and breath sounds normal. She  has no wheezes. She has no rales.  Abdominal: Soft. Bowel sounds are normal. She exhibits no distension and no mass. There is no tenderness. There is no rebound and no guarding.  Lymphadenopathy:    She has no cervical adenopathy.  Neurological: She is alert and oriented to person, place, and time. No cranial nerve deficit. She exhibits normal muscle tone. Coordination normal.  Skin: Skin is warm and dry. No rash noted. She is not diaphoretic. No erythema. No pallor.  Psychiatric: She has a normal mood and affect. Her behavior is normal. Judgment and thought content normal.   No results found. Depression screen [facility] 2/9 01/16/2017 05/20/2016 05/20/2016 01/29/2016 01/17/2016  Decreased Interest 0 0 0 0 0  Down, Depressed, Hopeless 0 0 0 0 0  PHQ - 2 Score 0 0 0 0 0   Fall Risk  01/16/2017 05/20/2016 05/20/2016 01/29/2016 01/08/2016  Falls in the past year? No No No No No    Results for orders placed or performed in visit on 01/16/17  POCT urinalysis dipstick  Result Value Ref Range   Color, UA yellow yellow   Clarity, UA clear clear   Glucose, UA negative negative mg/dL   Bilirubin, UA negative negative   Ketones, POC UA negative negative mg/dL   Spec Grav, UA 1.010 1.010 - 1.025   Blood, UA negative negative   pH, UA 6.5 5.0 - 8.0   Protein Ur, POC negative negative mg/dL   Urobilinogen, UA 0.2 0.2 or 1.0 E.U./dL   Nitrite, UA Negative Negative   Leukocytes, UA Negative Negative       Assessment & Plan:   1. Other fatigue   2. Decreased appetite   3. Dysuria   4. Panlobular emphysema ([place])   5. Gastroesophageal reflux disease without esophagitis   6. Hypothyroidism due to acquired atrophy of thyroid     -New onset fatigue and decreased appetite with dysuria for one month. -obtain labs to rule out secondary causes of symptoms. -recommend starting  exercise program; encourage to exercise every day for 30 minutes.  -no evidence of underlying depression or anxiety.   Orders  Placed This Encounter  Procedures  . Urine Culture    Order Specific Question:   Source    Answer:   clean catch  . Comprehensive metabolic panel  . TSH  . T4, free  . POCT CBC  . POCT urinalysis dipstick  . POCT Microscopic Urinalysis ([facility])   No orders of the defined types were placed in this encounter.   No Follow-up on file.   Thailan Sava Elayne Guerin, M.D. Primary Care at [facility] [address] [place], [place]  [address] [phone] phone [phone] fax

## 2017-01-17 LAB — COMPREHENSIVE METABOLIC PANEL
A/G RATIO: 1.4 (ref 1.2–2.2)
ALBUMIN: 4.4 g/dL (ref 3.5–4.7)
ALK PHOS: 59 IU/L (ref 39–117)
ALT: 19 IU/L (ref 0–32)
AST: 26 IU/L (ref 0–40)
BILIRUBIN TOTAL: 0.3 mg/dL (ref 0.0–1.2)
BUN / CREAT RATIO: 42 — AB (ref 12–28)
BUN: 24 mg/dL (ref 8–27)
CHLORIDE: 101 mmol/L (ref 96–106)
CO2: 22 mmol/L (ref 20–29)
Calcium: 9.5 mg/dL (ref 8.7–10.3)
Creatinine, Ser: 0.57 mg/dL (ref 0.57–1.00)
GFR calc non Af Amer: 88 mL/min/{1.73_m2} (ref >59)
GFR, EST AFRICAN AMERICAN: 101 mL/min/{1.73_m2} (ref >59)
GLOBULIN, TOTAL: 3.1 g/dL (ref 1.5–4.5)
GLUCOSE: 94 mg/dL (ref 65–99)
Potassium: 4.2 mmol/L (ref 3.5–5.2)
SODIUM: 142 mmol/L (ref 134–144)
TOTAL PROTEIN: 7.5 g/dL (ref 6.0–8.5)

## 2017-01-17 LAB — URINE CULTURE

## 2017-01-17 LAB — TSH: TSH: 0.127 u[IU]/mL — AB (ref 0.450–4.500)

## 2017-01-17 LAB — T4, FREE: Free T4: 1.04 ng/dL (ref 0.82–1.77)

## 2017-03-16 ENCOUNTER — Ambulatory Visit: Payer: Medicare Other | Admitting: Pulmonary Disease

## 2017-04-09 ENCOUNTER — Ambulatory Visit: Payer: Medicare Other | Admitting: Pulmonary Disease

## 2017-04-09 ENCOUNTER — Encounter: Payer: Self-pay | Admitting: Pulmonary Disease

## 2017-04-09 VITALS — BP 92/58 | HR 86 | Ht 61.0 in | Wt 97.0 lb

## 2017-04-09 DIAGNOSIS — D71 Functional disorders of polymorphonuclear neutrophils: Secondary | ICD-10-CM | POA: Diagnosis not present

## 2017-04-09 DIAGNOSIS — J449 Chronic obstructive pulmonary disease, unspecified: Secondary | ICD-10-CM | POA: Diagnosis not present

## 2017-04-09 DIAGNOSIS — J8489 Other specified interstitial pulmonary diseases: Secondary | ICD-10-CM

## 2017-04-09 MED ORDER — ALBUTEROL SULFATE HFA 108 (90 BASE) MCG/ACT IN AERS: 2.0000 | INHALATION_SPRAY | RESPIRATORY_TRACT | 2 refills | Status: DC | PRN

## 2017-04-09 NOTE — Patient Instructions (Signed)
Continue Combivent inhaler as you are currently using Follow-up as needed for any breathing or lung issues

## 2017-04-11 NOTE — Progress Notes (Signed)
PULMONARY OFFICE FOLLOW UP NOTE  Requesting MD/Service: Self referred Date of initial consultation: 09/26/15 Reason for consultation: Dyspnea, prior dx of COPD (previously managed @ [facility])  PT PROFILE: 83 y.o. never smoker with significant second hand smoke exposure and previously diagnosed with COPD @ [facility], self-referred for dyspnea which manifests mostly when she overeats.   DATA: 10/21/15 PFTs: moderate obstruction, normal lung volumes, moderate decrease in DLCO 10/01/15 CXR: Moderate kyphosis, mild hyperinflation, NACPD  SUBJ: Last seen in 2017.  She returns today to obtain refills of her inhaler medicines.  She has done well with no major pulmonary events since last visit.  She has had no major hospitalizations.  She does complain of sinus congestion for which she uses saline rinses.  She does not like Flonase inhaler.  Presently, she is using Combivent inhaler, 1 actuation 3 or 4 times per day. Denies CP, fever, purulent sputum, hemoptysis, LE edema and calf tenderness   OBJ:  Vitals:   04/09/17 1419 04/09/17 1424  BP:  (!) 92/58  Pulse:  86  SpO2:  98%  Weight: 44 kg (97 lb)   Height: 5\' 1"  (1.549 m)      EXAM:  Gen: Thin, No overt respiratory distress HEENT: NCAT, sclera white Neck: Supple without LAN, thyromegaly, JVD Lungs: breath sounds mildly decreased, No wheezes or other adventitious sounds Cardiovascular: RRR, no murmurs noted Abdomen: Soft, nontender, normal BS Ext: without clubbing, cyanosis, edema Neuro: grossly intact  DATA:   BMP Latest Ref Rng & Units 01/16/2017 05/20/2016 01/29/2016  Glucose 65 - 99 mg/dL 94 91 90  BUN 8 - 27 mg/dL 24 19 17   Creatinine 0.57 - 1.00 mg/dL 0.57 0.64 0.57(L)  BUN/Creat Ratio 12 - 28 42(H) 30(H) -  Sodium 134 - 144 mmol/L 142 142 140  Potassium 3.5 - 5.2 mmol/L 4.2 4.8 4.6  Chloride 96 - 106 mmol/L 101 100 103  CO2 20 - 29 mmol/L 22 25 25   Calcium 8.7 - 10.3 mg/dL 9.5 9.7 9.9    CBC Latest Ref Rng & Units 01/16/2017  05/20/2016 01/29/2016  WBC 4.6 - 10.2 K/uL 7.1 5.9 5.8  Hemoglobin 12.2 - 16.2 g/dL 13.5 13.4 13.3  Hematocrit 37.7 - 47.9 % 40.6 40.6 39.3  Platelets 150 - 379 x10E3/uL - 303 305    CXR: No new film  IMPRESSION:     ICD-10-CM   1. COPD, moderate ([place]) J44.9   2. Old granulomatous disease of chest J84.89    D71      PLAN:  Continue Combivent inhaler -prescription refilled Follow-up as needed for any breathing or lung issues   Merton Border, MD PCCM service Mobile [phone] Pager [phone] 04/11/2017 2:25 PM

## 2017-04-29 ENCOUNTER — Telehealth: Payer: Self-pay

## 2017-04-29 NOTE — Telephone Encounter (Signed)
Called pt and scheduled AWV. -NR   Copied from [place] [phone]. Topic: Medicare AWV >> Apr 29, 2017  8:59 AM Arletha Grippe wrote: Reason for CRM: pt would like to schedule awv, she was last seen 05/20/16. She thought she had 05/25/17 appt, but there is not one. Please call [phone]

## 2017-05-25 ENCOUNTER — Ambulatory Visit: Payer: Medicare Other

## 2017-05-26 ENCOUNTER — Other Ambulatory Visit: Payer: Self-pay | Admitting: Family Medicine

## 2017-06-08 ENCOUNTER — Other Ambulatory Visit: Payer: Self-pay

## 2017-06-08 ENCOUNTER — Other Ambulatory Visit: Payer: Self-pay | Admitting: Family Medicine

## 2017-06-08 DIAGNOSIS — J431 Panlobular emphysema: Secondary | ICD-10-CM

## 2017-06-08 MED ORDER — IPRATROPIUM-ALBUTEROL 20-100 MCG/ACT IN AERS: 1.0000 | INHALATION_SPRAY | Freq: Four times a day (QID) | RESPIRATORY_TRACT | 11 refills | Status: DC

## 2017-06-22 ENCOUNTER — Ambulatory Visit: Payer: Medicare Other

## 2017-06-25 ENCOUNTER — Other Ambulatory Visit: Payer: Self-pay | Admitting: Family Medicine

## 2017-06-29 ENCOUNTER — Ambulatory Visit (INDEPENDENT_AMBULATORY_CARE_PROVIDER_SITE_OTHER): Payer: Medicare Other

## 2017-06-29 ENCOUNTER — Ambulatory Visit: Payer: Medicare Other

## 2017-06-29 VITALS — BP 100/62 | HR 100 | Ht 61.0 in | Wt 95.2 lb

## 2017-06-29 DIAGNOSIS — Z0000 Encounter for general adult medical examination without abnormal findings: Secondary | ICD-10-CM

## 2017-06-29 NOTE — Patient Instructions (Addendum)
Shelia Fernandez , Thank you for taking time to come for your Medicare Wellness Visit. I appreciate your ongoing commitment to your health goals. Please review the following plan we discussed and let me know if I can assist you in the future.   Screening recommendations/referrals: Colonoscopy: no longer required Mammogram: no longer required  Bone Density: declined  Recommended yearly ophthalmology/optometry visit for glaucoma screening and checkup Recommended yearly dental visit for hygiene and checkup  Vaccinations: Influenza vaccine: up to date Pneumococcal vaccine: up to date Tdap vaccine: declined due to insurance  Shingles vaccine: Check with your pharmacy about receiving the Shingrix vaccine    Advanced directives: Please bring a copy of your POA (Power of Attorney) and/or Living Will to your next appointment.   Conditions/risks identified: Try to increase her exercise to hopefully improve her walking ability.   Next appointment: schedule follow up visit with PCP, next AWV in 1 year    Preventive Care 86 Years and Older, Female Preventive care refers to lifestyle choices and visits with your health care provider that can promote health and wellness. What does preventive care include?  A yearly physical exam. This is also called an annual well check.  Dental exams once or twice a year.  Routine eye exams. Ask your health care provider how often you should have your eyes checked.  Personal lifestyle choices, including:  Daily care of your teeth and gums.  Regular physical activity.  Eating a healthy diet.  Avoiding tobacco and drug use.  Limiting alcohol use.  Practicing safe sex.  Taking low-dose aspirin every day.  Taking vitamin and mineral supplements as recommended by your health care provider. What happens during an annual well check? The services and screenings done by your health care provider during your annual well check will depend on your age, overall  health, lifestyle risk factors, and family history of disease. Counseling  Your health care provider may ask you questions about your:  Alcohol use.  Tobacco use.  Drug use.  Emotional well-being.  Home and relationship well-being.  Sexual activity.  Eating habits.  History of falls.  Memory and ability to understand (cognition).  Work and work Statistician.  Reproductive health. Screening  You may have the following tests or measurements:  Height, weight, and BMI.  Blood pressure.  Lipid and cholesterol levels. These may be checked every 5 years, or more frequently if you are over 61 years old.  Skin check.  Lung cancer screening. You may have this screening every year starting at age 94 if you have a 30-pack-year history of smoking and currently smoke or have quit within the past 15 years.  Fecal occult blood test (FOBT) of the stool. You may have this test every year starting at age 82.  Flexible sigmoidoscopy or colonoscopy. You may have a sigmoidoscopy every 5 years or a colonoscopy every 10 years starting at age 63.  Hepatitis C blood test.  Hepatitis B blood test.  Sexually transmitted disease (STD) testing.  Diabetes screening. This is done by checking your blood sugar (glucose) after you have not eaten for a while (fasting). You may have this done every 1-3 years.  Bone density scan. This is done to screen for osteoporosis. You may have this done starting at age 70.  Mammogram. This may be done every 1-2 years. Talk to your health care provider about how often you should have regular mammograms. Talk with your health care provider about your test results, treatment options, and if  necessary, the need for more tests. Vaccines  Your health care provider may recommend certain vaccines, such as:  Influenza vaccine. This is recommended every year.  Tetanus, diphtheria, and acellular pertussis (Tdap, Td) vaccine. You may need a Td booster every 10  years.  Zoster vaccine. You may need this after age 20.  Pneumococcal 13-valent conjugate (PCV13) vaccine. One dose is recommended after age 62.  Pneumococcal polysaccharide (PPSV23) vaccine. One dose is recommended after age 70. Talk to your health care provider about which screenings and vaccines you need and how often you need them. This information is not intended to replace advice given to you by your health care provider. Make sure you discuss any questions you have with your health care provider. Document Released: 04/19/2015 Document Revised: 12/11/2015 Document Reviewed: 01/22/2015 Elsevier Interactive Patient Education  2017 [place] Prevention in the Home Falls can cause injuries. They can happen to people of all ages. There are many things you can do to make your home safe and to help prevent falls. What can I do on the outside of my home?  Regularly fix the edges of walkways and driveways and fix any cracks.  Remove anything that might make you trip as you walk through a door, such as a raised step or threshold.  Trim any bushes or trees on the path to your home.  Use bright outdoor lighting.  Clear any walking paths of anything that might make someone trip, such as rocks or tools.  Regularly check to see if handrails are loose or broken. Make sure that both sides of any steps have handrails.  Any raised decks and porches should have guardrails on the edges.  Have any leaves, snow, or ice cleared regularly.  Use sand or salt on walking paths during winter.  Clean up any spills in your garage right away. This includes oil or grease spills. What can I do in the bathroom?  Use night lights.  Install grab bars by the toilet and in the tub and shower. Do not use towel bars as grab bars.  Use non-skid mats or decals in the tub or shower.  If you need to sit down in the shower, use a plastic, non-slip stool.  Keep the floor dry. Clean up any water that  spills on the floor as soon as it happens.  Remove soap buildup in the tub or shower regularly.  Attach bath mats securely with double-sided non-slip rug tape.  Do not have throw rugs and other things on the floor that can make you trip. What can I do in the bedroom?  Use night lights.  Make sure that you have a light by your bed that is easy to reach.  Do not use any sheets or blankets that are too big for your bed. They should not hang down onto the floor.  Have a firm chair that has side arms. You can use this for support while you get dressed.  Do not have throw rugs and other things on the floor that can make you trip. What can I do in the kitchen?  Clean up any spills right away.  Avoid walking on wet floors.  Keep items that you use a lot in easy-to-reach places.  If you need to reach something above you, use a strong step stool that has a grab bar.  Keep electrical cords out of the way.  Do not use floor polish or wax that makes floors slippery. If you must  use wax, use non-skid floor wax.  Do not have throw rugs and other things on the floor that can make you trip. What can I do with my stairs?  Do not leave any items on the stairs.  Make sure that there are handrails on both sides of the stairs and use them. Fix handrails that are broken or loose. Make sure that handrails are as long as the stairways.  Check any carpeting to make sure that it is firmly attached to the stairs. Fix any carpet that is loose or worn.  Avoid having throw rugs at the top or bottom of the stairs. If you do have throw rugs, attach them to the floor with carpet tape.  Make sure that you have a light switch at the top of the stairs and the bottom of the stairs. If you do not have them, ask someone to add them for you. What else can I do to help prevent falls?  Wear shoes that:  Do not have high heels.  Have rubber bottoms.  Are comfortable and fit you well.  Are closed at the  toe. Do not wear sandals.  If you use a stepladder:  Make sure that it is fully opened. Do not climb a closed stepladder.  Make sure that both sides of the stepladder are locked into place.  Ask someone to hold it for you, if possible.  Clearly mark and make sure that you can see:  Any grab bars or handrails.  First and last steps.  Where the edge of each step is.  Use tools that help you move around (mobility aids) if they are needed. These include:  Canes.  Walkers.  Scooters.  Crutches.  Turn on the lights when you go into a dark area. Replace any light bulbs as soon as they burn out.  Set up your furniture so you have a clear path. Avoid moving your furniture around.  If any of your floors are uneven, fix them.  If there are any pets around you, be aware of where they are.  Review your medicines with your doctor. Some medicines can make you feel dizzy. This can increase your chance of falling. Ask your doctor what other things that you can do to help prevent falls. This information is not intended to replace advice given to you by your health care provider. Make sure you discuss any questions you have with your health care provider. Document Released: 01/17/2009 Document Revised: 08/29/2015 Document Reviewed: 04/27/2014 Elsevier Interactive Patient Education  2017 [employer].

## 2017-06-29 NOTE — Progress Notes (Signed)
Subjective:   Shelia Fernandez is a 83 y.o. female who presents for Medicare Annual (Subsequent) preventive examination.  Review of Systems:  N/A Cardiac Risk Factors include: advanced age (>31men, >8 women)     Objective:     Vitals: BP 100/62   Pulse 100   Ht 5\' 1"  (1.549 m)   Wt 95 lb 4 oz (43.2 kg)   SpO2 95%   BMI 18.00 kg/m   Body mass index is 18 kg/m.  Advanced Directives 06/29/2017 10/14/2016 05/20/2016 05/21/2015  Does Patient Have a Medical Advance Directive? Yes Yes Yes Yes  Type of Paramedic of [place];Living will [place];Living will Living will [place];Living will  Does patient want to make changes to medical advance directive? - No - Patient declined - No - Patient declined  Copy of [place] in Chart? No - copy requested No - copy requested - No - copy requested    Tobacco Social History   Tobacco Use  Smoking Status Passive Smoke Exposure - Never Smoker  Smokeless Tobacco Never Used  Tobacco Comment   pt's father and husband both smoked in house.      Counseling given: Not Answered Comment: pt's father and husband both smoked in house.    Clinical Intake:  Pre-visit preparation completed: Yes  Pain : No/denies pain     Nutritional Status: BMI <19  Underweight Nutritional Risks: None Diabetes: No  How often do you need to have someone help you when you read instructions, pamphlets, or other written materials from your doctor or pharmacy?: 1 - Never What is the last grade level you completed in school?: 2 graduate degrees  Interpreter Needed?: No  Information entered by :: Andrez Grime, LPN  Past Medical History:  Diagnosis Date  . Asthma   . Benign positional vertigo    s/p ENT consultation with Eply maneuvers 07-19-2012.  [facility].  . Cataract   . Complication of anesthesia   . COPD (chronic obstructive pulmonary disease) ([place])   . Diarrhea   .  Dyspnea    DOE  . GERD (gastroesophageal reflux disease)    has not bothered her for a while, especially when watching diet  . 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 Hypothyroidism   . MVP (mitral valve prolapse)   . Myocardial infarction ([place]) 07-20-95   when husband died  . Osteoporosis    R hip fracture; L hip fracture.  Intolerance to bisphosphonates; GI upset.  Took bisphosphonates x 10 years; last bisphosphonates 07-20-1998.  s/p L wrist fracture 07/20/99.  Marland Kitchen Pancreas anomaly, congenital   . PONV (postoperative nausea and vomiting)   . Thyroid disease    Hypothyroidism   Past Surgical History:  Procedure Laterality Date  . APPENDECTOMY    . CATARACT EXTRACTION W/PHACO Right 07/15/2016   Procedure: CATARACT EXTRACTION PHACO AND INTRAOCULAR LENS PLACEMENT (IOC);  Surgeon: Estill Cotta, MD;  Location: [facility] ORS;  Service: Ophthalmology;  Laterality: Right;  [place] 3:03.8 AP% 25.8 CDE 92.59 FLUID PACK LOT # [id number]  . CATARACT EXTRACTION W/PHACO Left 10/14/2016   Procedure: CATARACT EXTRACTION PHACO AND INTRAOCULAR LENS PLACEMENT (IOC);  Surgeon: Estill Cotta, MD;  Location: [facility] ORS;  Service: Ophthalmology;  Laterality: Left;  [place] 01:34.5 AP% 26.6 CDE 43.55 Fluid pack lot # [id number]  . COLONOSCOPY     x 2; failure due to spasms.  [place].  Marland Kitchen  ENT consult     +BPV; [facility] maneuvers wtih improvement.  [facility]/ENT.  Marland Kitchen FRACTURE SURGERY     R hip fracture s/p ORIF; L hip fracture s/p ORIF.  Marland Kitchen LAPAROSCOPIC SALPINGO OOPHERECTOMY    . TONSILLECTOMY    . TUBAL LIGATION     Family History  Problem Relation Age of Onset  . Cancer Sister        Terminal cancer in 2017; Breast cancer; endometrial cancer; vaginal cancer  . COPD Sister   . Breast cancer Sister   . Cancer Sister        Breast cancer  . Breast cancer Sister   . Cancer Mother 55       cervical cancer  . Asthma Mother   . COPD Father   . Cancer Sister        Melanoma    Social History   Socioeconomic History  . Marital status: Widowed    Spouse name: Not on file  . Number of children: 1  . Years of education: Not on file  . Highest education level: Professional school degree (e.g., MD, DDS, DVM, JD)  Occupational History  . Occupation: retired  Scientific laboratory technician  . Financial resource strain: Not hard at all  . Food insecurity:    Worry: Never true    Inability: Never true  . Transportation needs:    Medical: No    Non-medical: No  Tobacco Use  . Smoking status: Passive Smoke Exposure - Never Smoker  . Smokeless tobacco: Never Used  . Tobacco comment: pt's father and husband both smoked in house.   Substance and Sexual Activity  . Alcohol use: No    Alcohol/week: 0.0 oz  . Drug use: No  . Sexual activity: Not Currently  Lifestyle  . Physical activity:    Days per week: 7 days    Minutes per session: 60 min  . Stress: Not at all  Relationships  . Social connections:    Talks on phone: More than three times a week    Gets together: More than three times a week    Attends religious service: More than 4 times per year    Active member of club or organization: Yes    Attends meetings of clubs or organizations: More than 4 times per year    Relationship status: Widowed  Other Topics Concern  . Not on file  Social History Narrative   Marital status: widowed x 1997 after 30+ years; not dating; not interested.       Children:  1 daughter; 2 granddaughters; no gg.      Lives:  With daughter [name]), son-in-law      Tobacco: never; husband smoked      Alcohol: none      Exercise:  Walking three to four days per week at [facility] for 30 minutes.      ADLs:  No assistant devices; has a cane for PRN use.  Drives; goes to grocery store; no cleaning; no bill paying; daughter pays bills.        Advanced Directives:  +LIVING WILL; +FULL CODE.  HCPOA: daughter(Shelley).       Volunteers at [facility] once per week; visits; plays bridge two days per  week at neighbors or home; has small church group on Wednesdays; goes to church on Sundays.     Outpatient Encounter Medications as of 06/29/2017  Medication Sig  . albuterol (PROVENTIL HFA;VENTOLIN HFA) 108 (90 Base) MCG/ACT inhaler Inhale 2 puffs  into the lungs every 4 (four) hours as needed for wheezing or shortness of breath.  Francia Greaves THYROID 30 MG tablet TAKE ONE TABLET BY MOUTH DAILY  . Calcium Citrate-Vitamin D (CALCIUM CITRATE + D PO) Take 1 tablet by mouth daily.  . COMBIVENT RESPIMAT 20-100 MCG/ACT AERS respimat INHALE ONE INHALATION BY MOUTH EVERY 6 HOURS  . Ipratropium-Albuterol (COMBIVENT) 20-100 MCG/ACT AERS respimat Inhale 1 puff into the lungs every 6 (six) hours.  . Multiple Vitamin (MULTI-VITAMINS) TABS Take 1 tablet by mouth.   . thyroid (ARMOUR THYROID) 15 MG tablet Take 1 tablet (15 mg total) by mouth daily. (Patient taking differently: Take 45 mg by mouth daily. )   No facility-administered encounter medications on file as of 06/29/2017.     Activities of Daily Living In your present state of health, do you have any difficulty performing the following activities: 06/29/2017  Hearing? N  Vision? N  Difficulty concentrating or making decisions? N  Walking or climbing stairs? Y  Comment Patient has left hip pain sometimes  Dressing or bathing? N  Doing errands, shopping? N  Preparing Food and eating ? N  Using the Toilet? N  In the past six months, have you accidently leaked urine? Y  Comment Patient wears a pad sometimes  Do you have problems with loss of bowel control? N  Managing your Medications? N  Managing your Finances? N  Housekeeping or managing your Housekeeping? N  Some recent data might be hidden    Patient Care Team: Wardell Honour, MD as PCP - General (Family Medicine) Wilhelmina Mcardle, MD as Consulting Physician (Pulmonary Disease) Arvella Nigh, MD as Consulting Physician (Obstetrics and Gynecology)    Assessment:   This is a routine wellness  examination for Ali.  Exercise Activities and Dietary recommendations Current Exercise Habits: Home exercise routine, Type of exercise: walking, Time (Minutes): 60, Frequency (Times/Week): 7, Weekly Exercise (Minutes/Week): 420, Intensity: Moderate, Exercise limited by: None identified  Goals    . Exercise (improve on walking)     Patient would like to increase her exercise to hopefully improve her walking ability.        Fall Risk Fall Risk  06/29/2017 01/16/2017 05/20/2016 05/20/2016 01/29/2016  Falls in the past year? No No No No No   Is the patient's home free of loose throw rugs in walkways, pet beds, electrical cords, etc?   yes      Grab bars in the bathroom? no      Handrails on the stairs?   yes      Adequate lighting?   yes   Timed Get Up and Go performed: yes, completed within 30 seconds   Depression Screen PHQ 2/9 Scores 06/29/2017 01/16/2017 05/20/2016 05/20/2016  PHQ - 2 Score 0 0 0 0     Cognitive Function     6CIT Screen 06/29/2017  What Year? 0 points  What month? 0 points  What time? 0 points  Count back from 20 0 points  Months in reverse 0 points  Repeat phrase 2 points  Total Score 2    Immunization History  Administered Date(s) Administered  . Influenza Split 01/31/2013, 01/04/2014  . Influenza,inj,Quad PF,6+ Mos 01/09/2015  . Influenza-Unspecified 01/05/2012, 02/02/2013  . Pneumococcal Conjugate-13 06/09/2013  . Pneumococcal Polysaccharide-23 04/06/2008, 05/21/2015  . Tdap 04/06/2006  . Zoster 04/07/2007    Qualifies for Shingles Vaccine: Advised patient to check with her pharmacy about receiving the Shingrix vaccine   Screening Tests Health Maintenance  Topic Date Due  . TETANUS/TDAP  06/30/2018 (Originally 04/06/2016)  . INFLUENZA VACCINE  Completed  . DEXA SCAN  Completed  . PNA vac Low Risk Adult  Completed    Cancer Screenings: Lung: Low Dose CT Chest recommended if Age 70-80 years, 30 pack-year currently smoking OR have quit  w/in 15years. Patient does not qualify. Breast:  Up to date on Mammogram? No longer required   Up to date of Bone Density/Dexa? No, Patient declined Colorectal: no longer required   Additional Screenings:  Hepatitis B/HIV/Syphillis: not indicated  Hepatitis C Screening: not indicated   Patient declined Tdap vaccine.  Patient non fasting.    Plan:   I have personally reviewed and noted the following in the patient's chart:   . Medical and social history . Use of alcohol, tobacco or illicit drugs  . Current medications and supplements . Functional ability and status . Nutritional status . Physical activity . Advanced directives . List of other physicians . Hospitalizations, surgeries, and ER visits in previous 12 months . Vitals . Screenings to include cognitive, depression, and falls . Referrals and appointments  In addition, I have reviewed and discussed with patient certain preventive protocols, quality metrics, and best practice recommendations. A written personalized care plan for preventive services as well as general preventive health recommendations were provided to patient.     Andrez Grime, LPN  9/56/2130

## 2017-07-28 ENCOUNTER — Other Ambulatory Visit: Payer: Self-pay

## 2017-07-28 ENCOUNTER — Ambulatory Visit: Payer: Medicare Other | Admitting: Family Medicine

## 2017-07-28 ENCOUNTER — Encounter: Payer: Self-pay | Admitting: Family Medicine

## 2017-07-28 VITALS — BP 102/68 | HR 78 | Temp 97.8°F | Resp 16 | Ht 62.21 in | Wt 95.2 lb

## 2017-07-28 DIAGNOSIS — E034 Atrophy of thyroid (acquired): Secondary | ICD-10-CM | POA: Diagnosis not present

## 2017-07-28 DIAGNOSIS — R636 Underweight: Secondary | ICD-10-CM

## 2017-07-28 DIAGNOSIS — M810 Age-related osteoporosis without current pathological fracture: Secondary | ICD-10-CM

## 2017-07-28 DIAGNOSIS — E7800 Pure hypercholesterolemia, unspecified: Secondary | ICD-10-CM | POA: Diagnosis not present

## 2017-07-28 DIAGNOSIS — J431 Panlobular emphysema: Secondary | ICD-10-CM

## 2017-07-28 DIAGNOSIS — Z0000 Encounter for general adult medical examination without abnormal findings: Secondary | ICD-10-CM | POA: Diagnosis not present

## 2017-07-28 DIAGNOSIS — K219 Gastro-esophageal reflux disease without esophagitis: Secondary | ICD-10-CM | POA: Diagnosis not present

## 2017-07-28 LAB — POCT URINALYSIS DIP (MANUAL ENTRY)
BILIRUBIN UA: NEGATIVE
Glucose, UA: NEGATIVE mg/dL
Ketones, POC UA: NEGATIVE mg/dL
NITRITE UA: NEGATIVE
Protein Ur, POC: NEGATIVE mg/dL
Spec Grav, UA: 1.015 (ref 1.010–1.025)
UROBILINOGEN UA: 0.2 U/dL
pH, UA: 6.5 (ref 5.0–8.0)

## 2017-07-28 MED ORDER — ZOSTER VAC RECOMB ADJUVANTED 50 MCG/0.5ML IM SUSR: 0.5000 mL | Freq: Once | INTRAMUSCULAR | 1 refills | Status: AC

## 2017-07-28 MED ORDER — THYROID 15 MG PO TABS: 15.0000 mg | ORAL_TABLET | ORAL | 1 refills | Status: DC

## 2017-07-28 NOTE — Progress Notes (Signed)
Subjective:    Patient ID: Shelia Fernandez, female    DOB: [birth year], 83 y.o.   MRN: [id number]  07/28/2017  Annual Exam    HPI This 83 y.o. female presents for COMPLETE PHYSICAL EXAMINATION and follow-up of chronic medical conditions.    Visual Acuity Screening   Right eye Left eye Both eyes  Without correction: 20/25 20/25 20/25   With correction:      BP Readings from Last 3 Encounters:  07/28/17 102/68  06/29/17 100/62  04/09/17 (!) 92/58   Wt Readings from Last 3 Encounters:  07/28/17 95 lb 3.2 oz (43.2 kg)  06/29/17 95 lb 4 oz (43.2 kg)  04/09/17 97 lb (44 kg)   Immunization History  Administered Date(s) Administered  . Influenza Split 01/31/2013, 01/04/2014, 01/04/2017  . Influenza,inj,Quad PF,6+ Mos 01/09/2015  . Influenza-Unspecified 01/05/2012, 02/02/2013  . Pneumococcal Conjugate-13 06/09/2013  . Pneumococcal Polysaccharide-23 04/06/2008, 05/21/2015  . Tdap 04/06/2006  . Zoster 04/07/2007   Health Maintenance  Topic Date Due  . TETANUS/TDAP  06/30/2018 (Originally 04/06/2016)  . INFLUENZA VACCINE  11/04/2017  . DEXA SCAN  Completed  . PNA vac Low Risk Adult  Completed   COPD: Status post recent follow-up with Dr. Jamal Collin.  No changes to therapy recommended.  Patient using Combivent 4 times daily.  Uses albuterol HFA as rescue inhaler.  Usually uses albuterol HFA 2 times per year.  Denies shortness of breath or smoking.  Exercising 2-3 days/week with walking.  Osteoporosis: Status post bilateral hip fractures.  Intolerant to Fosamax or other bisphosphonate therapy.  No previous Reclast therapy.  Patient refuses trial of Reclast has had a friend die on the table while receiving Reclast.  Patient is unaware of prior radiotherapy.  Patient reports compliance with 3 servings of dairy daily and calcium supplementation.  Also compliance with vitamin D supplementation.  Exercising 3-4 times daily.  Enjoys walking.  Refuses repeat bone density scan as will not change  medical therapy for her.  Hypothyroidism: Patient reports taking 30 mcg alternating with 45 mcg of Armour Thyroid.  Felt fatigued on 30 mcg of Armour Thyroid daily.  Due for repeat labs.   Review of Systems  Constitutional: Negative for activity change, appetite change, chills, diaphoresis, fatigue, fever and unexpected weight change.  HENT: Negative for congestion, dental problem, drooling, ear discharge, ear pain, facial swelling, hearing loss, mouth sores, nosebleeds, postnasal drip, rhinorrhea, sinus pressure, sneezing, sore throat, tinnitus, trouble swallowing and voice change.   Eyes: Negative for photophobia, pain, discharge, redness, itching and visual disturbance.  Respiratory: Negative for apnea, cough, choking, chest tightness, shortness of breath, wheezing and stridor.   Cardiovascular: Negative for chest pain, palpitations and leg swelling.  Gastrointestinal: Positive for abdominal pain. Negative for abdominal distention, anal bleeding, blood in stool, constipation, diarrhea, nausea, rectal pain and vomiting.  Endocrine: Negative for cold intolerance, heat intolerance, polydipsia, polyphagia and polyuria.  Genitourinary: Negative for decreased urine volume, difficulty urinating, dyspareunia, dysuria, enuresis, flank pain, frequency, genital sores, hematuria, menstrual problem, pelvic pain, urgency, vaginal bleeding, vaginal discharge and vaginal pain.       Nocturia x 1.  Rare urinary leakage.  Musculoskeletal: Negative for arthralgias, back pain, gait problem, joint swelling, myalgias, neck pain and neck stiffness.  Skin: Negative for color change, pallor, rash and wound.  Allergic/Immunologic: Negative for environmental allergies, food allergies and immunocompromised state.  Neurological: Negative for dizziness, tremors, seizures, syncope, facial asymmetry, speech difficulty, weakness, light-headedness, numbness and headaches.  Hematological: Negative for adenopathy. Does  not  bruise/bleed easily.  Psychiatric/Behavioral: Negative for agitation, behavioral problems, confusion, decreased concentration, dysphoric mood, hallucinations, self-injury, sleep disturbance and suicidal ideas. The patient is not nervous/anxious and is not hyperactive.        Bedtime 1000; wakes up 7-8.    Past Medical History:  Diagnosis Date  . Asthma   . Benign positional vertigo    s/p ENT consultation with Eply maneuvers 07-11-2012.  [place].  . Cataract   . Complication of anesthesia   . COPD (chronic obstructive pulmonary disease) ([place])   . Diarrhea   . Dyspnea    DOE  . GERD (gastroesophageal reflux disease)    has not bothered her for a while, especially when watching diet  . 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 Hypothyroidism   . MVP (mitral valve prolapse)   . Myocardial infarction ([place]) 07-12-95   when husband died  . Osteoporosis    R hip fracture; L hip fracture.  Intolerance to bisphosphonates; GI upset.  Took bisphosphonates x 10 years; last bisphosphonates 12-Jul-1998.  s/p L wrist fracture 12-Jul-1999.  Marland Kitchen Pancreas anomaly, congenital   . PONV (postoperative nausea and vomiting)   . Thyroid disease    Hypothyroidism   Past Surgical History:  Procedure Laterality Date  . APPENDECTOMY    . CATARACT EXTRACTION W/PHACO Right 07/15/2016   Procedure: CATARACT EXTRACTION PHACO AND INTRAOCULAR LENS PLACEMENT (IOC);  Surgeon: Estill Cotta, MD;  Location: [facility] ORS;  Service: Ophthalmology;  Laterality: Right;  [place] 3:03.8 AP% 25.8 CDE 92.59 FLUID PACK LOT # [id number]  . CATARACT EXTRACTION W/PHACO Left 10/14/2016   Procedure: CATARACT EXTRACTION PHACO AND INTRAOCULAR LENS PLACEMENT (IOC);  Surgeon: Estill Cotta, MD;  Location: [facility] ORS;  Service: Ophthalmology;  Laterality: Left;  [place] 01:34.5 AP% 26.6 CDE 43.55 Fluid pack lot # [id number]  . COLONOSCOPY     x 2; failure due to spasms.  [place].  Marland Kitchen ENT consult     +BPV; Eply maneuvers  wtih improvement.  [facility].  Marland Kitchen FRACTURE SURGERY     R hip fracture s/p ORIF; L hip fracture s/p ORIF.  Marland Kitchen LAPAROSCOPIC SALPINGO OOPHERECTOMY    . TONSILLECTOMY    . TUBAL LIGATION     Allergies  Allergen Reactions  . Sulfa Antibiotics Hives and Rash  . Biaxin [Clarithromycin] Other (See Comments)    Had a strong a reaction to it (can't remember specifically).Patiet thinks the dose was too strong for her body weight  . Gluten Meal     Stomach upset  . Lactose Intolerance (Gi) Nausea And Vomiting  . Penicillins Hives and Rash  . Sudafed [Pseudoephedrine Hcl]     Can't recall reaction   Current Outpatient Medications on File Prior to Visit  Medication Sig Dispense Refill  . ARMOUR THYROID 30 MG tablet TAKE ONE TABLET BY MOUTH DAILY 90 tablet 2  . Calcium Citrate-Vitamin D (CALCIUM CITRATE + D PO) Take 1 tablet by mouth daily.    . Ipratropium-Albuterol (COMBIVENT) 20-100 MCG/ACT AERS respimat Inhale 1 puff into the lungs every 6 (six) hours. 4 g 11  . Multiple Vitamin (MULTI-VITAMINS) TABS Take 1 tablet by mouth.      No current facility-administered medications on file prior to visit.    Social History   Socioeconomic History  . Marital status: Widowed    Spouse name: Not on file  . Number of children: 1  . Years of education: Not on  file  . Highest education level: Professional school degree (e.g., MD, DDS, DVM, JD)  Occupational History  . Occupation: retired  Scientific laboratory technician  . Financial resource strain: Not hard at all  . Food insecurity:    Worry: Never true    Inability: Never true  . Transportation needs:    Medical: No    Non-medical: No  Tobacco Use  . Smoking status: Passive Smoke Exposure - Never Smoker  . Smokeless tobacco: Never Used  . Tobacco comment: pt's father and husband both smoked in house.   Substance and Sexual Activity  . Alcohol use: No    Alcohol/week: 0.0 oz  . Drug use: No  . Sexual activity: Not Currently  Lifestyle  . Physical  activity:    Days per week: 7 days    Minutes per session: 60 min  . Stress: Not at all  Relationships  . Social connections:    Talks on phone: More than three times a week    Gets together: More than three times a week    Attends religious service: More than 4 times per year    Active member of club or organization: Yes    Attends meetings of clubs or organizations: More than 4 times per year    Relationship status: Widowed  . Intimate partner violence:    Fear of current or ex partner: No    Emotionally abused: No    Physically abused: No    Forced sexual activity: No  Other Topics Concern  . Not on file  Social History Narrative   Marital status: widowed x 1997 after 30+ years; not dating; not interested.       Children:  1 daughter; 2 granddaughters; 1 gg.      Lives:  With daughter [name]), son-in-law      Tobacco: never; husband smoked      Alcohol: none      Exercise:  Walking three to four days per week at [facility] for 30 minutes.      ADLs:  No assistant devices; has a cane for PRN use.  Drives; goes to grocery store; no cleaning; no bill paying; daughter pays bills.        Advanced Directives:  +LIVING WILL; +FULL CODE.  HCPOA: daughter(Shelley).       Activities: plays bridge two days per week at neighbors or home; has small church group on Wednesdays; goes to church on Sundays.    Family History  Problem Relation Age of Onset  . Cancer Sister        Terminal cancer in 2017; Breast cancer; endometrial cancer; vaginal cancer  . COPD Sister   . Breast cancer Sister   . Cancer Sister        Breast cancer  . Breast cancer Sister   . Cancer Mother 53       cervical cancer  . Asthma Mother   . COPD Father   . Cancer Sister        Melanoma       Objective:    BP 102/68   Pulse 78   Temp 97.8 F (36.6 C) (Oral)   Resp 16   Ht 5' 2.21" (1.58 m)   Wt 95 lb 3.2 oz (43.2 kg)   SpO2 97%   BMI 17.30 kg/m  Physical Exam  Constitutional: She is  oriented to person, place, and time. She appears well-developed and well-nourished. No distress.  HENT:  Head: Normocephalic and atraumatic.  Right Ear: Hearing, tympanic membrane, external ear and ear canal normal.  Left Ear: Hearing, tympanic membrane, external ear and ear canal normal.  Nose: Nose normal.  Mouth/Throat: Oropharynx is clear and moist.  Eyes: Pupils are equal, round, and reactive to light. Conjunctivae and EOM are normal.  Neck: Normal range of motion and full passive range of motion without pain. Neck supple. No JVD present. Carotid bruit is not present. No thyromegaly present.  Cardiovascular: Normal rate, regular rhythm, normal heart sounds and intact distal pulses. Exam reveals no gallop and no friction rub.  No murmur heard. Pulmonary/Chest: Effort normal and breath sounds normal. No respiratory distress. She has no wheezes. She has no rales. Right breast exhibits no inverted nipple, no mass, no nipple discharge, no skin change and no tenderness. Left breast exhibits no inverted nipple, no mass, no nipple discharge, no skin change and no tenderness. Breasts are symmetrical.  Abdominal: Soft. Bowel sounds are normal. She exhibits no distension and no mass. There is no tenderness. There is no rebound and no guarding.  Musculoskeletal:       Right shoulder: Normal.       Left shoulder: Normal.       Cervical back: Normal.  Lymphadenopathy:    She has no cervical adenopathy.  Neurological: She is alert and oriented to person, place, and time. She has normal reflexes. No cranial nerve deficit. She exhibits normal muscle tone. Coordination normal.  Skin: Skin is warm and dry. No rash noted. She is not diaphoretic. No erythema. No pallor.  Psychiatric: She has a normal mood and affect. Her behavior is normal. Judgment and thought content normal.  Nursing note and vitals reviewed.  No results found. Depression screen [facility] 2/9 07/28/2017 06/29/2017 01/16/2017 05/20/2016 05/20/2016    Decreased Interest 0 0 0 0 0  Down, Depressed, Hopeless 0 0 0 0 0  PHQ - 2 Score 0 0 0 0 0   Fall Risk  07/28/2017 06/29/2017 01/16/2017 05/20/2016 05/20/2016  Falls in the past year? No No No No No    Functional Status Survey: Is the patient deaf or have difficulty hearing?: No Does the patient have difficulty seeing, even when wearing glasses/contacts?: No Does the patient have difficulty concentrating, remembering, or making decisions?: No Does the patient have difficulty walking or climbing stairs?: No Does the patient have difficulty dressing or bathing?: No Does the patient have difficulty doing errands alone such as visiting a doctor's office or shopping?: No     Assessment & Plan:   1. Routine physical examination   2. Panlobular emphysema ([place])   3. Gastroesophageal reflux disease without esophagitis   4. Hypothyroidism due to acquired atrophy of thyroid   5. Age-related osteoporosis without current pathological fracture   6. Underweight   7. Pure hypercholesterolemia     -anticipatory guidance provided --- exercise, weight loss, safe driving practices, aspirin 81mg  daily. -obtain age appropriate screening labs and labs for chronic disease management. -moderate fall risk; no evidence of depression; no evidence of hearing loss.  Discussed advanced directives and living will; also discussed end of life issues including code status.  Hypothyroidism well controlled.  Obtain labs for chronic disease management.  Refills provided. COPD: Well-controlled on Combivent therapy.  Advised patient she does not need albuterol for rescue inhaler.  Only using once or twice per year.  She could use an additional dose of Combivent as necessary dose times 8 years. Osteoporosis: Uncontrolled at this time due to refusal of bisphosphonate therapy.  Patient willing to try Reclast.  Information on Prolia therapy.  Patient will review and discuss with her daughter who is a Marine scientist. Continue with calcium  and vitamin D supplementation.  Continue with daily exercise.   Orders Placed This Encounter  Procedures  . CBC with Differential/Platelet  . Comprehensive metabolic panel    Order Specific Question:   Has the patient fasted?    Answer:   No  . TSH  . T4, free  . Lipid panel    Order Specific Question:   Has the patient fasted?    Answer:   No  . VITAMIN D 25 Hydroxy (Vit-D Deficiency, Fractures)  . POCT urinalysis dipstick   Meds ordered this encounter  Medications  . Zoster Vaccine Adjuvanted [facility]) injection    Sig: Inject 0.5 mLs into the muscle once for 1 dose.    Dispense:  0.5 mL    Refill:  1  . thyroid (ARMOUR THYROID) 15 MG tablet    Sig: Take 1 tablet (15 mg total) by mouth every other day.    Dispense:  90 tablet    Refill:  1    Return in about 1 year (around 07/29/2018) for complete physical examiniation.    Elayne Guerin, M.D. Primary Care at [facility] previously [facility] [address] [place], [place]  [address] [phone] phone [phone] fax

## 2017-07-28 NOTE — Patient Instructions (Addendum)
[facility] IN [place] -- STARTING IN [place].  Provider in [place]: Deborra Medina at [facility] Dr. Brita Romp at [facility] Dr. Parks Ranger at [place].  IF you received an x-ray today, you will receive an invoice from [facility]. Please contact [facility] at [phone] with questions or concerns regarding your invoice.   IF you received labwork today, you will receive an invoice from [facility]. Please contact [facility] at [phone] with questions or concerns regarding your invoice.   Our billing staff will not be able to assist you with questions regarding bills from these companies.  You will be contacted with the lab results as soon as they are available. The fastest way to get your results is to activate your My Chart account. Instructions are located on the last page of this paperwork. If you have not heard from [place] regarding the results in 2 weeks, please contact this office.     Denosumab injection What is this medicine? DENOSUMAB (den oh sue mab) slows bone breakdown. Prolia is used to treat osteoporosis in women after menopause and in men. Delton See is used to treat a high calcium level due to cancer and to prevent bone fractures and other bone problems caused by multiple myeloma or cancer bone metastases. Delton See is also used to treat giant cell tumor of the bone. This medicine may be used for other purposes; ask your health care provider or pharmacist if you have questions. COMMON BRAND NAME(S): Prolia, XGEVA What should I tell my health care provider before I take this medicine? They need to know if you have any of these conditions: -dental disease -having surgery or tooth extraction -infection -kidney disease -low levels of calcium or Vitamin D in the blood -malnutrition -on hemodialysis -skin conditions or sensitivity -thyroid or parathyroid disease -an unusual reaction to denosumab, other medicines, foods,  dyes, or preservatives -pregnant or trying to get pregnant -breast-feeding How should I use this medicine? This medicine is for injection under the skin. It is given by a health care professional in a hospital or clinic setting. If you are getting Prolia, a special MedGuide will be given to you by the pharmacist with each prescription and refill. Be sure to read this information carefully each time. For Prolia, talk to your pediatrician regarding the use of this medicine in children. Special care may be needed. For Delton See, talk to your pediatrician regarding the use of this medicine in children. While this drug may be prescribed for children as young as 13 years for selected conditions, precautions do apply. Overdosage: If you think you have taken too much of this medicine contact a poison control center or emergency room at once. NOTE: This medicine is only for you. Do not share this medicine with others. What if I miss a dose? It is important not to miss your dose. Call your doctor or health care professional if you are unable to keep an appointment. What may interact with this medicine? Do not take this medicine with any of the following medications: -other medicines containing denosumab This medicine may also interact with the following medications: -medicines that lower your chance of fighting infection -steroid medicines like prednisone or cortisone This list may not describe all possible interactions. Give your health care provider a list of all the medicines, herbs, non-prescription drugs, or dietary supplements you use. Also tell them if you smoke, drink alcohol, or use illegal drugs. Some items may interact with your medicine. What should I watch for while  using this medicine? Visit your doctor or health care professional for regular checks on your progress. Your doctor or health care professional may order blood tests and other tests to see how you are doing. Call your doctor or health  care professional for advice if you get a fever, chills or sore throat, or other symptoms of a cold or flu. Do not treat yourself. This drug may decrease your body's ability to fight infection. Try to avoid being around people who are sick. You should make sure you get enough calcium and vitamin D while you are taking this medicine, unless your doctor tells you not to. Discuss the foods you eat and the vitamins you take with your health care professional. See your dentist regularly. Brush and floss your teeth as directed. Before you have any dental work done, tell your dentist you are receiving this medicine. Do not become pregnant while taking this medicine or for 5 months after stopping it. Talk with your doctor or health care professional about your birth control options while taking this medicine. Women should inform their doctor if they wish to become pregnant or think they might be pregnant. There is a potential for serious side effects to an unborn child. Talk to your health care professional or pharmacist for more information. What side effects may I notice from receiving this medicine? Side effects that you should report to your doctor or health care professional as soon as possible: -allergic reactions like skin rash, itching or hives, swelling of the face, lips, or tongue -bone pain -breathing problems -dizziness -jaw pain, especially after dental work -redness, blistering, peeling of the skin -signs and symptoms of infection like fever or chills; cough; sore throat; pain or trouble passing urine -signs of low calcium like fast heartbeat, muscle cramps or muscle pain; pain, tingling, numbness in the hands or feet; seizures -unusual bleeding or bruising -unusually weak or tired Side effects that usually do not require medical attention (report to your doctor or health care professional if they continue or are bothersome): -constipation -diarrhea -headache -joint pain -loss of  appetite -muscle pain -runny nose -tiredness -upset stomach This list may not describe all possible side effects. Call your doctor for medical advice about side effects. You may report side effects to FDA at 1-800-FDA-1088. Where should I keep my medicine? This medicine is only given in a clinic, doctor's office, or other health care setting and will not be stored at home. NOTE: This sheet is a summary. It may not cover all possible information. If you have questions about this medicine, talk to your doctor, pharmacist, or health care provider.  2018 Elsevier/Gold Standard (2016-04-14 19:17:21)

## 2017-07-29 LAB — LIPID PANEL
Chol/HDL Ratio: 2.3 ratio (ref 0.0–4.4)
Cholesterol, Total: 241 mg/dL — ABNORMAL HIGH (ref 100–199)
HDL: 104 mg/dL (ref >39)
LDL Calculated: 118 mg/dL — ABNORMAL HIGH (ref 0–99)
TRIGLYCERIDES: 94 mg/dL (ref 0–149)
VLDL Cholesterol Cal: 19 mg/dL (ref 5–40)

## 2017-07-29 LAB — COMPREHENSIVE METABOLIC PANEL
ALK PHOS: 65 IU/L (ref 39–117)
ALT: 12 IU/L (ref 0–32)
AST: 24 IU/L (ref 0–40)
Albumin/Globulin Ratio: 1.7 (ref 1.2–2.2)
Albumin: 4.5 g/dL (ref 3.5–4.7)
BILIRUBIN TOTAL: 0.4 mg/dL (ref 0.0–1.2)
BUN / CREAT RATIO: 24 (ref 12–28)
BUN: 16 mg/dL (ref 8–27)
CHLORIDE: 102 mmol/L (ref 96–106)
CO2: 25 mmol/L (ref 20–29)
Calcium: 10.1 mg/dL (ref 8.7–10.3)
Creatinine, Ser: 0.66 mg/dL (ref 0.57–1.00)
GFR calc Af Amer: 96 mL/min/{1.73_m2} (ref >59)
GFR calc non Af Amer: 84 mL/min/{1.73_m2} (ref >59)
GLUCOSE: 91 mg/dL (ref 65–99)
Globulin, Total: 2.7 g/dL (ref 1.5–4.5)
Potassium: 4.1 mmol/L (ref 3.5–5.2)
SODIUM: 143 mmol/L (ref 134–144)
Total Protein: 7.2 g/dL (ref 6.0–8.5)

## 2017-07-29 LAB — CBC WITH DIFFERENTIAL/PLATELET
BASOS ABS: 0 10*3/uL (ref 0.0–0.2)
Basos: 1 %
EOS (ABSOLUTE): 0.2 10*3/uL (ref 0.0–0.4)
Eos: 3 %
Hematocrit: 40.2 % (ref 34.0–46.6)
Hemoglobin: 13.7 g/dL (ref 11.1–15.9)
Immature Grans (Abs): 0 10*3/uL (ref 0.0–0.1)
Immature Granulocytes: 0 %
Lymphocytes Absolute: 1.9 10*3/uL (ref 0.7–3.1)
Lymphs: 32 %
MCH: 27.8 pg (ref 26.6–33.0)
MCHC: 34.1 g/dL (ref 31.5–35.7)
MCV: 82 fL (ref 79–97)
MONOS ABS: 0.5 10*3/uL (ref 0.1–0.9)
Monocytes: 8 %
NEUTROS ABS: 3.4 10*3/uL (ref 1.4–7.0)
Neutrophils: 56 %
Platelets: 298 10*3/uL (ref 150–379)
RBC: 4.93 x10E6/uL (ref 3.77–5.28)
RDW: 14.2 % (ref 12.3–15.4)
WBC: 6 10*3/uL (ref 3.4–10.8)

## 2017-07-29 LAB — TSH: TSH: 0.369 u[IU]/mL — AB (ref 0.450–4.500)

## 2017-07-29 LAB — VITAMIN D 25 HYDROXY (VIT D DEFICIENCY, FRACTURES): VIT D 25 HYDROXY: 78.1 ng/mL (ref 30.0–100.0)

## 2017-07-29 LAB — T4, FREE: FREE T4: 0.93 ng/dL (ref 0.82–1.77)

## 2017-09-01 ENCOUNTER — Encounter: Payer: Self-pay | Admitting: Family Medicine

## 2017-10-21 IMAGING — US US ABDOMEN COMPLETE
1 series · 13 of 25 positions shown · non-contrast
Comparison: Abdominal and pelvic CT scan January 11, 2015

CLINICAL DATA: Right upper quadrant abdominal pain, postprandial
bloating for the past 6 weeks

EXAM:
ABDOMEN ULTRASOUND COMPLETE

[Series 1: us abdomen complete · 0.12mm/px · 13 of 113 slices shown]
[im 1/113]
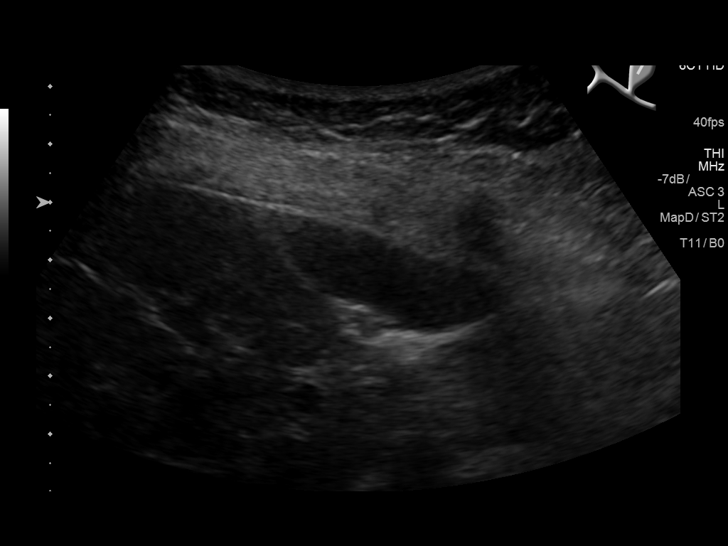
[im 10/113]
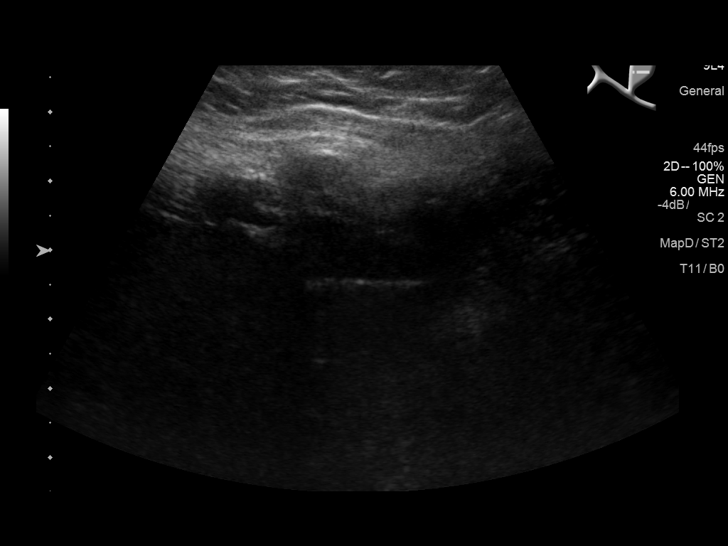
[im 19/113]
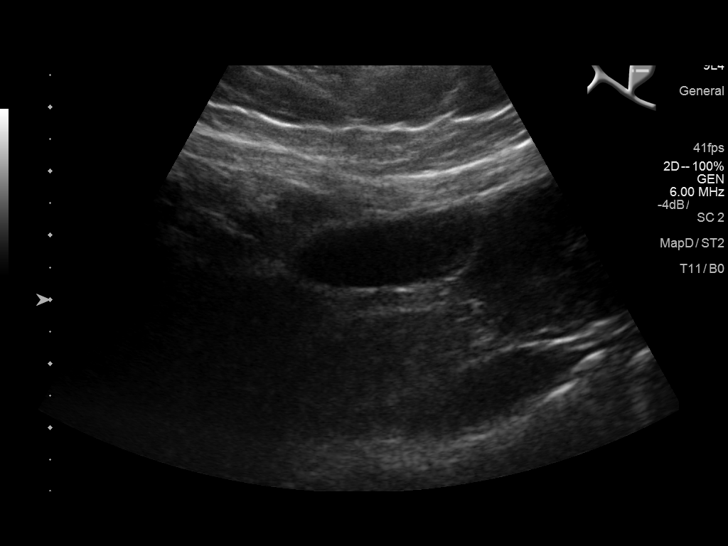
[im 29/113]
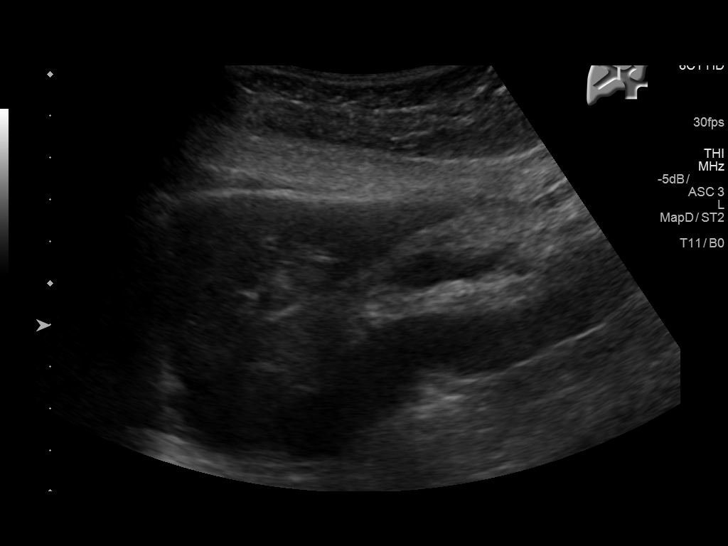
[im 38/113]
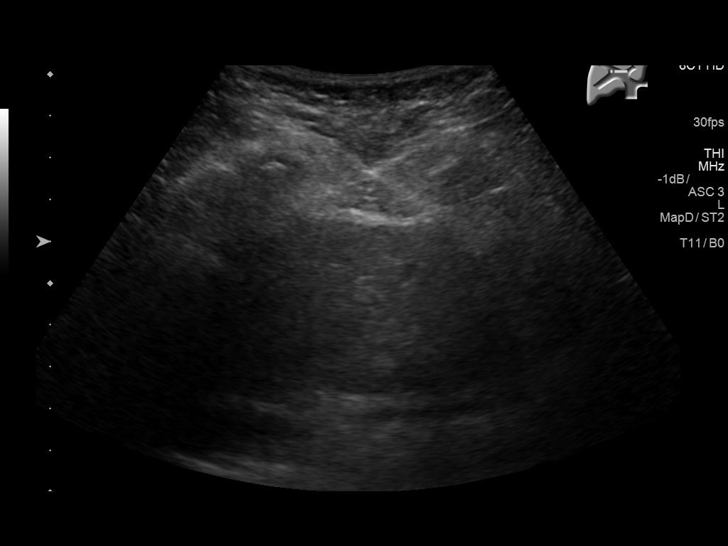
[im 47/113]
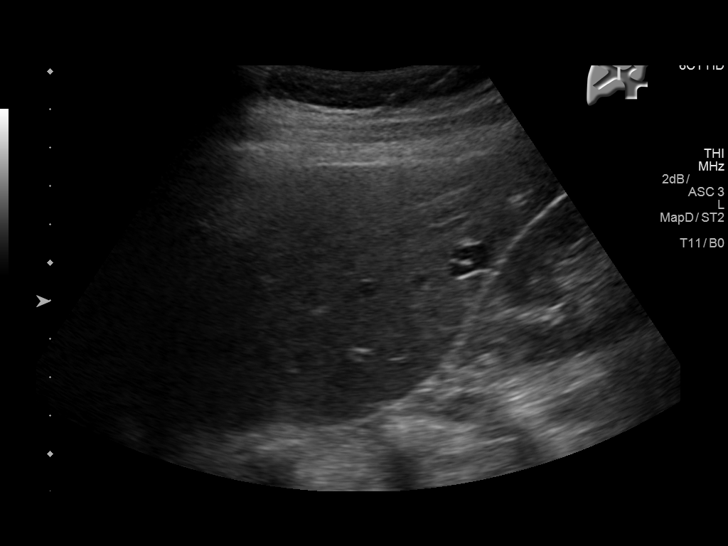
[im 57/113]
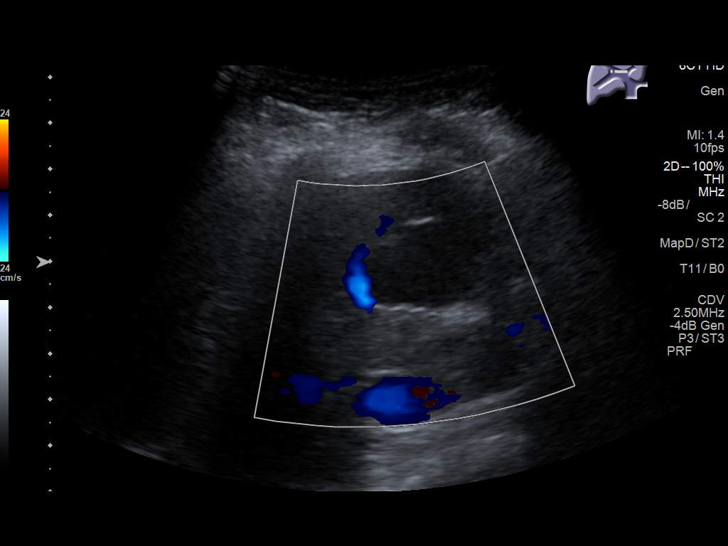
[im 66/113]
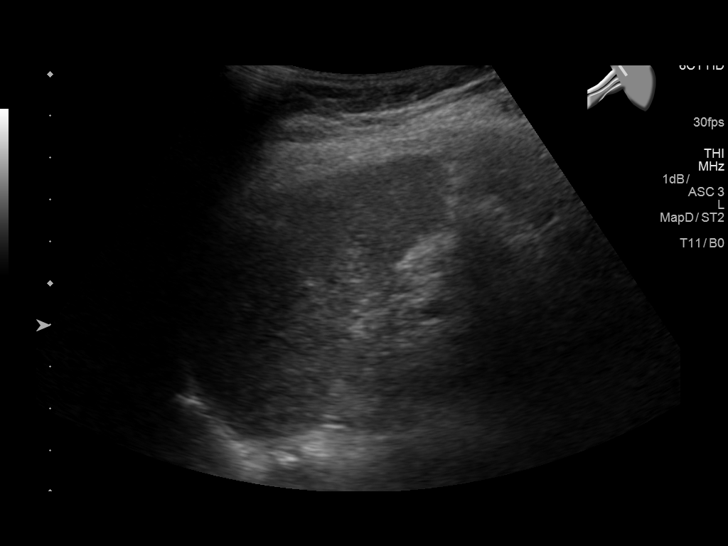
[im 75/113]
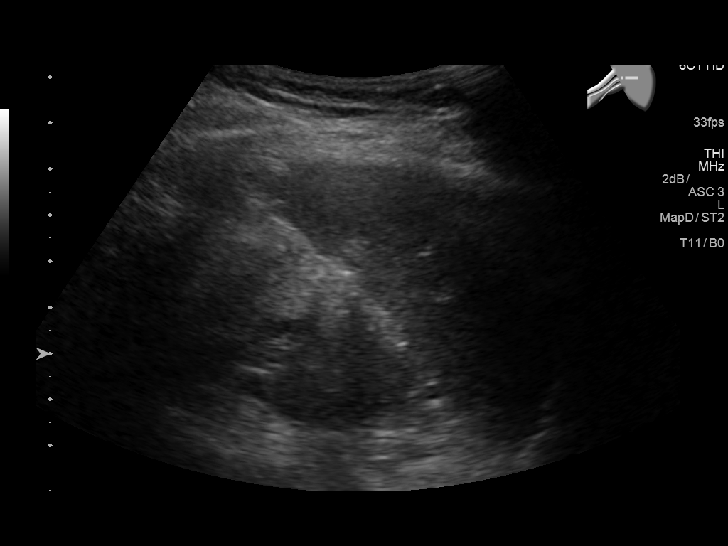
[im 85/113]
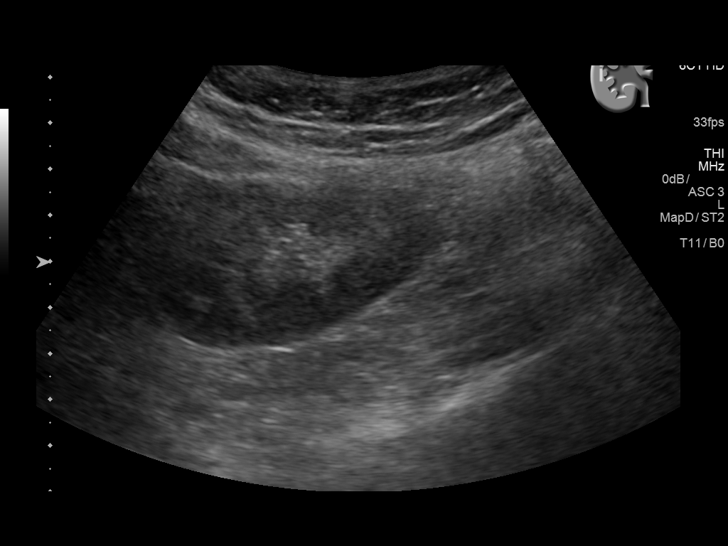
[im 94/113]
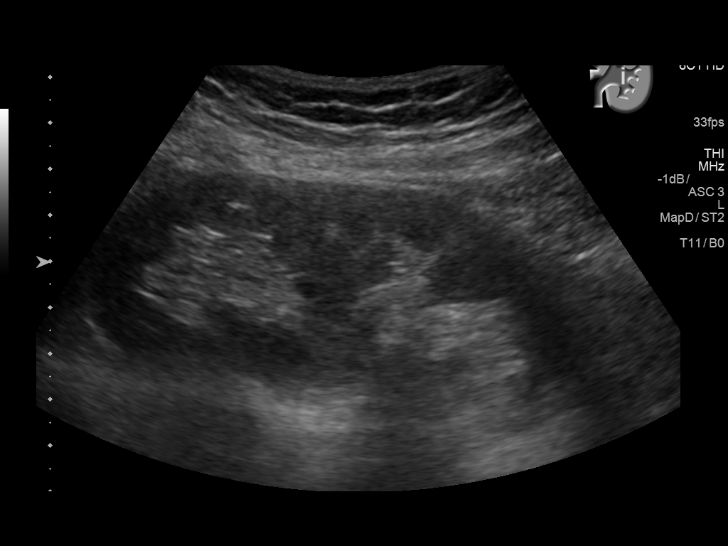
[im 103/113]
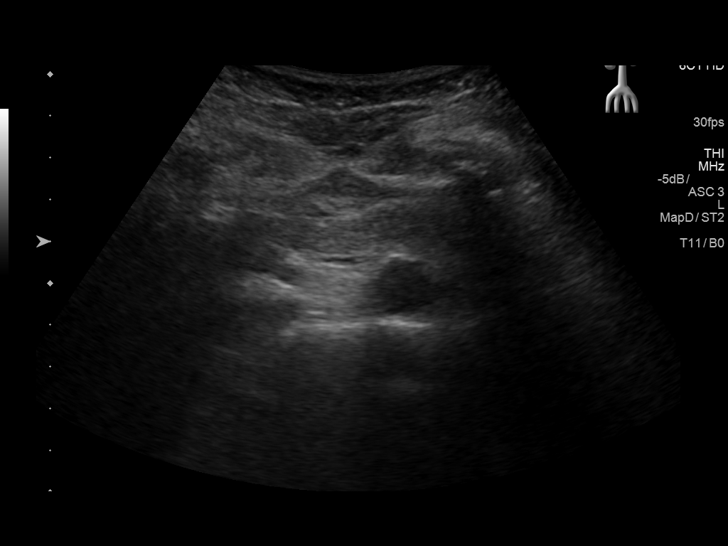
[im 113/113]
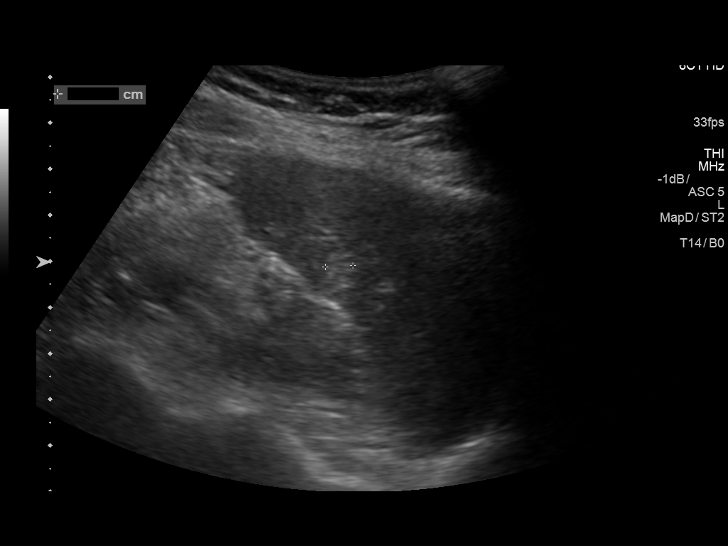

[13 of 25 positions shown; findings below may reference images not displayed]

FINDINGS: Gallbladder: No gallstones or wall thickening visualized. No
sonographic Murphy sign noted by sonographer.

Common bile duct: Diameter: 6.3 mm

Liver: The hepatic echotexture is normal. There are multiple cysts
which were demonstrated on the recent CT scan. The largest measures
2.7 x 1.9 x 3.2 cm and lies in the right lobe adjacent to the
gallbladder. There is no intrahepatic ductal dilation.

IVC: No abnormality visualized.

Pancreas: Bowel gas obscures the pancreatic body and tail. The
pancreatic head is observed and is grossly normal

Spleen: Normal in size. There is a hyperechoic focus measuring 1 x 1
x 0.6 cm.

Right Kidney: Length: 8.1 cm. Echogenicity within normal limits. No
mass or hydronephrosis visualized.

Left Kidney: Length: 10.5 cm.. There is no hydronephrosis. There may
be a duplicated collecting system.

Abdominal aorta: No aneurysm visualized.

Other findings: None.
IMPRESSION: 1. Multiple hepatic cysts which have been previously demonstrated.
No suspicious appearing hepatic masses.
2. Normal appearance of the gallbladder. If there are clinical
concerns of chronic cholecystitis, a nuclear medicine hepatobiliary
scan with gallbladder ejection fraction determination may be useful.
3. The right kidney is smaller than the left but its echotexture is
normal. There is no hydronephrosis.

## 2017-11-02 ENCOUNTER — Other Ambulatory Visit: Payer: Self-pay | Admitting: Family Medicine

## 2017-11-02 DIAGNOSIS — Z1231 Encounter for screening mammogram for malignant neoplasm of breast: Secondary | ICD-10-CM

## 2017-11-03 ENCOUNTER — Encounter: Payer: Self-pay | Admitting: Family Medicine

## 2017-11-19 ENCOUNTER — Ambulatory Visit
Admission: RE | Admit: 2017-11-19 | Discharge: 2017-11-19 | Disposition: A | Discharge status: Home / Self Care | Payer: Medicare Other | Source: Ambulatory Visit | Attending: Family Medicine | Admitting: Family Medicine

## 2017-11-19 DIAGNOSIS — Z1231 Encounter for screening mammogram for malignant neoplasm of breast: Secondary | ICD-10-CM

## 2018-02-01 ENCOUNTER — Telehealth: Payer: Self-pay | Admitting: Pulmonary Disease

## 2018-02-01 NOTE — Telephone Encounter (Signed)
Spoke to patient, she states she cannot go without her combivent inhaler, and she cannot go without her inhaler this time of year. Suggested that patient could either buy one out of pocket, or come in to be seen and see if there is another medication that would control her better. She questioned just using her rescue inhaler. Let her know that she could use that every 4 hours as needed. She will continue with that and call us if needed.

## 2018-02-01 NOTE — Telephone Encounter (Signed)
LM for patient to call back regarding inhaler question.

## 2018-02-01 NOTE — Telephone Encounter (Signed)
Patient calling  States that Combivent inhaler will be running out on the [place] pharmacy will not let her pick up new inhaler til Nov 1 at 3:30p Patient is already having trouble breathing and does not feel comfortable going 2 days without  Please call to discuss

## 2018-05-31 ENCOUNTER — Other Ambulatory Visit: Payer: Self-pay

## 2018-05-31 DIAGNOSIS — J431 Panlobular emphysema: Secondary | ICD-10-CM

## 2018-05-31 MED ORDER — IPRATROPIUM-ALBUTEROL 20-100 MCG/ACT IN AERS: 1.0000 | INHALATION_SPRAY | Freq: Four times a day (QID) | RESPIRATORY_TRACT | 11 refills | Status: DC

## 2018-05-31 NOTE — Progress Notes (Signed)
Received fax from [facility] requesting refill for combivent. Refill sent.

## 2019-01-23 ENCOUNTER — Emergency Department
Admission: EM | Admit: 2019-01-23 | Discharge: 2019-01-23 | Disposition: A | Discharge status: Home / Self Care | Payer: Medicare Other | Attending: Student | Admitting: Student

## 2019-01-23 ENCOUNTER — Encounter: Payer: Self-pay | Admitting: Intensive Care

## 2019-01-23 ENCOUNTER — Other Ambulatory Visit: Payer: Self-pay

## 2019-01-23 ENCOUNTER — Emergency Department: Payer: Medicare Other

## 2019-01-23 DIAGNOSIS — R0602 Shortness of breath: Secondary | ICD-10-CM | POA: Diagnosis present

## 2019-01-23 DIAGNOSIS — Z20828 Contact with and (suspected) exposure to other viral communicable diseases: Secondary | ICD-10-CM | POA: Diagnosis not present

## 2019-01-23 DIAGNOSIS — I252 Old myocardial infarction: Secondary | ICD-10-CM | POA: Insufficient documentation

## 2019-01-23 DIAGNOSIS — E039 Hypothyroidism, unspecified: Secondary | ICD-10-CM | POA: Insufficient documentation

## 2019-01-23 DIAGNOSIS — J449 Chronic obstructive pulmonary disease, unspecified: Secondary | ICD-10-CM | POA: Insufficient documentation

## 2019-01-23 DIAGNOSIS — Z7722 Contact with and (suspected) exposure to environmental tobacco smoke (acute) (chronic): Secondary | ICD-10-CM | POA: Insufficient documentation

## 2019-01-23 DIAGNOSIS — Z79899 Other long term (current) drug therapy: Secondary | ICD-10-CM | POA: Diagnosis not present

## 2019-01-23 LAB — CBC
HCT: 41.4 % (ref 36.0–46.0)
Hemoglobin: 13.1 g/dL (ref 12.0–15.0)
MCH: 26.7 pg (ref 26.0–34.0)
MCHC: 31.6 g/dL (ref 30.0–36.0)
MCV: 84.5 fL (ref 80.0–100.0)
Platelets: 319 10*3/uL (ref 150–400)
RBC: 4.9 MIL/uL (ref 3.87–5.11)
RDW: 13.2 % (ref 11.5–15.5)
WBC: 7.7 10*3/uL (ref 4.0–10.5)
nRBC: 0 % (ref 0.0–0.2)

## 2019-01-23 LAB — BASIC METABOLIC PANEL
Anion gap: 12 (ref 5–15)
BUN: 28 mg/dL — ABNORMAL HIGH (ref 8–23)
CO2: 24 mmol/L (ref 22–32)
Calcium: 9.8 mg/dL (ref 8.9–10.3)
Chloride: 104 mmol/L (ref 98–111)
Creatinine, Ser: 0.63 mg/dL (ref 0.44–1.00)
GFR calc Af Amer: >60 mL/min (ref >60)
GFR calc non Af Amer: >60 mL/min (ref >60)
Glucose, Bld: 102 mg/dL — ABNORMAL HIGH (ref 70–99)
Potassium: 3.9 mmol/L (ref 3.5–5.1)
Sodium: 140 mmol/L (ref 135–145)

## 2019-01-23 LAB — TROPONIN I (HIGH SENSITIVITY)
Troponin I (High Sensitivity): 3 ng/L (ref <18)
Troponin I (High Sensitivity): 4 ng/L (ref <18)

## 2019-01-23 MED ORDER — ALBUTEROL SULFATE HFA 108 (90 BASE) MCG/ACT IN AERS: 2.0000 | INHALATION_SPRAY | Freq: Four times a day (QID) | RESPIRATORY_TRACT | 0 refills | Status: DC | PRN

## 2019-01-23 MED ORDER — IPRATROPIUM-ALBUTEROL 0.5-2.5 (3) MG/3ML IN SOLN
3.0000 mL | Freq: Once | RESPIRATORY_TRACT | Status: AC
  Administered 2019-01-23: 18:00:00 3 mL via RESPIRATORY_TRACT
  Filled 2019-01-23: qty 3

## 2019-01-23 NOTE — ED Provider Notes (Signed)
[facility] Emergency Department Provider Note  ____________________________________________   First MD Initiated Contact with Patient 01/23/19 1651     (approximate)  I have reviewed the triage vital signs and the nursing notes.  History  Chief Complaint Shortness of Breath    HPI Shelia Fernandez is a 84 y.o. female with a history of CAD, hypothyroidism, asthma who presents to the emergency department for shortness of breath.  Patient reports some increasing shortness of breath since this morning.  She used her albuterol twice at home with good improvement.  She initially sought care at the [facility] clinic.  When she first walked in and arrived to the clinic her oxygen saturations were reportedly 87%, but rapidly increased at 97% with a few deep breaths.  They did an ambulatory saturation in which she desaturated to the 80s and therefore recommended evaluation in the emergency department.  On arrival to the emergency department she reports feeling minimally short of breath in the bed.  Her oxygen saturation is 97% on room air.  She does not wear any supplemental oxygen at baseline.  She denies any cough.  She thinks her symptoms may be related to an asthma exacerbation, which typically worsens when she has a flare of her allergies which she is currently experiencing.  She denies any fevers, nausea, vomiting, leg swelling.  She denies any history of DVT or PE.  She denies any sick contacts, however her daughter states one of the people that she plays bridge with was recently sick with pneumonia, but unsure if they were tested for COVID.   Past Medical Hx Past Medical History:  Diagnosis Date  . Asthma   . Benign positional vertigo    s/p ENT consultation with Eply maneuvers 07/28/2012.  [facility].  . Cataract   . Complication of anesthesia   . COPD (chronic obstructive pulmonary disease) ([place])   . Diarrhea   . Dyspnea    DOE  . GERD (gastroesophageal reflux  disease)    has not bothered her for a while, especially when watching diet  . 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 Hypothyroidism   . MVP (mitral valve prolapse)   . Myocardial infarction ([place]) 07/29/95   when husband died  . Osteoporosis    R hip fracture; L hip fracture.  Intolerance to bisphosphonates; GI upset.  Took bisphosphonates x 10 years; last bisphosphonates 07-29-1998.  s/p L wrist fracture 1999-07-29.  Marland Kitchen Pancreas anomaly, congenital   . PONV (postoperative nausea and vomiting)   . Thyroid disease    Hypothyroidism    Problem List Patient Active Problem List   Diagnosis Date Noted  . Underweight 05/14/2014  . Osteoporosis 04/24/2013  . COPD (chronic obstructive pulmonary disease) ([place]) 04/24/2013  . Hypothyroidism 04/24/2013  . Esophageal reflux 04/24/2013    Past Surgical Hx Past Surgical History:  Procedure Laterality Date  . APPENDECTOMY    . CATARACT EXTRACTION W/PHACO Right 07/15/2016   Procedure: CATARACT EXTRACTION PHACO AND INTRAOCULAR LENS PLACEMENT (IOC);  Surgeon: Estill Cotta, MD;  Location: [facility] ORS;  Service: Ophthalmology;  Laterality: Right;  [place] 3:03.8 AP% 25.8 CDE 92.59 FLUID PACK LOT # [id number]  . CATARACT EXTRACTION W/PHACO Left 10/14/2016   Procedure: CATARACT EXTRACTION PHACO AND INTRAOCULAR LENS PLACEMENT (IOC);  Surgeon: Estill Cotta, MD;  Location: [facility] ORS;  Service: Ophthalmology;  Laterality: Left;  [place] 01:34.5 AP% 26.6 CDE 43.55 Fluid pack lot # [id number]  .  COLONOSCOPY     x 2; failure due to spasms.  [facility].  Marland Kitchen ENT consult     +BPV; Eply maneuvers wtih improvement.  [facility]/ENT.  Marland Kitchen FRACTURE SURGERY     R hip fracture s/p ORIF; L hip fracture s/p ORIF.  Marland Kitchen LAPAROSCOPIC SALPINGO OOPHERECTOMY    . TONSILLECTOMY    . TUBAL LIGATION      Medications Prior to Admission medications   Medication Sig Start Date End Date Taking? Authorizing Provider  ARMOUR THYROID 30 MG tablet TAKE ONE  TABLET BY MOUTH DAILY 06/25/17   Wardell Honour, MD  Calcium Citrate-Vitamin D (CALCIUM CITRATE + D PO) Take 1 tablet by mouth daily.    [provider]  Ipratropium-Albuterol (COMBIVENT) 20-100 MCG/ACT AERS respimat Inhale 1 puff into the lungs every 6 (six) hours. 05/31/18   Wilhelmina Mcardle, MD  Multiple Vitamin (MULTI-VITAMINS) TABS Take 1 tablet by mouth.  05/16/10   [provider]  thyroid (ARMOUR THYROID) 15 MG tablet Take 1 tablet (15 mg total) by mouth every other day. 07/28/17   Wardell Honour, MD    Allergies Sulfa antibiotics, Biaxin [clarithromycin], Gluten meal, Lactose intolerance (gi), Penicillins, and Sudafed [pseudoephedrine hcl]  Family Hx Family History  Problem Relation Age of Onset  . Cancer Sister        Terminal cancer in 2017; Breast cancer; endometrial cancer; vaginal cancer  . COPD Sister   . Breast cancer Sister   . Cancer Sister        Breast cancer  . Breast cancer Sister   . Cancer Mother 55       cervical cancer  . Asthma Mother   . COPD Father   . Cancer Sister        Melanoma    Social Hx Social History   Tobacco Use  . Smoking status: Passive Smoke Exposure - Never Smoker  . Smokeless tobacco: Never Used  . Tobacco comment: pt's father and husband both smoked in house.   Substance Use Topics  . Alcohol use: No    Alcohol/week: 0.0 standard drinks  . Drug use: No     Review of Systems  Constitutional: Negative for fever, chills. Eyes: Negative for visual changes. ENT: Negative for sore throat. Cardiovascular: Negative for chest pain. Respiratory: + for shortness of breath. Gastrointestinal: Negative for nausea, vomiting.  Genitourinary: Negative for dysuria. Musculoskeletal: Negative for leg swelling. Skin: Negative for rash. Neurological: Negative for for headaches.   Physical Exam  Vital Signs: ED Triage Vitals  Enc Vitals Group     BP 01/23/19 1604 (!) 122/46     Pulse Rate 01/23/19 1604 82     Resp  01/23/19 1604 18     Temp 01/23/19 1605 97.8 F (36.6 C)     Temp Source 01/23/19 1605 Oral     SpO2 01/23/19 1605 98 %     Weight 01/23/19 1600 95 lb (43.1 kg)     Height 01/23/19 1600 5\' 1"  (1.549 m)     Head Circumference --      Peak Flow --      Pain Score 01/23/19 1600 0     Pain Loc --      Pain Edu? --      Excl. in [place]? --     Constitutional: Alert and oriented.  Head: Normocephalic. Atraumatic. Eyes: Conjunctivae clear. Sclera anicteric. Nose: No congestion. No rhinorrhea. Mouth/Throat: Mucous membranes are moist.  Neck: No stridor.  Cardiovascular: Normal rate, regular rhythm. Extremities well perfused. Respiratory: Normal respiratory effort.  Lungs CTAB.  Satting 97% on room air.  Speaking in full sentences.  No tachypnea or accessory muscle use. Gastrointestinal: Soft. Non-tender. Non-distended.  Musculoskeletal: No lower extremity edema. No deformities. Neurologic:  Normal speech and language. No gross focal neurologic deficits are appreciated.  Skin: Skin is warm, dry and intact. No rash noted. Psychiatric: Mood and affect are appropriate for situation.  EKG  Personally reviewed.   Rate: 81 Rhythm: sinus Axis: normal Intervals: WNL No acute ischemic changes No STEMI    Radiology  XR: IMPRESSION:  Hyperinflated lungs with emphysematous disease and scarring in the  right upper lobe. No acute airspace disease.    Procedures  Procedure(s) performed (including critical care):  Procedures   Initial Impression / Assessment and Plan / ED Course  84 y.o. female with a history of asthma who presents to the ED for shortness of breath, reportedly hypoxic in the clinic.  On arrival to the emergency department she is in no respiratory distress and her oxygen is 97% on room air.  Ddx: asthma exacerbation, pulmonary infection, COVID.  Doubt PE, as she has not had any recent prolonged travel or immobilization, no leg swelling, no hemoptysis, no history of  VTE.  Plan: labs, EKG, XR  Labs without actual derangements.  EKG without acute ischemic changes and high-sensitivity troponin is negative.  X-ray with hyperinflated lungs, consistent with her known pulmonary disease associated with scarring, which she reports is chronic and she is aware of.  Given she appears to be in no acute respiratory distress here with no evidence of hypoxia.  Will administer a nebulizer treatment as she said that this helped her symptomatically at home and perform an ambulatory walk test here.  If passes, will plan for discharge with scheduled nebulizer treatments at home for the next several days.  Recommended a short burst of steroids as well, however patient adamantly declines this as she states when she has previously been on prednisone it made her too hyperactive.  Will advise a daily antiallergy medication as well since this may have precipitated her asthma exacerbation.  Discussed this plan of care with the patient, as well as the daughter who is an Therapist, sports, both are agreeable with the plan.  Patient has ambulated with steady gait, oxygen 94-97% on RA and asymptomatic. As such, will proceed with discharge as above.   Final Clinical Impression(s) / ED Diagnosis  Final diagnoses:  SOB (shortness of breath)       Note:  This document was prepared using Dragon voice recognition software and may include unintentional dictation errors.   Lilia Pro., MD 01/24/19 [phone]

## 2019-01-23 NOTE — ED Notes (Signed)
Pt walked hallways with this RN; pt used cane; pt very steady; remained 94-97% while walking on RA. Pt denied dizziness, nausea, or SOB.

## 2019-01-23 NOTE — ED Triage Notes (Signed)
Patient c/o sob since this AM. Went to [facility] and had O2 sats of 87% and dropped to 81% when ambulating. HX COPD and asthma

## 2019-01-23 NOTE — Discharge Instructions (Addendum)
For the next 2 to 3 days, please use your albuterol on a scheduled basis, 4-6 puffs every 4-6 hours while awake.  This should help with your asthma exacerbation.  Additionally, if you are not already, please obtain an over-the-counter antiallergy medication such as cetirizine (Zyrtec) to help control your allergy symptoms, as this may also help improve your breathing as well.  Please follow-up with your primary care doctor within the next week to ensure your symptoms are improving.   Please return to the emergency department for any new or worsening symptoms.    At this time, your coronavirus swab results are pending. You should hear your results in 2-4 days if they are positive.   In the meantime, it is important to take precautions in case you are positive. This includes quarantining, wearing a mask, and social distancing.

## 2019-01-23 NOTE — ED Notes (Signed)
Family at bedside with pt.

## 2019-01-23 NOTE — ED Notes (Signed)
EDP Monks at bedside. Pt's resp: regular/unlabored. Pt resting calmly in bed. Pt requests not to have to use any steriods as she "gets too hyper" on them.

## 2019-01-25 ENCOUNTER — Other Ambulatory Visit: Payer: Self-pay

## 2019-01-25 ENCOUNTER — Ambulatory Visit (INDEPENDENT_AMBULATORY_CARE_PROVIDER_SITE_OTHER): Payer: Medicare Other | Admitting: Pulmonary Disease

## 2019-01-25 ENCOUNTER — Encounter: Payer: Self-pay | Admitting: Pulmonary Disease

## 2019-01-25 VITALS — BP 124/74 | HR 89 | Temp 97.4°F | Ht 61.0 in | Wt 98.4 lb

## 2019-01-25 DIAGNOSIS — J431 Panlobular emphysema: Secondary | ICD-10-CM | POA: Diagnosis not present

## 2019-01-25 DIAGNOSIS — R0600 Dyspnea, unspecified: Secondary | ICD-10-CM | POA: Diagnosis not present

## 2019-01-25 DIAGNOSIS — J8489 Other specified interstitial pulmonary diseases: Secondary | ICD-10-CM

## 2019-01-25 DIAGNOSIS — D71 Functional disorders of polymorphonuclear neutrophils: Secondary | ICD-10-CM | POA: Diagnosis not present

## 2019-01-25 MED ORDER — STIOLTO RESPIMAT 2.5-2.5 MCG/ACT IN AERS: 2.0000 | INHALATION_SPRAY | Freq: Every day | RESPIRATORY_TRACT | 6 refills | Status: DC

## 2019-01-25 MED ORDER — STIOLTO RESPIMAT 2.5-2.5 MCG/ACT IN AERS: 2.0000 | INHALATION_SPRAY | Freq: Every day | RESPIRATORY_TRACT | 0 refills | Status: DC

## 2019-01-25 NOTE — Patient Instructions (Signed)
1.  We will put you on Stiolto 2 puffs once daily this is basically a long-acting version of the Combivent.  You can still use albuterol (Ventolin) as needed up to 4 times a day.  Hopefully the Stiolto will make it so you do not have to use albuterol as often.  Use a spacer with the Ventolin inhaler.  2.  Have performed a cheek swab for alpha-1 antitrypsin.  3.  We will see you in follow-up in 3 to 4 weeks time.  Call sooner if you have any new problems with your breathing or if you have difficulties with the medications.  4.  We will also check a echocardiogram to check your heart.

## 2019-01-25 NOTE — Progress Notes (Signed)
Subjective:    Patient ID: Shelia Fernandez, female    DOB: 11/19/1934, 84 y.o.   MRN: [id number]  HPI The patient is an 84 year old lifelong never smoker (however significant secondhand smoke) who followed previously with Dr. Merton Border since 2017.  She was last evaluated here in January 2019.  Previously to that she followed at [facility] and had had PFTs in 2012.  The patient presents today as a second opinion.  She was evaluated by Pulmonary Medicine at [facility] on 6 October at the behest of her family.  As noted she previously followed with Dr. Alva Garnet and the patient's family had recommended she switch to [facility] after Dr. Alva Garnet departure.  The patient states that she was seen and had her medications switched from Combivent to Trelegy and Singulair added at nighttime.  She states that since that time she has not been doing well.  She presented to the emergency room on 19 October (note COVID-19 test pending) due to increasing shortness of breath.  She was initially noted to have oxygen level of 87% on room air but quickly compensated.  She was ambulated and she did desaturate to the 80s.  She had a chest x-ray that showed findings consistent with emphysema and no acute process.  She was managed with nebulizer therapy in the emergency room and these issues cleared.  She has returned to using Combivent on her own and discontinued Trelegy.  She is not taking Singulair.  She does not endorse any fevers, chills or sweats.  Since she resumed taking her Combivent and has used her albuterol inhaler (refill through the ED) she has felt better.  The ED physician had recommended prednisone taper however the patient has had prior issues with psychosis on steroids and avoids that.  I have reviewed her past records including those from [facility].  She was first evaluated at [facility] on 2011 when she started feeling issues with shortness of breath.  There is history of "asthma" on 2 sisters and history of emphysema  on the father.  Though she has never smoked she was exposed to secondhand smoke both on her father who smoked and her husband who smoked.  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  She had prolonged exposures to this.  Her pulmonary function testing has shown moderate dysfunction and has remained relatively stable between 2012 and 2017.  She last was tested in 2017.  She was given a trial of Spiriva previously however could not tolerate it due to lactose intolerance and lactose being the vehicle for the Spiriva capsule.  She was then switched to Combivent which she has been using 3 to 4 times a day without issue for a number of years.  She had had systemic steroids previously and has noted she had significant psychiatric side effects from this.  She tries to avoid steroids systemic and/or inhaled at all costs.  She has had 2 alpha-1 levels performed however no phenotype performed.  Levels have been at low end of normal.  Review of Systems  Constitutional: Negative.   HENT: Positive for congestion.   Respiratory: Positive for chest tightness and shortness of breath. Negative for wheezing.   Cardiovascular: Negative.   Gastrointestinal:       Gastroesophageal reflux symptoms  Endocrine: Negative.   Genitourinary: Negative.   Musculoskeletal: Negative.   Skin: Negative.   Allergic/Immunologic: Positive for environmental allergies.  Neurological: Negative.  Hematological: Negative.   Psychiatric/Behavioral: Negative.   All other systems reviewed and are negative.      Objective:   Physical Exam Vitals signs and nursing note reviewed.  Constitutional:      Appearance: She is underweight. She is not ill-appearing or toxic-appearing.  HENT:     Head: Normocephalic and atraumatic.     Comments: Mild temporal wasting.    Right Ear: External ear normal.     Left Ear: External ear normal.     Nose:     Comments:  Nose/mouth/throat not examined due to masking requirements for COVID 19. Eyes:     General: No scleral icterus.    Conjunctiva/sclera: Conjunctivae normal.     Pupils: Pupils are equal, round, and reactive to light.  Neck:     Musculoskeletal: Neck supple.     Thyroid: No thyromegaly.     Trachea: Trachea and phonation normal.  Cardiovascular:     Rate and Rhythm: Normal rate and regular rhythm.     Pulses: Normal pulses.     Heart sounds: S1 normal and S2 normal. Murmur present. Systolic murmur present with a grade of 2/6. No gallop.   Pulmonary:     Effort: Pulmonary effort is normal. No respiratory distress.     Breath sounds: No wheezing or rhonchi.     Comments: Coarse breath sounds, distant Chest:     Chest wall: Deformity (Increased AP diameter/kyphosis) present.  Abdominal:     General: Abdomen is flat. There is no distension.  Musculoskeletal: Normal range of motion.     Right lower leg: No edema.     Left lower leg: No edema.  Lymphadenopathy:     Cervical: No cervical adenopathy.  Skin:    General: Skin is warm and dry.  Neurological:     General: No focal deficit present.     Mental Status: She is alert and oriented to person, place, and time.  Psychiatric:        Mood and Affect: Mood normal.        Behavior: Behavior normal.    Pulmonary function tests from [facility] from 2012: Pulmonary Labs: Final 05/16/2010 11:44 TSUANG  Ref Best %Pred Best %Pred %Chg FVC Liters 2.54 1.98 78 FEV1 Liters 1.93 1.04 54 FEV1/FVC % 76 52 FEV1/SVC % 52 FEF25-75% L/sec 1.74 0.47 27 PEF L/sec 5.15 1.99 39 MVV L/min 82 31 38 Vtg Liters 3.75 VC Liters 2.54 1.98 78 TLC Liters 4.73 4.66 99 RV Liters 2.14 2.68 125 FRC PL Liters 2.70 3.66 136 ERV Liters 0.84 0.94 112 IC Liters 1.68 1.00 60 DLCO mL/mmHg/min 16.8 9.4 56 VA Liters 2.56 IVC Liters 1.71 BHT Sec 11.32 Subsequent PFTs performed July 2017 persistent moderate obstruction, normal lung volumes, moderate decrease in  diffusion capacity relatively unchanged from above.  She has not exhibited airways reversibility on any PFT performed.  Laboratory data performed 23 January 2019 and the [facility] ED has been reviewed.  NOTE: COVID-19 testing pending.  Ambulatory oximetry today showed no significant oxygen desaturation.  Assessment & Plan:   1.  Dyspnea: Believe this issue may be multifactorial.  The patient does have evidence of obstructive lung disease with no airways reversibility.  It appears that she is not optimally compensated.  We will make inhaler changes as below.  The patient has exhibited issues with diastolic dysfunction on prior echoes.  This has not been revisited since at least 2017.  She has a past history of rheumatic fever.  Given her  issues with chronic lung disease potential cor pulmonale is also an issue.  Will obtain 2D echo to evaluate this issue.  We will reassess the patient on follow-up as to capacity to undergo PFTs.  She will need assessment with PFTs to determine if she has had significant pulmonary deterioration.  2.  COPD/emphysema: Though the patient is a lifelong never smoker she has had significant secondhand exposure as well as exposure to smoke and fumes.  She has had prior low end of normal alpha-1 antitrypsin levels however believe that she should have phenotype performed.  Alpha-1 antitrypsin phenotype testing was sent today.  Etiology of obstructive lung disease could be due to significant secondhand smoke as above, alpha-1 antitrypsin deficiency, and/or asthma with airway remodeling and COPD features due to lack of reversibility.  In a September 13, 2018 issue of JAMA a study by the [employer] and [place] was cited that shows that people who do not smoke or have any other risk factors for COPD may still get COPD if they have small airways relative to the size of the lungs.  This would lower the breathing capacity and put these patients at greater risk.  This abnormal lung  development may account for a large portion of COPD risk amongst older adults.  Her chest x-ray however is consistent with emphysema and diffusion capacity is reduced also consistent with emphysema.  With regards to her inhaler therapy we will initiate Stiolto as she is unhappy with Trelegy.  Stiolto is Spiriva/olodaterol or basically a long-acting version of Combivent.  This was explained to the patient.  She was instructed not to use Combivent while using the Spiriva/olodaterol combination (Stiolto).  She may use albuterol as needed for breakthrough shortness of breath.  She was provided a spacer for use with her rescue inhaler.  She was taught the proper use of the Stiolto inhaler and was able to reproduce the maneuvers.  Since she does not have airways reversibility and inhaled corticosteroid will be of lower to no utility.  She is to call us should she have difficulties with any of these medications.  She was instructed to discontinue Trelegy and Combivent.  She is to follow-up in 3 to 4 weeks time.  She is to contact us prior to that time should any new difficulties arise.  3.  Old granulomatous disease: This is benign and needs no further work-up.  4.  COVID-19 testing pending: This will need to be followed up on.  Unsure how she passed through the screening to come into the office.   Total visit time 45 minutes  This chart was dictated using voice recognition software/Dragon.  Despite best efforts to proofread, errors can occur which can change the meaning.  Any change was purely unintentional.

## 2019-01-26 LAB — NOVEL CORONAVIRUS, NAA (HOSP ORDER, SEND-OUT TO REF LAB; TAT 18-24 HRS): SARS-CoV-2, NAA: NOT DETECTED

## 2019-01-27 ENCOUNTER — Telehealth: Payer: Self-pay | Admitting: Pulmonary Disease

## 2019-01-27 NOTE — Telephone Encounter (Signed)
Spoke to pt, who stated she is experiencing increased sob.  Pt has used albuterol HFA once today with some improvement and stiolto. Pt denied other sx.  Pt also stated that she canceled Echo because she thought she had one in the ED.   Pt would like to reschedule Echo.  Spoke to LG verbally- who recommended if sx worsen, pt should go to ED for evaluation of possible PE. LG feels that sx are related to her heart, Pt is aware of recommendations and voiced her understanding.  Suanne Marker, please reschedule echo. Thanks

## 2019-01-30 NOTE — Telephone Encounter (Signed)
2D Echo R/S for Tues 02/14/2019 at 10:00 at [facility]. Pt to arrive at 9:45 and check in at the Registration Desk.  Called and spoke with patient and she is aware of appointment date, time and location. Nothing else needed at this time. Rhonda J Cobb

## 2019-02-01 ENCOUNTER — Ambulatory Visit: Payer: Medicare Other | Admitting: Pulmonary Disease

## 2019-02-07 ENCOUNTER — Ambulatory Visit: Payer: Medicare Other

## 2019-02-14 ENCOUNTER — Ambulatory Visit
Admission: RE | Admit: 2019-02-14 | Discharge: 2019-02-14 | Disposition: A | Discharge status: Home / Self Care | Payer: Medicare Other | Source: Ambulatory Visit | Attending: Pulmonary Disease | Admitting: Pulmonary Disease

## 2019-02-14 ENCOUNTER — Other Ambulatory Visit: Payer: Self-pay

## 2019-02-14 DIAGNOSIS — I341 Nonrheumatic mitral (valve) prolapse: Secondary | ICD-10-CM | POA: Diagnosis not present

## 2019-02-14 DIAGNOSIS — I071 Rheumatic tricuspid insufficiency: Secondary | ICD-10-CM | POA: Insufficient documentation

## 2019-02-14 DIAGNOSIS — R0600 Dyspnea, unspecified: Secondary | ICD-10-CM | POA: Diagnosis not present

## 2019-02-14 NOTE — Progress Notes (Signed)
*  PRELIMINARY RESULTS* Echocardiogram 2D Echocardiogram has been performed.  Shelia Fernandez 02/14/2019, 10:41 AM

## 2019-02-21 ENCOUNTER — Ambulatory Visit: Payer: Medicare Other | Admitting: Pulmonary Disease

## 2019-02-21 ENCOUNTER — Encounter: Payer: Self-pay | Admitting: Pulmonary Disease

## 2019-02-21 ENCOUNTER — Telehealth: Payer: Self-pay | Admitting: Pulmonary Disease

## 2019-02-21 ENCOUNTER — Other Ambulatory Visit: Payer: Self-pay

## 2019-02-21 VITALS — BP 126/80 | HR 86 | Temp 97.7°F | Ht 61.0 in | Wt 96.6 lb

## 2019-02-21 DIAGNOSIS — J431 Panlobular emphysema: Secondary | ICD-10-CM

## 2019-02-21 DIAGNOSIS — J449 Chronic obstructive pulmonary disease, unspecified: Secondary | ICD-10-CM

## 2019-02-21 MED ORDER — MOMETASONE FUROATE 50 MCG/ACT NA SUSP: 1.0000 | Freq: Every day | NASAL | 6 refills | Status: DC

## 2019-02-21 MED ORDER — COMBIVENT RESPIMAT 20-100 MCG/ACT IN AERS: 1.0000 | INHALATION_SPRAY | Freq: Four times a day (QID) | RESPIRATORY_TRACT | 11 refills | Status: DC

## 2019-02-21 NOTE — Telephone Encounter (Signed)
Called and spoke to [place] with [facility], who is requesting clarification on Nasonex Rx. Earnest Bailey is aware that it is 1 spray each nostril daily.  Nothing further is needed.

## 2019-02-21 NOTE — Progress Notes (Signed)
    Assessment & Plan:  1. Panlobular emphysema (HCC) (Primary)  2. COPD, moderate (HCC)   Patient Instructions  1.  We will continue Combivent  as you prefer this medication it seems to help you the most.   2.  Heart shows some minor issues mainly that your left main chamber is a little stiff and there is slight increase in the pressure of the artery going from the heart to the lungs.  I am going to check an oxygen level at nighttime to make sure that this is not causing this issue.  Currently it is at a very low level (grade 1).  We will keep a close eye on this.  3.  I have sent a prescription for a nasal spray to help you with your nighttime nasal congestion.   4.  We will see you back in 2 months time.  Please note: late entry documentation due to logistical difficulties during COVID-19 pandemic. This note is filed for information purposes only, and is not intended to be used for billing, nor does it represent the full scope/nature of the visit in question. Please see any associated scanned media linked to date of encounter for additional pertinent information.  Subjective:    HPI: Shelia Fernandez is a 84 y.o. female presenting to the pulmonology clinic on 02/21/2019 with report of: Follow-up (pt switched back to combivent  QID, as stiolto did not work for her. no current sx today. )     Outpatient Encounter Medications as of 02/21/2019  Medication Sig   Calcium  Citrate-Vitamin D  (CALCIUM  CITRATE + D PO) Take 1 tablet by mouth daily.   [DISCONTINUED] ARMOUR THYROID  30 MG tablet TAKE ONE TABLET BY MOUTH DAILY (Patient taking differently: Take 30 mg by mouth daily before breakfast.)   [DISCONTINUED] Ipratropium-Albuterol  (COMBIVENT  RESPIMAT) 20-100 MCG/ACT AERS respimat Inhale 1 puff into the lungs every 6 (six) hours.   [DISCONTINUED] Ipratropium-Albuterol  (COMBIVENT  RESPIMAT) 20-100 MCG/ACT AERS respimat Inhale 1 puff into the lungs every 6 (six) hours.   [DISCONTINUED]  Multiple Vitamin (MULTI-VITAMINS) TABS Take 1 tablet by mouth.    [DISCONTINUED] thyroid  (ARMOUR THYROID ) 15 MG tablet Take 1 tablet (15 mg total) by mouth every other day. (Patient not taking: Reported on 04/21/2022)   [DISCONTINUED] albuterol  (VENTOLIN  HFA) 108 (90 Base) MCG/ACT inhaler Inhale 2 puffs into the lungs every 6 (six) hours as needed for wheezing or shortness of breath. (Patient not taking: Reported on 02/21/2019)   [DISCONTINUED] mometasone  (NASONEX ) 50 MCG/ACT nasal spray Place 1 spray into the nose daily. (Patient not taking: Reported on 08/21/2019)   [DISCONTINUED] Tiotropium Bromide-Olodaterol (STIOLTO RESPIMAT ) 2.5-2.5 MCG/ACT AERS Inhale 2 puffs into the lungs daily. (Patient not taking: Reported on 02/21/2019)   [DISCONTINUED] Tiotropium Bromide-Olodaterol (STIOLTO RESPIMAT ) 2.5-2.5 MCG/ACT AERS Inhale 2 puffs into the lungs daily. (Patient not taking: Reported on 02/21/2019)   No facility-administered encounter medications on file as of 02/21/2019.      Objective:   Vitals:   02/21/19 0959  BP: 126/80  Pulse: 86  Temp: 97.7 F (36.5 C)  Height: 5' 1 (1.549 m)  Weight: 96 lb 9.6 oz (43.8 kg)  SpO2: 95%  TempSrc: Temporal  BMI (Calculated): 18.26     Physical exam documentation is limited by delayed entry of information.

## 2019-02-21 NOTE — Patient Instructions (Addendum)
1.  We will continue Combivent as you prefer this medication it seems to help you the most.   2.  Heart shows some minor issues mainly that your left main chamber is a little stiff and there is slight increase in the pressure of the artery going from the heart to the lungs.  I am going to check an oxygen level at nighttime to make sure that this is not causing this issue.  Currently it is at a very low level (grade 1).  We will keep a close eye on this.  3.  I have sent a prescription for a nasal spray to help you with your nighttime nasal congestion.   4.  We will see you back in 2 months time.

## 2019-03-28 ENCOUNTER — Other Ambulatory Visit: Payer: Self-pay | Admitting: Obstetrics and Gynecology

## 2019-03-28 DIAGNOSIS — N950 Postmenopausal bleeding: Secondary | ICD-10-CM

## 2019-03-28 DIAGNOSIS — R102 Pelvic and perineal pain: Secondary | ICD-10-CM

## 2019-04-11 ENCOUNTER — Other Ambulatory Visit: Payer: Self-pay

## 2019-04-11 ENCOUNTER — Ambulatory Visit
Admission: RE | Admit: 2019-04-11 | Discharge: 2019-04-11 | Disposition: A | Discharge status: Home / Self Care | Payer: Medicare PPO | Source: Ambulatory Visit | Attending: Obstetrics and Gynecology | Admitting: Obstetrics and Gynecology

## 2019-04-11 DIAGNOSIS — N950 Postmenopausal bleeding: Secondary | ICD-10-CM | POA: Diagnosis present

## 2019-04-11 DIAGNOSIS — R102 Pelvic and perineal pain: Secondary | ICD-10-CM | POA: Diagnosis present

## 2019-04-11 LAB — POCT I-STAT CREATININE: Creatinine, Ser: 0.7 mg/dL (ref 0.44–1.00)

## 2019-04-11 MED ORDER — IOHEXOL 300 MG/ML  SOLN
75.0000 mL | Freq: Once | INTRAMUSCULAR | Status: AC | PRN
  Administered 2019-04-11: 75 mL via INTRAVENOUS

## 2019-08-21 ENCOUNTER — Encounter: Payer: Self-pay | Admitting: Emergency Medicine

## 2019-08-21 ENCOUNTER — Observation Stay
Admission: EM | Admit: 2019-08-21 | Discharge: 2019-08-21 | Disposition: A | Discharge status: Home / Self Care | Payer: Medicare PPO | Attending: Internal Medicine | Admitting: Internal Medicine

## 2019-08-21 ENCOUNTER — Emergency Department: Payer: Medicare PPO

## 2019-08-21 ENCOUNTER — Other Ambulatory Visit: Payer: Self-pay

## 2019-08-21 DIAGNOSIS — E039 Hypothyroidism, unspecified: Secondary | ICD-10-CM | POA: Diagnosis present

## 2019-08-21 DIAGNOSIS — Z808 Family history of malignant neoplasm of other organs or systems: Secondary | ICD-10-CM | POA: Diagnosis not present

## 2019-08-21 DIAGNOSIS — I252 Old myocardial infarction: Secondary | ICD-10-CM | POA: Diagnosis not present

## 2019-08-21 DIAGNOSIS — R0602 Shortness of breath: Secondary | ICD-10-CM | POA: Diagnosis present

## 2019-08-21 DIAGNOSIS — R0600 Dyspnea, unspecified: Secondary | ICD-10-CM

## 2019-08-21 DIAGNOSIS — J441 Chronic obstructive pulmonary disease with (acute) exacerbation: Secondary | ICD-10-CM | POA: Diagnosis not present

## 2019-08-21 DIAGNOSIS — R109 Unspecified abdominal pain: Secondary | ICD-10-CM | POA: Diagnosis not present

## 2019-08-21 DIAGNOSIS — E872 Acidosis, unspecified: Secondary | ICD-10-CM

## 2019-08-21 DIAGNOSIS — R531 Weakness: Secondary | ICD-10-CM

## 2019-08-21 DIAGNOSIS — Z79890 Hormone replacement therapy (postmenopausal): Secondary | ICD-10-CM | POA: Insufficient documentation

## 2019-08-21 DIAGNOSIS — R0902 Hypoxemia: Secondary | ICD-10-CM

## 2019-08-21 DIAGNOSIS — Z803 Family history of malignant neoplasm of breast: Secondary | ICD-10-CM | POA: Insufficient documentation

## 2019-08-21 DIAGNOSIS — Z20822 Contact with and (suspected) exposure to covid-19: Secondary | ICD-10-CM | POA: Insufficient documentation

## 2019-08-21 DIAGNOSIS — Z79899 Other long term (current) drug therapy: Secondary | ICD-10-CM | POA: Insufficient documentation

## 2019-08-21 DIAGNOSIS — Z8049 Family history of malignant neoplasm of other genital organs: Secondary | ICD-10-CM | POA: Insufficient documentation

## 2019-08-21 DIAGNOSIS — R112 Nausea with vomiting, unspecified: Secondary | ICD-10-CM | POA: Insufficient documentation

## 2019-08-21 LAB — COMPREHENSIVE METABOLIC PANEL
ALT: 19 U/L (ref 0–44)
AST: 27 U/L (ref 15–41)
Albumin: 3.9 g/dL (ref 3.5–5.0)
Alkaline Phosphatase: 61 U/L (ref 38–126)
Anion gap: 9 (ref 5–15)
BUN: 26 mg/dL — ABNORMAL HIGH (ref 8–23)
CO2: 24 mmol/L (ref 22–32)
Calcium: 9.7 mg/dL (ref 8.9–10.3)
Chloride: 104 mmol/L (ref 98–111)
Creatinine, Ser: 0.72 mg/dL (ref 0.44–1.00)
GFR calc Af Amer: >60 mL/min (ref >60)
GFR calc non Af Amer: >60 mL/min (ref >60)
Glucose, Bld: 193 mg/dL — ABNORMAL HIGH (ref 70–99)
Potassium: 4.2 mmol/L (ref 3.5–5.1)
Sodium: 137 mmol/L (ref 135–145)
Total Bilirubin: 0.9 mg/dL (ref 0.3–1.2)
Total Protein: 7.8 g/dL (ref 6.5–8.1)

## 2019-08-21 LAB — PROCALCITONIN: Procalcitonin: 0.23 ng/mL

## 2019-08-21 LAB — URINALYSIS, COMPLETE (UACMP) WITH MICROSCOPIC
Bacteria, UA: NONE SEEN
Bilirubin Urine: NEGATIVE
Glucose, UA: >=500 mg/dL — AB
Ketones, ur: NEGATIVE mg/dL
Leukocytes,Ua: NEGATIVE
Nitrite: NEGATIVE
Protein, ur: NEGATIVE mg/dL
Specific Gravity, Urine: 1.025 (ref 1.005–1.030)
Squamous Epithelial / HPF: NONE SEEN (ref 0–5)
pH: 5 (ref 5.0–8.0)

## 2019-08-21 LAB — TROPONIN I (HIGH SENSITIVITY): Troponin I (High Sensitivity): 10 ng/L (ref <18)

## 2019-08-21 LAB — BRAIN NATRIURETIC PEPTIDE: B Natriuretic Peptide: 96.7 pg/mL (ref 0.0–100.0)

## 2019-08-21 LAB — CBC WITH DIFFERENTIAL/PLATELET
Abs Immature Granulocytes: 0.05 10*3/uL (ref 0.00–0.07)
Basophils Absolute: 0 10*3/uL (ref 0.0–0.1)
Basophils Relative: 0 %
Eosinophils Absolute: 0 10*3/uL (ref 0.0–0.5)
Eosinophils Relative: 0 %
HCT: 40.1 % (ref 36.0–46.0)
Hemoglobin: 13.3 g/dL (ref 12.0–15.0)
Immature Granulocytes: 0 %
Lymphocytes Relative: 14 %
Lymphs Abs: 2 10*3/uL (ref 0.7–4.0)
MCH: 27.4 pg (ref 26.0–34.0)
MCHC: 33.2 g/dL (ref 30.0–36.0)
MCV: 82.5 fL (ref 80.0–100.0)
Monocytes Absolute: 0.9 10*3/uL (ref 0.1–1.0)
Monocytes Relative: 7 %
Neutro Abs: 11.1 10*3/uL — ABNORMAL HIGH (ref 1.7–7.7)
Neutrophils Relative %: 79 %
Platelets: 279 10*3/uL (ref 150–400)
RBC: 4.86 MIL/uL (ref 3.87–5.11)
RDW: 13.4 % (ref 11.5–15.5)
WBC: 14.1 10*3/uL — ABNORMAL HIGH (ref 4.0–10.5)
nRBC: 0 % (ref 0.0–0.2)

## 2019-08-21 LAB — SARS CORONAVIRUS 2 BY RT PCR (HOSPITAL ORDER, PERFORMED IN ~~LOC~~ HOSPITAL LAB): SARS Coronavirus 2: NEGATIVE

## 2019-08-21 LAB — LACTIC ACID, PLASMA
Lactic Acid, Venous: 3.9 mmol/L (ref 0.5–1.9)
Lactic Acid, Venous: 1.9 mmol/L (ref 0.5–1.9)

## 2019-08-21 MED ORDER — ALBUTEROL SULFATE (2.5 MG/3ML) 0.083% IN NEBU
5.0000 mg | INHALATION_SOLUTION | Freq: Once | RESPIRATORY_TRACT | Status: AC
  Administered 2019-08-21: 5 mg via RESPIRATORY_TRACT
  Filled 2019-08-21: qty 6

## 2019-08-21 MED ORDER — SODIUM CHLORIDE 0.9% FLUSH
3.0000 mL | Freq: Once | INTRAVENOUS | Status: AC
  Administered 2019-08-21: 3 mL via INTRAVENOUS

## 2019-08-21 MED ORDER — SODIUM CHLORIDE 0.9 % IV SOLN: 1.0000 g | Freq: Once | INTRAVENOUS | Status: DC

## 2019-08-21 MED ORDER — METRONIDAZOLE IN NACL 5-0.79 MG/ML-% IV SOLN
500.0000 mg | Freq: Three times a day (TID) | INTRAVENOUS | Status: DC
Start: 2019-08-21 — End: 2019-08-22

## 2019-08-21 MED ORDER — ONDANSETRON HCL 4 MG/2ML IJ SOLN
INTRAMUSCULAR | Status: AC
  Administered 2019-08-21: 4 mg via INTRAVENOUS
  Filled 2019-08-21: qty 2

## 2019-08-21 MED ORDER — ENOXAPARIN SODIUM 40 MG/0.4ML ~~LOC~~ SOLN
40.0000 mg | SUBCUTANEOUS | Status: DC
Start: 2019-08-21 — End: 2019-08-22

## 2019-08-21 MED ORDER — SODIUM CHLORIDE 0.9 % IV BOLUS
1000.0000 mL | Freq: Once | INTRAVENOUS | Status: AC
  Administered 2019-08-21: 1000 mL via INTRAVENOUS

## 2019-08-21 MED ORDER — ALBUTEROL SULFATE (2.5 MG/3ML) 0.083% IN NEBU: 2.5000 mg | INHALATION_SOLUTION | RESPIRATORY_TRACT | Status: DC | PRN

## 2019-08-21 MED ORDER — IPRATROPIUM-ALBUTEROL 0.5-2.5 (3) MG/3ML IN SOLN
3.0000 mL | Freq: Four times a day (QID) | RESPIRATORY_TRACT | Status: DC
Start: 2019-08-21 — End: 2019-08-22

## 2019-08-21 MED ORDER — METHYLPREDNISOLONE SODIUM SUCC 40 MG IJ SOLR
40.0000 mg | Freq: Four times a day (QID) | INTRAMUSCULAR | Status: DC
Start: 2019-08-21 — End: 2019-08-22

## 2019-08-21 MED ORDER — IOHEXOL 300 MG/ML  SOLN
75.0000 mL | Freq: Once | INTRAMUSCULAR | Status: AC | PRN
  Administered 2019-08-21: 75 mL via INTRAVENOUS

## 2019-08-21 MED ORDER — SODIUM CHLORIDE 0.9 % IV SOLN: 2.0000 g | Freq: Two times a day (BID) | INTRAVENOUS | Status: DC

## 2019-08-21 MED ORDER — METHYLPREDNISOLONE SODIUM SUCC 125 MG IJ SOLR
125.0000 mg | Freq: Once | INTRAMUSCULAR | Status: DC
  Filled 2019-08-21: qty 2

## 2019-08-21 MED ORDER — PREDNISONE 20 MG PO TABS
40.0000 mg | ORAL_TABLET | Freq: Every day | ORAL | Status: DC
Start: 2019-08-23 — End: 2019-08-22

## 2019-08-21 MED ORDER — IPRATROPIUM-ALBUTEROL 0.5-2.5 (3) MG/3ML IN SOLN
3.0000 mL | Freq: Once | RESPIRATORY_TRACT | Status: DC
  Filled 2019-08-21: qty 3

## 2019-08-21 MED ORDER — SODIUM CHLORIDE 0.9 % IV SOLN
1.0000 g | INTRAVENOUS | Status: DC
  Administered 2019-08-21: 1 g via INTRAVENOUS
  Filled 2019-08-21: qty 10

## 2019-08-21 MED ORDER — SODIUM CHLORIDE 0.9 % IV SOLN
500.0000 mg | Freq: Once | INTRAVENOUS | Status: DC
  Filled 2019-08-21: qty 500

## 2019-08-21 MED ORDER — VANCOMYCIN HCL IN DEXTROSE 1-5 GM/200ML-% IV SOLN
1000.0000 mg | Freq: Once | INTRAVENOUS | Status: DC
  Filled 2019-08-21: qty 200

## 2019-08-21 MED ORDER — CEFTRIAXONE SODIUM 1 G IJ SOLR: 1.0000 g | INTRAMUSCULAR | Status: DC

## 2019-08-21 MED ORDER — SODIUM CHLORIDE 0.9 % IV BOLUS
500.0000 mL | Freq: Once | INTRAVENOUS | Status: AC
  Administered 2019-08-21: 500 mL via INTRAVENOUS

## 2019-08-21 MED ORDER — ONDANSETRON HCL 4 MG/2ML IJ SOLN: 4.0000 mg | Freq: Once | INTRAMUSCULAR | Status: AC

## 2019-08-21 MED ORDER — SODIUM CHLORIDE 0.9 % IV BOLUS (SEPSIS): 500.0000 mL | Freq: Once | INTRAVENOUS | Status: DC

## 2019-08-21 MED ORDER — VANCOMYCIN HCL 750 MG/150ML IV SOLN: 750.0000 mg | INTRAVENOUS | Status: DC

## 2019-08-21 NOTE — ED Notes (Signed)
Pt ambulated in room with use of cane, gait steady. Pt reports that she is not short of breath during ambulation, O2 saturation 91% on RA during ambulation. Dr. Joni Fears informed

## 2019-08-21 NOTE — Progress Notes (Signed)
CODE SEPSIS - PHARMACY COMMUNICATION  **Broad Spectrum Antibiotics should be administered within 1 hour of Sepsis diagnosis**  Time Code Sepsis Called/Page Received: 1612  Antibiotics Ordered: ceftriaxone  Time of 1st antibiotic administration: 1635  Additional action taken by pharmacy: NA  If necessary, Name of Provider/Nurse Contacted: NA    Tawnya Crook ,PharmD Clinical Pharmacist  08/21/2019  6:22 PM

## 2019-08-21 NOTE — ED Provider Notes (Addendum)
[facility] Emergency Department Provider Note  ____________________________________________  Time seen: Approximately 5:09 PM  I have reviewed the triage vital signs and the nursing notes.   HISTORY  Chief Complaint Shortness of Breath    HPI Shelia Fernandez is a 85 y.o. female with a history of COPD GERD and pancreas divisum who comes ED complaining of shortness of breath for the last 2 days, gradual onset, constant, no aggravating or alleviating factors.  No cough or fever.  She does endorse chills but denies body aches.  She also has nausea, decreased appetite, generalized abdominal discomfort.  No diarrhea or constipation.  No radiating pain.      Past Medical History:  Diagnosis Date  . Asthma   . Benign positional vertigo    s/p ENT consultation with Eply maneuvers 2012-07-03.  [facility].  . Cataract   . Complication of anesthesia   . COPD (chronic obstructive pulmonary disease) ([place])   . Diarrhea   . Dyspnea    DOE  . GERD (gastroesophageal reflux disease)    has not bothered her for a while, especially when watching diet  . 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 Hypothyroidism   . MVP (mitral valve prolapse)   . Myocardial infarction ([place]) 1995-07-04   when husband died  . Osteoporosis    R hip fracture; L hip fracture.  Intolerance to bisphosphonates; GI upset.  Took bisphosphonates x 10 years; last bisphosphonates July 04, 1998.  s/p L wrist fracture Jul 04, 1999.  Marland Kitchen Pancreas anomaly, congenital   . PONV (postoperative nausea and vomiting)   . Thyroid disease    Hypothyroidism     Patient Active Problem List   Diagnosis Date Noted  . Acute dyspnea 08/21/2019  . Hypoxia 08/21/2019  . COPD exacerbation ([place]) 08/21/2019  . Lactic acidosis 08/21/2019  . Granulomatous interstitial lung disease ([place]) 01/25/2019  . Underweight 05/14/2014  . Osteoporosis 04/24/2013  . COPD (chronic obstructive pulmonary disease) ([place])  04/24/2013  . Hypothyroidism 04/24/2013  . Esophageal reflux 04/24/2013     Past Surgical History:  Procedure Laterality Date  . APPENDECTOMY    . CATARACT EXTRACTION W/PHACO Right 07/15/2016   Procedure: CATARACT EXTRACTION PHACO AND INTRAOCULAR LENS PLACEMENT (IOC);  Surgeon: Estill Cotta, MD;  Location: [facility] ORS;  Service: Ophthalmology;  Laterality: Right;  [place] 3:03.8 AP% 25.8 CDE 92.59 FLUID PACK LOT # [id number]  . CATARACT EXTRACTION W/PHACO Left 10/14/2016   Procedure: CATARACT EXTRACTION PHACO AND INTRAOCULAR LENS PLACEMENT (IOC);  Surgeon: Estill Cotta, MD;  Location: [facility] ORS;  Service: Ophthalmology;  Laterality: Left;  [place] 01:34.5 AP% 26.6 CDE 43.55 Fluid pack lot # [id number]  . COLONOSCOPY     x 2; failure due to spasms.  [place].  Marland Kitchen ENT consult     +BPV; Eply maneuvers wtih improvement.  [facility]/ENT.  Marland Kitchen FRACTURE SURGERY     R hip fracture s/p ORIF; L hip fracture s/p ORIF.  Marland Kitchen LAPAROSCOPIC SALPINGO OOPHERECTOMY    . TONSILLECTOMY    . TUBAL LIGATION       Prior to Admission medications   Medication Sig Start Date End Date Taking? Authorizing Provider  ARMOUR THYROID 30 MG tablet TAKE ONE TABLET BY MOUTH DAILY Patient taking differently: TAKE ONE TABLET BY MOUTH DAILY (TAKE WITH A 15 MG TABLET ON MONDAY [facility] AND Friday) 06/25/17  Yes Wardell Honour, MD  Calcium Citrate-Vitamin D (CALCIUM CITRATE + D PO) Take 1 tablet  by mouth daily.   Yes [provider]  Ipratropium-Albuterol (COMBIVENT RESPIMAT) 20-100 MCG/ACT AERS respimat Inhale 1 puff into the lungs every 6 (six) hours. 02/21/19  Yes Tyler Pita, MD  thyroid [facility] THYROID) 15 MG tablet Take 1 tablet (15 mg total) by mouth every other day. Patient taking differently: 15 mg. TAKE ONE TABLET BY MOUTH DAILY (TAKE WITH A 30 MG TABLET ON MONDAY [facility] AND FRIDAY 07/28/17  Yes Wardell Honour, MD     Allergies Sulfa antibiotics, Biaxin [clarithromycin], Gluten meal,  Lactose intolerance (gi), Penicillins, and Sudafed [pseudoephedrine hcl]   Family History  Problem Relation Age of Onset  . Cancer Sister        Terminal cancer in 2017; Breast cancer; endometrial cancer; vaginal cancer  . COPD Sister   . Breast cancer Sister   . Cancer Sister        Breast cancer  . Breast cancer Sister   . Cancer Mother 64       cervical cancer  . Asthma Mother   . COPD Father   . Cancer Sister        Melanoma    Social History Social History   Tobacco Use  . Smoking status: Passive Smoke Exposure - Never Smoker  . Smokeless tobacco: Never Used  . Tobacco comment: pt's father and husband both smoked in house.   Substance Use Topics  . Alcohol use: No    Alcohol/week: 0.0 standard drinks  . Drug use: No    Review of Systems  Constitutional:   No fever or chills.  ENT:   No sore throat. No rhinorrhea. Cardiovascular:   No chest pain or syncope. Respiratory:   No dyspnea or cough. Gastrointestinal: Positive for generalized abdominal pain and vomiting.  No diarrhea or constipation Musculoskeletal:   Negative for focal pain or swelling All other systems reviewed and are negative except as documented above in ROS and HPI.  ____________________________________________   PHYSICAL EXAM:  VITAL SIGNS: ED Triage Vitals  Enc Vitals Group     BP 08/21/19 1437 (!) 151/51     Pulse Rate 08/21/19 1437 (!) 112     Resp 08/21/19 1437 (!) 22     Temp 08/21/19 1437 98.8 F (37.1 C)     Temp Source 08/21/19 1437 Oral     SpO2 08/21/19 1437 96 %     Weight 08/21/19 1440 96 lb 9 oz (43.8 kg)     Height 08/21/19 1440 5\' 1"  (1.549 m)     Head Circumference --      Peak Flow --      Pain Score 08/21/19 1440 0     Pain Loc --      Pain Edu? --      Excl. in [place]? --     Vital signs reviewed, nursing assessments reviewed.   Constitutional:   Alert and oriented. Non-toxic appearance. Eyes:   Conjunctivae are normal. EOMI. PERRL. ENT      Head:    Normocephalic and atraumatic.      Nose:   Wearing a mask.      Mouth/Throat:   Wearing a mask.      Neck:   No meningismus. Full ROM. Hematological/Lymphatic/Immunilogical:   No cervical lymphadenopathy. Cardiovascular:   RRR. Symmetric bilateral radial and DP pulses.  No murmurs. Cap refill less than 2 seconds. Respiratory:   Normal respiratory effort without tachypnea/retractions. Breath sounds are clear and equal bilaterally. No wheezes/rales/rhonchi.  No wheezing or  cough with FEV1 maneuver.  Only slightly prolonged expiratory phase. Gastrointestinal:   Soft and nontender. Non distended. There is no CVA tenderness.  No rebound, rigidity, or guarding. Musculoskeletal:   Normal range of motion in all extremities. No joint effusions.  No lower extremity tenderness.  No edema. Neurologic:   Normal speech and language.  Motor grossly intact. No acute focal neurologic deficits are appreciated.  Skin:    Skin is warm, dry and intact. No rash noted.  No petechiae, purpura, or bullae.  ____________________________________________    LABS (pertinent positives/negatives) (all labs ordered are listed, but only abnormal results are displayed) Labs Reviewed  LACTIC ACID, PLASMA - Abnormal; Notable for the following components:      Result Value   Lactic Acid, Venous 3.9 (*)    All other components within normal limits  COMPREHENSIVE METABOLIC PANEL - Abnormal; Notable for the following components:   Glucose, Bld 193 (*)    BUN 26 (*)    All other components within normal limits  CBC WITH DIFFERENTIAL/PLATELET - Abnormal; Notable for the following components:   WBC 14.1 (*)    Neutro Abs 11.1 (*)    All other components within normal limits  URINALYSIS, COMPLETE (UACMP) WITH MICROSCOPIC - Abnormal; Notable for the following components:   Color, Urine YELLOW (*)    APPearance HAZY (*)    Glucose, UA >=500 (*)    Hgb urine dipstick MODERATE (*)    All other components within normal limits   SARS CORONAVIRUS 2 BY RT PCR (HOSPITAL ORDER, [place] [facility])  CULTURE, BLOOD (ROUTINE X 2)  CULTURE, BLOOD (ROUTINE X 2)  URINE CULTURE  RESPIRATORY PANEL BY PCR  MRSA PCR SCREENING  LACTIC ACID, PLASMA  PROCALCITONIN  BRAIN NATRIURETIC PEPTIDE  HIV ANTIBODY (ROUTINE TESTING W REFLEX)  FIBRIN DERIVATIVES D-DIMER ([facility] ONLY)  PROTIME-INR  APTT  INFLUENZA PANEL BY PCR (TYPE A & B)  CREATININE, SERUM  TROPONIN I (HIGH SENSITIVITY)   ____________________________________________   EKG  Interpreted by me Sinus tachycardia rate 109.  Normal axis and intervals.  Normal QRS.  Slightly depressed ST segments in all leads.  Normal T waves.  No significant change from January 23, 2019  ____________________________________________    RADIOLOGY  DG Chest 2 View  Result Date: 08/21/2019 CLINICAL DATA:  Short of breath and nausea vomiting for 2 days. EXAM: CHEST - 2 VIEW COMPARISON:  01/23/2019 FINDINGS: Cardiac silhouette is normal in size. No mediastinal or hilar masses. No evidence of adenopathy. Lungs are hyperexpanded. Mild stable upper lobe scarring, greater on the right with right apical calcifications. Mild reticular opacities consistent with scarring at the bases, also stable. No evidence of pneumonia or pulmonary edema. No pleural effusion or pneumothorax. Skeletal structures are demineralized but grossly intact. IMPRESSION: 1. No acute cardiopulmonary disease. 2. Advanced COPD, stable from the prior study. Electronically Signed   By: Lajean Manes M.D.   On: 08/21/2019 15:42   CT ABDOMEN PELVIS W CONTRAST  Result Date: 08/21/2019 CLINICAL DATA:  Nausea vomiting.  Nonlocalized abdominal pain. EXAM: CT ABDOMEN AND PELVIS WITH CONTRAST TECHNIQUE: Multidetector CT imaging of the abdomen and pelvis was performed using the standard protocol following bolus administration of intravenous contrast. CONTRAST:  65mL OMNIPAQUE IOHEXOL 300 MG/ML  SOLN COMPARISON:   04/11/2019. FINDINGS: Lower chest: Chronic lung base scarring, mild bronchiectasis and areas of mucous plugging. No acute findings. Hepatobiliary: Liver normal in size. Several low-density liver masses, largest centrally measuring 3 cm, stable  consistent with cysts. No new liver masses or lesions. Normal gallbladder. No bile duct dilation. Pancreas: Unremarkable. No pancreatic ductal dilatation or surrounding inflammatory changes. Spleen: Subtle low-attenuation lesions in the spleen, also stable which may reflect hemangiomas, not visualized on the delayed sequence. Spleen normal in size. Adrenals/Urinary Tract: No adrenal masses. Kidneys are normal in overall size, orientation and position. There is area of calcification along the medial aspect of the upper pole the left kidney extending into the perinephric space, of unclear etiology, but chronic and stable. Kidneys show symmetric enhancement and excretion. No masses, stones or hydronephrosis. Normal ureters. Bladder decompressed but otherwise unremarkable. Stomach/Bowel: Minimal hiatal hernia. Stomach otherwise unremarkable. Small bowel and colon are normal in caliber. No wall thickening. No inflammation. Vascular/Lymphatic: Aortic atherosclerosis. No aneurysm. No enlarged lymph nodes. Reproductive: Small postmenopausal uterus that is heterogeneous with dystrophic calcifications. This appearance is stable. No adnexal masses. Other: No abdominal wall hernia or abnormality. No abdominopelvic ascites. Musculoskeletal: Previous ORIF bilateral proximal femur fractures. Fractures are healed. Orthopedic hardware is well-seated. No acute fracture. No osteoblastic or osteolytic lesions. IMPRESSION: 1. No acute findings within the abdomen or pelvis. No findings to account for abdominal pain, nausea and vomiting. 2. Multiple chronic findings that are stable from the prior CT, including liver cysts and aortic atherosclerosis. 3. Lung bases show areas of scarring,  bronchiectasis and mucous plugging that are similar to the prior CT. Electronically Signed   By: Lajean Manes M.D.   On: 08/21/2019 16:39    ____________________________________________   PROCEDURES .Critical Care Performed by: Carrie Mew, MD Authorized by: Carrie Mew, MD   Critical care provider statement:    Critical care time (minutes):  33   Critical care time was exclusive of:  Separately billable procedures and treating other patients   Critical care was necessary to treat or prevent imminent or life-threatening deterioration of the following conditions:  Sepsis and dehydration   Critical care was time spent personally by me on the following activities:  Development of treatment plan with patient or surrogate, discussions with consultants, evaluation of patient's response to treatment, examination of patient, obtaining history from patient or surrogate, ordering and performing treatments and interventions, ordering and review of laboratory studies, ordering and review of radiographic studies, pulse oximetry, re-evaluation of patient's condition and review of old charts    ____________________________________________  DIFFERENTIAL DIAGNOSIS   COPD exacerbation, pneumonia, viral syndrome, GERD, gastritis, pancreatitis, diverticulitis, cystitis, sepsis  CLINICAL IMPRESSION / ASSESSMENT AND PLAN / ED COURSE  Medications ordered in the ED: Medications  methylPREDNISolone sodium succinate (SOLU-MEDROL) 125 mg/2 mL injection 125 mg (has no administration in time range)  ipratropium-albuterol (DUONEB) 0.5-2.5 (3) MG/3ML nebulizer solution 3 mL (has no administration in time range)  azithromycin (ZITHROMAX) 500 mg in sodium chloride 0.9 % 250 mL IVPB (has no administration in time range)  enoxaparin (LOVENOX) injection 40 mg (has no administration in time range)  methylPREDNISolone sodium succinate (SOLU-MEDROL) 40 mg/mL injection 40 mg (has no administration in time  range)    Followed by  predniSONE (DELTASONE) tablet 40 mg (has no administration in time range)  ipratropium-albuterol (DUONEB) 0.5-2.5 (3) MG/3ML nebulizer solution 3 mL (has no administration in time range)  albuterol (PROVENTIL) (2.5 MG/3ML) 0.083% nebulizer solution 2.5 mg (has no administration in time range)  sodium chloride 0.9 % bolus 500 mL (has no administration in time range)  metroNIDAZOLE (FLAGYL) IVPB 500 mg (has no administration in time range)  vancomycin (VANCOCIN) IVPB 1000 mg/200 mL premix (has  no administration in time range)  ceFEPIme (MAXIPIME) 2 g in sodium chloride 0.9 % 100 mL IVPB (has no administration in time range)  vancomycin (VANCOREADY) IVPB 750 mg/150 mL (has no administration in time range)  sodium chloride flush (NS) 0.9 % injection 3 mL (3 mLs Intravenous Given 08/21/19 1635)  ondansetron (ZOFRAN) injection 4 mg (4 mg Intravenous Given 08/21/19 1455)  sodium chloride 0.9 % bolus 1,000 mL (1,000 mLs Intravenous New Bag/Given 08/21/19 1550)  iohexol (OMNIPAQUE) 300 MG/ML solution 75 mL (75 mLs Intravenous Contrast Given 08/21/19 1621)  albuterol (PROVENTIL) (2.5 MG/3ML) 0.083% nebulizer solution 5 mg (5 mg Nebulization Given 08/21/19 1756)  sodium chloride 0.9 % bolus 500 mL (500 mLs Intravenous New Bag/Given 08/21/19 1756)    Pertinent labs & imaging results that were available during my care of the patient were reviewed by me and considered in my medical decision making (see chart for details).  Shelia Fernandez was evaluated in Emergency Department on 08/21/2019 for the symptoms described in the history of present illness. She was evaluated in the context of the global COVID-19 pandemic, which necessitated consideration that the patient might be at risk for infection with the SARS-CoV-2 virus that causes COVID-19. Institutional protocols and algorithms that pertain to the evaluation of patients at risk for COVID-19 are in a state of rapid change based on  information released by regulatory bodies including the CDC and federal and state organizations. These policies and algorithms were followed during the patient's care in the ED.   Patient presents with shortness of breath for 2 days along with GI symptoms.  Tachypneic and tachycardic on initial assessment with suspected pneumonia, possible early sepsis.  Initial lactate was 3.9.  Code sepsis initiated, 1 L fluid bolus, IV ceftriaxone ordered.  Will obtain CT scan of the abdomen and pelvis due to GI symptoms.  Clinical Course as of Aug 21 2115  Mon Aug 21, 2019  1654 CT unremarkable. Lactate normalized from 3.9 to 1.9 on repeat. Awaiting UA   [PS]  1707 UA normal. CXR unremarkable. No identifiable infectious source currently. Will cancel code sepsis.    [PS]  [id number] Will give albuterol and additional fluid bolus and reassess.   [PS]  1726 Procal low, not c/w bacterial illness  Procalcitonin [PS]  1907 Patient still quite symptomatic, tachycardic, tachypneic, shortness of breath on exertion with accessory muscle use.  Lung exam unchanged.  Sepsis reassessment has been completed.  On last blood pressure, map is 70, no signs of shock.  Evolving clinical picture is that of COPD exacerbation.  We will plan to hospitalize for continued treatment until clinically improved, along with awaiting prelim culture data.  Ceftriaxone and azithromycin given for CAP coverage.   [PS]    Clinical Course User Index [PS] Carrie Mew, MD    ----------------------------------------- 9:16 PM on 08/21/2019 -----------------------------------------  Patient now insists on being discharged home, and her daughter at bedside agrees with this plan.  Tachycardia is continued improving, now heart rate is 103, blood pressure is normal, oxygenation is 97% on room air.  Patient is calm and comfortable, unlabored breathing.  From seeing her own doctor, she has prednisone and doxycycline at home.  Declines a prescription  for Zofran.  She has medical decision-making capacity.  Gave return precautions for any worsening symptoms, will discharge home [place].   ____________________________________________   FINAL CLINICAL IMPRESSION(S) / ED DIAGNOSES    Final diagnoses:  COPD exacerbation (HCC)  Generalized weakness  ED Discharge Orders    None      Portions of this note were generated with dragon dictation software. Dictation errors may occur despite best attempts at proofreading.   Carrie Mew, MD 08/21/19 Kathyrn Drown    Carrie Mew, MD 08/21/19 2117

## 2019-08-21 NOTE — ED Notes (Signed)
This RN entered the room to draw blood from patient and administer scheduled medications when pt and daughter voiced that they want patient to be discharged. Daughter states "I can take her home and watch her and do exactly what yall are doing here". Dr. Joni Fears informed.

## 2019-08-21 NOTE — Progress Notes (Signed)
Pharmacy Antibiotic Note  Shelia Fernandez is a 85 y.o. female admitted on 08/21/2019. Pharmacy has been consulted for vancomycin and cefepime dosing.  Plan: Vancomycin 1 g IV x 1 followed by 750 mg IV q24h. Will follow renal function and plan to de-escalate as able.  Cefepime 2 g IV q12h (adjusted for renal function)  Height: 5\' 1"  (154.9 cm) Weight: 43.8 kg (96 lb 9 oz) IBW/kg (Calculated) : 47.8  Temp (24hrs), Avg:98.8 F (37.1 C), Min:98.8 F (37.1 C), Max:98.8 F (37.1 C)  Recent Labs  Lab 08/21/19 1452 08/21/19 1608  WBC 14.1*  --   CREATININE 0.72  --   LATICACIDVEN 3.9* 1.9    Estimated Creatinine Clearance: 37.5 mL/min (by C-G formula based on SCr of 0.72 mg/dL).    Allergies  Allergen Reactions  . Sulfa Antibiotics Hives and Rash  . Biaxin [Clarithromycin] Other (See Comments)    Had a strong a reaction to it (can't remember specifically).Patiet thinks the dose was too strong for her body weight  . Gluten Meal     Stomach upset  . Lactose Intolerance (Gi) Nausea And Vomiting  . Penicillins Hives and Rash  . Sudafed [Pseudoephedrine Hcl]     Can't recall reaction    Antimicrobials this admission: Vancomycin 5/17 >> Cefepime 5/17 >> Metronidazole 5/17 >>  Dose adjustments this admission: NA  Microbiology results: 5/17 BCx: pending 5/17 UCx: pending  5/17 MRSA PCR: ordered  Thank you for allowing pharmacy to be a part of this patient's care.  Tawnya Crook, PharmD 08/21/2019 8:20 PM

## 2019-08-21 NOTE — ED Notes (Signed)
Pt taken to CT.

## 2019-08-21 NOTE — ED Notes (Signed)
Pt still unable to give urine sample. Call bell in reach and patient aware to call when she is able to urinate.

## 2019-08-21 NOTE — Sepsis Progress Note (Signed)
Per note from [facility] @ 19:35 Code sepsis was discontinued from the ED prior to admission due to no evident source of infection.

## 2019-08-21 NOTE — ED Notes (Signed)
Lab informed to add on troponin

## 2019-08-21 NOTE — ED Triage Notes (Addendum)
C/O difficulty breathing since the weekend.   Patient shaking in triage.  C/O nausea as well.

## 2019-08-23 LAB — URINE CULTURE: Culture: NO GROWTH

## 2019-08-26 LAB — CULTURE, BLOOD (ROUTINE X 2)
Culture: NO GROWTH
Culture: NO GROWTH
Special Requests: ADEQUATE
Special Requests: ADEQUATE

## 2020-03-05 ENCOUNTER — Other Ambulatory Visit: Payer: Self-pay | Admitting: Pulmonary Disease

## 2020-08-30 ENCOUNTER — Other Ambulatory Visit: Payer: Self-pay

## 2020-08-30 ENCOUNTER — Emergency Department: Payer: Medicare PPO

## 2020-08-30 ENCOUNTER — Emergency Department
Admission: EM | Admit: 2020-08-30 | Discharge: 2020-08-30 | Disposition: A | Discharge status: Home / Self Care | Payer: Medicare PPO | Attending: Emergency Medicine | Admitting: Emergency Medicine

## 2020-08-30 ENCOUNTER — Encounter: Payer: Self-pay | Admitting: Emergency Medicine

## 2020-08-30 DIAGNOSIS — Z20822 Contact with and (suspected) exposure to covid-19: Secondary | ICD-10-CM | POA: Insufficient documentation

## 2020-08-30 DIAGNOSIS — Z7722 Contact with and (suspected) exposure to environmental tobacco smoke (acute) (chronic): Secondary | ICD-10-CM | POA: Diagnosis not present

## 2020-08-30 DIAGNOSIS — J069 Acute upper respiratory infection, unspecified: Secondary | ICD-10-CM | POA: Diagnosis not present

## 2020-08-30 DIAGNOSIS — J441 Chronic obstructive pulmonary disease with (acute) exacerbation: Secondary | ICD-10-CM | POA: Diagnosis not present

## 2020-08-30 DIAGNOSIS — E039 Hypothyroidism, unspecified: Secondary | ICD-10-CM | POA: Diagnosis not present

## 2020-08-30 DIAGNOSIS — R0602 Shortness of breath: Secondary | ICD-10-CM | POA: Diagnosis present

## 2020-08-30 DIAGNOSIS — J45909 Unspecified asthma, uncomplicated: Secondary | ICD-10-CM | POA: Diagnosis not present

## 2020-08-30 LAB — COMPREHENSIVE METABOLIC PANEL
ALT: 17 U/L (ref 0–44)
AST: 25 U/L (ref 15–41)
Albumin: 3.5 g/dL (ref 3.5–5.0)
Alkaline Phosphatase: 58 U/L (ref 38–126)
Anion gap: 10 (ref 5–15)
BUN: 19 mg/dL (ref 8–23)
CO2: 23 mmol/L (ref 22–32)
Calcium: 9.2 mg/dL (ref 8.9–10.3)
Chloride: 105 mmol/L (ref 98–111)
Creatinine, Ser: 0.46 mg/dL (ref 0.44–1.00)
GFR, Estimated: >60 mL/min (ref >60)
Glucose, Bld: 98 mg/dL (ref 70–99)
Potassium: 4 mmol/L (ref 3.5–5.1)
Sodium: 138 mmol/L (ref 135–145)
Total Bilirubin: 0.8 mg/dL (ref 0.3–1.2)
Total Protein: 7 g/dL (ref 6.5–8.1)

## 2020-08-30 LAB — CBC
HCT: 38.6 % (ref 36.0–46.0)
Hemoglobin: 12.6 g/dL (ref 12.0–15.0)
MCH: 27.3 pg (ref 26.0–34.0)
MCHC: 32.6 g/dL (ref 30.0–36.0)
MCV: 83.7 fL (ref 80.0–100.0)
Platelets: 289 10*3/uL (ref 150–400)
RBC: 4.61 MIL/uL (ref 3.87–5.11)
RDW: 13 % (ref 11.5–15.5)
WBC: 7.4 10*3/uL (ref 4.0–10.5)
nRBC: 0 % (ref 0.0–0.2)

## 2020-08-30 LAB — PROCALCITONIN: Procalcitonin: <0.1 ng/mL

## 2020-08-30 LAB — TROPONIN I (HIGH SENSITIVITY): Troponin I (High Sensitivity): 5 ng/L (ref <18)

## 2020-08-30 LAB — RESP PANEL BY RT-PCR (FLU A&B, COVID) ARPGX2
Influenza A by PCR: NEGATIVE
Influenza B by PCR: NEGATIVE
SARS Coronavirus 2 by RT PCR: NEGATIVE

## 2020-08-30 LAB — LACTIC ACID, PLASMA: Lactic Acid, Venous: 1.7 mmol/L (ref 0.5–1.9)

## 2020-08-30 MED ORDER — IPRATROPIUM-ALBUTEROL 0.5-2.5 (3) MG/3ML IN SOLN
3.0000 mL | Freq: Once | RESPIRATORY_TRACT | Status: AC
  Administered 2020-08-30: 3 mL via RESPIRATORY_TRACT
  Filled 2020-08-30: qty 3

## 2020-08-30 MED ORDER — ALBUTEROL SULFATE HFA 108 (90 BASE) MCG/ACT IN AERS: 2.0000 | INHALATION_SPRAY | RESPIRATORY_TRACT | Status: DC | PRN

## 2020-08-30 NOTE — ED Triage Notes (Signed)
Pt in via EMS from home with c/o SOB. EMS reports pt dx'd with URI, given treatment and today pt with increased SOB. EMS reports pt with accessory muscle use, and was placed on NRB by first responders. Pt 100% on NRB upon EMS arrival.   #18 left forearm, 539mls of fluid given by EMS CBG 131 EMS reports Code sepsis alert for pt

## 2020-08-30 NOTE — ED Provider Notes (Signed)
[facility] Emergency Department Provider Note  ____________________________________________   Event Date/Time   First MD Initiated Contact with Patient 08/30/20 1355     (approximate)  I have reviewed the triage vital signs and the nursing notes.   HISTORY  Chief Complaint Shortness of Breath  History provided by EMS.  HPI Shelia Fernandez is a 86 y.o. female with a history of COPD, mitral valve prolapse, hypothyroidism, and MI presents to the ED via EMS from home.  Patient called out secondary to sudden increase in shortness of breath.  Patient was apparently diagnosed with a URI last week, and given a course of antibiotics which she completed several days prior.  EMS reports accessory muscle use at home, prior to being placed on a nonrebreather.  Patient does not utilize home O2.  She is under percent on nonrebreather upon arrival with EMS.  She was also given a breathing treatment prior to arrival, and had some increase in her shortness of breath.  Patient denies any interim fever, chills, sweats.  She would describe a chest heaviness and tightness, as well as a nonproductive cough.  Past Medical History:  Diagnosis Date  . Asthma   . Benign positional vertigo    s/p ENT consultation with Eply maneuvers 2012-07-01.  [facility].  . Cataract   . Complication of anesthesia   . COPD (chronic obstructive pulmonary disease) ([place])   . Diarrhea   . Dyspnea    DOE  . GERD (gastroesophageal reflux disease)    has not bothered her for a while, especially when watching diet  . 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 Hypothyroidism   . MVP (mitral valve prolapse)   . Myocardial infarction ([place]) 07/02/95   when husband died  . Osteoporosis    R hip fracture; L hip fracture.  Intolerance to bisphosphonates; GI upset.  Took bisphosphonates x 10 years; last bisphosphonates 07-02-98.  s/p L wrist fracture 07-02-1999.  Marland Kitchen Pancreas anomaly, congenital    . PONV (postoperative nausea and vomiting)   . Thyroid disease    Hypothyroidism    Patient Active Problem List   Diagnosis Date Noted  . Acute dyspnea 08/21/2019  . Hypoxia 08/21/2019  . COPD exacerbation ([place]) 08/21/2019  . Lactic acidosis 08/21/2019  . 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 Underweight 05/14/2014  . Osteoporosis 04/24/2013  . COPD (chronic obstructive pulmonary disease) ([place]) 04/24/2013  . Hypothyroidism 04/24/2013  . Esophageal reflux 04/24/2013    Past Surgical History:  Procedure Laterality Date  . APPENDECTOMY    . CATARACT EXTRACTION W/PHACO Right 07/15/2016   Procedure: CATARACT EXTRACTION PHACO AND INTRAOCULAR LENS PLACEMENT (IOC);  Surgeon: Estill Cotta, MD;  Location: [facility] ORS;  Service: Ophthalmology;  Laterality: Right;  [place] 3:03.8 AP% 25.8 CDE 92.59 FLUID PACK LOT # [id number]  . CATARACT EXTRACTION W/PHACO Left 10/14/2016   Procedure: CATARACT EXTRACTION PHACO AND INTRAOCULAR LENS PLACEMENT (IOC);  Surgeon: Estill Cotta, MD;  Location: [facility] ORS;  Service: Ophthalmology;  Laterality: Left;  [place] 01:34.5 AP% 26.6 CDE 43.55 Fluid pack lot # [id number]  . COLONOSCOPY     x 2; failure due to spasms.  [place].  Marland Kitchen ENT consult     +BPV; Eply maneuvers wtih improvement.  [facility]/ENT.  Marland Kitchen FRACTURE SURGERY     R hip fracture s/p ORIF; L hip fracture s/p ORIF.  Marland Kitchen LAPAROSCOPIC SALPINGO OOPHERECTOMY    . TONSILLECTOMY    .  TUBAL LIGATION      Prior to Admission medications   Medication Sig Start Date End Date Taking? Authorizing Provider  ARMOUR THYROID 30 MG tablet TAKE ONE TABLET BY MOUTH DAILY Patient taking differently: TAKE ONE TABLET BY MOUTH DAILY (TAKE WITH A 15 MG TABLET ON MONDAY [facility] AND Friday) 06/25/17   Wardell Honour, MD  Calcium Citrate-Vitamin D (CALCIUM CITRATE + D PO) Take 1 tablet by mouth daily.    [provider]  Ipratropium-Albuterol (COMBIVENT RESPIMAT) 20-100 MCG/ACT AERS  respimat Inhale 1 puff into the lungs every 6 (six) hours. 02/21/19   Tyler Pita, MD  thyroid [facility] THYROID) 15 MG tablet Take 1 tablet (15 mg total) by mouth every other day. Patient taking differently: 15 mg. TAKE ONE TABLET BY MOUTH DAILY (TAKE WITH A 30 MG TABLET ON MONDAY [facility] AND FRIDAY 07/28/17   Wardell Honour, MD    Allergies Sulfa antibiotics, Biaxin [clarithromycin], Gluten meal, Lactose intolerance (gi), Penicillins, and Sudafed [pseudoephedrine hcl]  Family History  Problem Relation Age of Onset  . Cancer Sister        Terminal cancer in 2017; Breast cancer; endometrial cancer; vaginal cancer  . COPD Sister   . Breast cancer Sister   . Cancer Sister        Breast cancer  . Breast cancer Sister   . Cancer Mother 22       cervical cancer  . Asthma Mother   . COPD Father   . Cancer Sister        Melanoma    Social History Social History   Tobacco Use  . Smoking status: Passive Smoke Exposure - Never Smoker  . Smokeless tobacco: Never Used  . Tobacco comment: pt's father and husband both smoked in house.   Substance Use Topics  . Alcohol use: No    Alcohol/week: 0.0 standard drinks  . Drug use: No    Review of Systems  Constitutional: No fever/chills Eyes: No visual changes. ENT: No sore throat. Cardiovascular: Denies chest pain. Respiratory: Reports shortness of breath. Gastrointestinal: No abdominal pain.  No nausea, no vomiting.  No diarrhea.  No constipation. Genitourinary: Negative for dysuria. Musculoskeletal: Negative for back pain. Skin: Negative for rash. Neurological: Negative for headaches, focal weakness or numbness. ____________________________________________   PHYSICAL EXAM:  VITAL SIGNS: ED Triage Vitals  Enc Vitals Group     BP 08/30/20 1344 (!) 135/57     Pulse Rate 08/30/20 1344 86     Resp 08/30/20 1344 18     Temp 08/30/20 1344 98.3 F (36.8 C)     Temp Source 08/30/20 1344 Oral     SpO2 08/30/20 1344 100 %      Weight 08/30/20 1346 92 lb (41.7 kg)     Height 08/30/20 1346 5\' 1"  (1.549 m)     Head Circumference --      Peak Flow --      Pain Score 08/30/20 1345 4     Pain Loc --      Pain Edu? --      Excl. in [place]? --    Constitutional: Alert and oriented. Well appearing and in no acute distress. Eyes: Conjunctivae are normal. PERRL. EOMI. Head: Atraumatic. Nose: No congestion/rhinnorhea. Mouth/Throat: Mucous membranes are moist.  Oropharynx non-erythematous. Neck: No stridor.   Hematological/Lymphatic/Immunilogical: No cervical lymphadenopathy. Cardiovascular: Normal rate, regular rhythm. Grossly normal heart sounds.  Good peripheral circulation. Respiratory: Normal respiratory effort.  No retractions. Lungs CTAB. Gastrointestinal: Soft  and nontender. No distention. No abdominal bruits. No CVA tenderness. Musculoskeletal: No lower extremity tenderness nor edema.  No joint effusions. Neurologic:  Normal speech and language. No gross focal neurologic deficits are appreciated. No gait instability. Skin:  Skin is warm, dry and intact. No rash noted. Psychiatric: Mood and affect are normal. Speech and behavior are normal. ____________________________________________   LABS (all labs ordered are listed, but only abnormal results are displayed)  Labs Reviewed  RESP PANEL BY RT-PCR (FLU A&B, COVID) ARPGX2  CBC  COMPREHENSIVE METABOLIC PANEL  LACTIC ACID, PLASMA  PROCALCITONIN  TROPONIN I (HIGH SENSITIVITY)   ____________________________________________  EKG  Vent. rate 87 BPM PR interval 165 ms QRS duration 91 ms QT/QTcB 389/468 ms P-R-T axes 83 80 60 No STEMI ____________________________________________  RADIOLOGY I, Melvenia Needles, personally viewed and evaluated these images (plain radiographs) as part of my medical decision making, as well as reviewing the written report by the radiologist.  ED MD interpretation:  Agree with report  Official radiology  report(s): DG Chest 2 View  Result Date: 08/30/2020 CLINICAL DATA:  Shortness of breath EXAM: CHEST - 2 VIEW COMPARISON:  08/21/2019 FINDINGS: Cardiac shadow is within normal limits. Aortic calcifications are noted. Lungs are well aerated bilaterally. Calcified pleural plaques are again seen on the right. No focal infiltrate or effusion is seen. Midthoracic compression deformity is again noted and stable. IMPRESSION: Chronic changes without acute abnormality. Electronically Signed   By: Inez Catalina M.D.   On: 08/30/2020 15:38   ____________________________________________   PROCEDURES  Procedure(s) performed (including Critical Care):  Procedures  DuoNeb x 1  ____________________________________________   INITIAL IMPRESSION / ASSESSMENT AND PLAN / ED COURSE  As part of my medical decision making, I reviewed the following data within the [facility] History obtained from family, Labs reviewed WNL, Radiograph reviewed NAD and Notes from prior ED visits   Differential diagnosis includes, but is not limited to, ACS, aortic dissection, pulmonary embolism, cardiac tamponade, pneumothorax, pneumonia, pericarditis, myocarditis, GI-related causes including esophagitis/gastritis, and musculoskeletal chest wall pain.    Geriatric patient ED evaluation of episodic shortness of breath this morning.  She has a history of COPD, and recently completed a course of doxycycline from her PCP.  She was evaluated for shortness of breath and found to have stable vital signs on presentation without tachypnea, tachycardia, or hypoxia.  Patient with no accessory muscle use, and no indication of respiratory distress.  She is reassured by her normal work-up at this time.  She is requesting discharge.  She also notes improvement of her symptoms after DuoNeb given just prior to discharge.  She will follow-up with a primary provider for ongoing symptoms.  She is encouraged to take over-the-counter  Mucinex, and increase water intake to help make cough productive.  Return precautions have been discussed with the patient and her adult daughter.  ____________________________________________   FINAL CLINICAL IMPRESSION(S) / ED DIAGNOSES  Final diagnoses:  Viral URI with cough  COPD exacerbation [facility])     ED Discharge Orders    None      *Please note:  HOWARD PATTON was evaluated in Emergency Department on 08/30/2020 for the symptoms described in the history of present illness. She was evaluated in the context of the global COVID-19 pandemic, which necessitated consideration that the patient might be at risk for infection with the SARS-CoV-2 virus that causes COVID-19. Institutional protocols and algorithms that pertain to the evaluation of patients at risk for COVID-19 are  in a state of rapid change based on information released by regulatory bodies including the CDC and federal and state organizations. These policies and algorithms were followed during the patient's care in the ED.  Some ED evaluations and interventions may be delayed as a result of limited staffing during and the pandemic.*   Note:  This document was prepared using Dragon voice recognition software and may include unintentional dictation errors.    Melvenia Needles, PA-C 08/30/20 1634    Blake Divine, MD 08/31/20 [phone]

## 2020-08-30 NOTE — Discharge Instructions (Addendum)
Your exam, labs, chest ray all normal and reassuring at this time.  Symptoms may be related to an exacerbation of your COPD secondary to a viral upper respiratory infection.  Continue with your home meds as prescribed.  Consider taking over-the-counter Mucinex (guaifenesin) as needed for cough management and increase your intake of water.  Return to the ED if needed.

## 2020-08-30 NOTE — ED Triage Notes (Signed)
Physician in room upon pt arrival.

## 2020-09-04 ENCOUNTER — Telehealth: Payer: Self-pay | Admitting: Pulmonary Disease

## 2020-09-04 NOTE — Telephone Encounter (Addendum)
Spoke to patient and attempted to relay below recommendations/message. Patient became upset and ended call prematurely.

## 2020-09-04 NOTE — Telephone Encounter (Signed)
Please send in prednisone taper (40mg  x 2 days; 30mg  x 2 days; 20mg  x2 days; 10mg  x 2 days). Have her take mucinex 600mg  twice a day x 10 days. Continue combivent QID. First available apt in office with CXR.

## 2020-09-04 NOTE — Telephone Encounter (Addendum)
Spoke to patient and relayed below recommendations.  Increase work of breathing noted during conversation. Patient unable to complete full sentence without stopping to catch her breath. She does not wear supplemental oxygen. Unable to monitor spo2, as she does not have a pulse ox.   Recommended ED for further eval due to increase work of breathing. Patient declined, as she was just seen at the ED on 08/30/2020.  Patient stated that she can not tolerate prednisone. Prednisone "sends her through the roof". appt scheduled for 10/11/2020 at 11:00. Patient aware Beth, please advise. Thanks

## 2020-09-04 NOTE — Telephone Encounter (Signed)
ED visit 08/30/2020. Dx with bronchitis. EMS mentioned possible PNA.  Patient reports of increased sob and dry cough at times prod with clear sputum. Sx developed 08/29/2020. Denied fever, chills or sweats. two rounds of abx for sinus infection. Completed last course one week ago.  Using combivent QID.  Last seen 02/2019. Requesting sooner appt then July.  covid tested on 08/30/2020 which was negative.   Beth, please advise. Dr. Patsey Berthold is unavailable.

## 2020-09-04 NOTE — Telephone Encounter (Signed)
I agree with ED evaluation as we have no available apt today. Unfortunately there is nothing further we can do outpatient without assessing her. If her symptoms worsen needs to go to ED. She can call her PCP to see if they have any available apt today.

## 2020-10-11 ENCOUNTER — Ambulatory Visit: Payer: Medicare PPO | Admitting: Pulmonary Disease

## 2021-04-29 ENCOUNTER — Other Ambulatory Visit: Payer: Self-pay

## 2021-04-29 ENCOUNTER — Encounter: Payer: Self-pay | Admitting: Emergency Medicine

## 2021-04-29 ENCOUNTER — Emergency Department
Admission: EM | Admit: 2021-04-29 | Discharge: 2021-04-29 | Disposition: A | Discharge status: Home / Self Care | Payer: Medicare PPO | Attending: Emergency Medicine | Admitting: Emergency Medicine

## 2021-04-29 DIAGNOSIS — Z5321 Procedure and treatment not carried out due to patient leaving prior to being seen by health care provider: Secondary | ICD-10-CM | POA: Diagnosis not present

## 2021-04-29 DIAGNOSIS — R112 Nausea with vomiting, unspecified: Secondary | ICD-10-CM | POA: Diagnosis not present

## 2021-04-29 DIAGNOSIS — F0390 Unspecified dementia without behavioral disturbance: Secondary | ICD-10-CM | POA: Insufficient documentation

## 2021-04-29 DIAGNOSIS — Z20822 Contact with and (suspected) exposure to covid-19: Secondary | ICD-10-CM | POA: Diagnosis not present

## 2021-04-29 DIAGNOSIS — R1030 Lower abdominal pain, unspecified: Secondary | ICD-10-CM | POA: Insufficient documentation

## 2021-04-29 LAB — COMPREHENSIVE METABOLIC PANEL
ALT: 19 U/L (ref 0–44)
AST: 46 U/L — ABNORMAL HIGH (ref 15–41)
Albumin: 3.7 g/dL (ref 3.5–5.0)
Alkaline Phosphatase: 62 U/L (ref 38–126)
Anion gap: 13 (ref 5–15)
BUN: 23 mg/dL (ref 8–23)
CO2: 23 mmol/L (ref 22–32)
Calcium: 9.9 mg/dL (ref 8.9–10.3)
Chloride: 102 mmol/L (ref 98–111)
Creatinine, Ser: 0.65 mg/dL (ref 0.44–1.00)
GFR, Estimated: >60 mL/min (ref >60)
Glucose, Bld: 158 mg/dL — ABNORMAL HIGH (ref 70–99)
Potassium: 5.2 mmol/L — ABNORMAL HIGH (ref 3.5–5.1)
Sodium: 138 mmol/L (ref 135–145)
Total Bilirubin: 1.4 mg/dL — ABNORMAL HIGH (ref 0.3–1.2)
Total Protein: 7.4 g/dL (ref 6.5–8.1)

## 2021-04-29 LAB — CBC
HCT: 43 % (ref 36.0–46.0)
Hemoglobin: 13.7 g/dL (ref 12.0–15.0)
MCH: 27.2 pg (ref 26.0–34.0)
MCHC: 31.9 g/dL (ref 30.0–36.0)
MCV: 85.3 fL (ref 80.0–100.0)
Platelets: 319 10*3/uL (ref 150–400)
RBC: 5.04 MIL/uL (ref 3.87–5.11)
RDW: 14.5 % (ref 11.5–15.5)
WBC: 11.4 10*3/uL — ABNORMAL HIGH (ref 4.0–10.5)
nRBC: 0 % (ref 0.0–0.2)

## 2021-04-29 LAB — RESP PANEL BY RT-PCR (FLU A&B, COVID) ARPGX2
Influenza A by PCR: NEGATIVE
Influenza B by PCR: NEGATIVE
SARS Coronavirus 2 by RT PCR: NEGATIVE

## 2021-04-29 LAB — LIPASE, BLOOD: Lipase: 39 U/L (ref 11–51)

## 2021-04-29 MED ORDER — ONDANSETRON HCL 4 MG/2ML IJ SOLN
4.0000 mg | Freq: Once | INTRAMUSCULAR | Status: AC | PRN
  Administered 2021-04-29: 21:00:00 4 mg via INTRAVENOUS
  Filled 2021-04-29: qty 2

## 2021-04-29 NOTE — ED Triage Notes (Signed)
EMS brings pt in from home for c/o lower abd pain accomp by N/V; hx dementia, lives at home with family

## 2021-04-29 NOTE — ED Triage Notes (Signed)
Pt BIB EMS with c/o lower abdominal pain with N/V since 1500. Hx of dementia, lives with family.

## 2021-12-02 ENCOUNTER — Other Ambulatory Visit: Payer: Self-pay | Admitting: Pulmonary Disease

## 2021-12-02 DIAGNOSIS — J849 Interstitial pulmonary disease, unspecified: Secondary | ICD-10-CM

## 2021-12-12 ENCOUNTER — Ambulatory Visit
Admission: RE | Admit: 2021-12-12 | Discharge: 2021-12-12 | Disposition: A | Discharge status: Home / Self Care | Payer: Medicare PPO | Source: Ambulatory Visit | Attending: Pulmonary Disease | Admitting: Pulmonary Disease

## 2021-12-12 DIAGNOSIS — J849 Interstitial pulmonary disease, unspecified: Secondary | ICD-10-CM

## 2022-04-20 ENCOUNTER — Observation Stay
Admission: EM | Admit: 2022-04-20 | Discharge: 2022-04-23 | Disposition: A | Discharge status: Home / Self Care | Payer: Medicare PPO | Attending: Hospitalist | Admitting: Hospitalist

## 2022-04-20 ENCOUNTER — Emergency Department: Payer: Medicare PPO

## 2022-04-20 ENCOUNTER — Other Ambulatory Visit: Payer: Self-pay

## 2022-04-20 ENCOUNTER — Encounter: Payer: Self-pay | Admitting: *Deleted

## 2022-04-20 DIAGNOSIS — R531 Weakness: Secondary | ICD-10-CM | POA: Diagnosis present

## 2022-04-20 DIAGNOSIS — J449 Chronic obstructive pulmonary disease, unspecified: Secondary | ICD-10-CM | POA: Diagnosis not present

## 2022-04-20 DIAGNOSIS — J9601 Acute respiratory failure with hypoxia: Principal | ICD-10-CM | POA: Insufficient documentation

## 2022-04-20 DIAGNOSIS — E039 Hypothyroidism, unspecified: Secondary | ICD-10-CM | POA: Insufficient documentation

## 2022-04-20 DIAGNOSIS — B974 Respiratory syncytial virus as the cause of diseases classified elsewhere: Secondary | ICD-10-CM | POA: Insufficient documentation

## 2022-04-20 DIAGNOSIS — K219 Gastro-esophageal reflux disease without esophagitis: Secondary | ICD-10-CM

## 2022-04-20 DIAGNOSIS — R0602 Shortness of breath: Secondary | ICD-10-CM | POA: Diagnosis not present

## 2022-04-20 DIAGNOSIS — B338 Other specified viral diseases: Secondary | ICD-10-CM

## 2022-04-20 DIAGNOSIS — J189 Pneumonia, unspecified organism: Secondary | ICD-10-CM | POA: Diagnosis present

## 2022-04-20 DIAGNOSIS — A419 Sepsis, unspecified organism: Secondary | ICD-10-CM

## 2022-04-20 DIAGNOSIS — J84112 Idiopathic pulmonary fibrosis: Secondary | ICD-10-CM

## 2022-04-20 LAB — BASIC METABOLIC PANEL
Anion gap: 9 (ref 5–15)
BUN: 21 mg/dL (ref 8–23)
CO2: 25 mmol/L (ref 22–32)
Calcium: 9.1 mg/dL (ref 8.9–10.3)
Chloride: 99 mmol/L (ref 98–111)
Creatinine, Ser: 0.54 mg/dL (ref 0.44–1.00)
GFR, Estimated: >60 mL/min (ref >60)
Glucose, Bld: 183 mg/dL — ABNORMAL HIGH (ref 70–99)
Potassium: 3.6 mmol/L (ref 3.5–5.1)
Sodium: 133 mmol/L — ABNORMAL LOW (ref 135–145)

## 2022-04-20 LAB — CBC
HCT: 37.1 % (ref 36.0–46.0)
Hemoglobin: 12 g/dL (ref 12.0–15.0)
MCH: 26.6 pg (ref 26.0–34.0)
MCHC: 32.3 g/dL (ref 30.0–36.0)
MCV: 82.3 fL (ref 80.0–100.0)
Platelets: 254 10*3/uL (ref 150–400)
RBC: 4.51 MIL/uL (ref 3.87–5.11)
RDW: 13.7 % (ref 11.5–15.5)
WBC: 4.2 10*3/uL (ref 4.0–10.5)
nRBC: 0 % (ref 0.0–0.2)

## 2022-04-20 LAB — TROPONIN I (HIGH SENSITIVITY): Troponin I (High Sensitivity): 6 ng/L (ref <18)

## 2022-04-20 NOTE — ED Triage Notes (Signed)
Pt brought in via ems from [place].  Pt has RSV and is feeling weak.  Pt has sob.  Pt is on 3 liters oxygen [place].  Pt alert.   Iv in place.

## 2022-04-20 NOTE — ED Notes (Signed)
Pt noted to be attempting to climb out of stretcher. Attempts to educate pt on safety risks. Assisted pt back to position of comfort, lights dimmed as much as possible in triage area. Bed alarm in place, activated, and audible. Stretcher locked and in lowest position. Pt daughter at bedside at this time

## 2022-04-20 NOTE — ED Triage Notes (Signed)
EMS brings pt in from [place] (Room 25, [place]); +RSV on Friday; last 3 days increased weakness, currently taking antibiotics; 87% on ra and staff placed on 3l/min via [place]; hx pulmonary fibrosis

## 2022-04-20 NOTE — ED Provider Notes (Signed)
[facility] Provider Note    Event Date/Time   First MD Initiated Contact with Patient 04/20/22 2339     (approximate)   History   Weakness   HPI  Shelia Fernandez is a 88 y.o. female who presents to the ED for evaluation of Weakness   I reviewed PCP visit from 1/12.  Evaluated for couple days of cough and fever.  She had pneumonia in November and admitted to [facility]. At baseline patient has a history of severe COPD on prophylactic Levaquin 3 times weekly. Started on doxycycline a couple days ago by PCP.  Daughter brings patient to the ED for evaluation of worsening generalized weakness.  Daughter got back from a trip in the past 24 hours, visited her mother and noted she was increasingly weak and unable to perform her typical tasks.  Generalized weakness without falls or injuries.  Poor intake.  Cough and shortness of breath.  Physical Exam   Triage Vital Signs: ED Triage Vitals  Enc Vitals Group     BP 04/20/22 2103 (!) 130/54     Pulse Rate 04/20/22 2103 (!) 105     Resp 04/20/22 2103 (!) 24     Temp 04/20/22 2103 98.5 F (36.9 C)     Temp Source 04/20/22 2103 Oral     SpO2 04/20/22 2103 95 %     Weight 04/20/22 2108 90 lb 6.2 oz (41 kg)     Height 04/20/22 2108 '5\' 1"'$  (1.549 m)     Head Circumference --      Peak Flow --      Pain Score 04/20/22 2108 0     Pain Loc --      Pain Edu? --      Excl. in [place]? --     Most recent vital signs: Vitals:   04/20/22 2359 04/21/22 0103  BP: (!) 116/46   Pulse: 87   Resp: (!) 26   Temp:  98.6 F (37 C)  SpO2: 94%     General: Awake, no distress.  Very frail and thin. CV:  Good peripheral perfusion.  Tachycardic and regular Resp:  Tachypneic to the mid 20s.  Prolonged expiratory phase without much wheezing.  No focal features appreciated Abd:  No distention.  MSK:  No deformity noted.  Neuro:  No focal deficits appreciated. Other:     ED Results / Procedures / Treatments   Labs (all labs  ordered are listed, but only abnormal results are displayed) Labs Reviewed  BASIC METABOLIC PANEL - Abnormal; Notable for the following components:      Result Value   Sodium 133 (*)    Glucose, Bld 183 (*)    All other components within normal limits  CULTURE, BLOOD (ROUTINE X 2)  CULTURE, BLOOD (ROUTINE X 2)  URINE CULTURE  CBC  LACTIC ACID, PLASMA  PROTIME-INR  APTT  LACTIC ACID, PLASMA  URINALYSIS, COMPLETE (UACMP) WITH MICROSCOPIC  PROCALCITONIN  PROCALCITONIN  PROTIME-INR  CORTISOL-AM, BLOOD  BASIC METABOLIC PANEL  CBC  TROPONIN I (HIGH SENSITIVITY)  TROPONIN I (HIGH SENSITIVITY)    EKG Sinus tachycardia with rate of 103 bpm.  Normal axis and intervals.  Nonspecific ST changes without clear ischemic features.  RADIOLOGY 1 view CXR interpreted by me with hyperinflation and patchy bibasilar opacities  Official radiology report(s): DG Chest Port 1 View  Result Date: 04/20/2022 CLINICAL DATA:  Shortness of breath EXAM: PORTABLE CHEST 1 VIEW COMPARISON:  08/30/2020 FINDINGS: There is hyperinflation  of the lungs compatible with COPD. Patchy bilateral airspace opacities. Heart is normal size. Aortic calcifications. No effusions or acute bony abnormality. IMPRESSION: COPD. Patchy bilateral airspace disease may reflect pneumonia. Electronically Signed   By: Rolm Baptise M.D.   On: 04/20/2022 22:13    PROCEDURES and INTERVENTIONS:  .1-3 Lead EKG Interpretation  Performed by: Vladimir Crofts, MD Authorized by: Vladimir Crofts, MD     Interpretation: normal     ECG rate:  92   ECG rate assessment: normal     Rhythm: sinus rhythm     Ectopy: none     Conduction: normal   .Critical Care  Performed by: Vladimir Crofts, MD Authorized by: Vladimir Crofts, MD   Critical care provider statement:    Critical care time (minutes):  30   Critical care time was exclusive of:  Separately billable procedures and treating other patients   Critical care was necessary to treat or prevent  imminent or life-threatening deterioration of the following conditions:  Respiratory failure and sepsis   Critical care was time spent personally by me on the following activities:  Development of treatment plan with patient or surrogate, discussions with consultants, evaluation of patient's response to treatment, examination of patient, ordering and review of laboratory studies, ordering and review of radiographic studies, ordering and performing treatments and interventions, pulse oximetry, re-evaluation of patient's condition and review of old charts   Medications  thyroid (ARMOUR) tablet 30 mg (has no administration in time range)  thyroid (ARMOUR) tablet 15 mg (has no administration in time range)  calcium-vitamin D (OSCAL WITH D) 500-5 MG-MCG per tablet 1 tablet (has no administration in time range)  ipratropium-albuterol (DUONEB) 0.5-2.5 (3) MG/3ML nebulizer solution 3 mL (3 mLs Inhalation Given 04/21/22 0046)  enoxaparin (LOVENOX) injection 40 mg (0 mg Subcutaneous Hold 04/21/22 0101)  0.9 % NaCl with KCl 20 mEq/ L  infusion ( Intravenous New Bag/Given 04/21/22 0052)  cefTRIAXone (ROCEPHIN) 2 g in sodium chloride 0.9 % 100 mL IVPB (0 g Intravenous Stopped 04/21/22 0133)  azithromycin (ZITHROMAX) 500 mg in sodium chloride 0.9 % 250 mL IVPB (500 mg Intravenous New Bag/Given 04/21/22 0101)  acetaminophen (TYLENOL) tablet 650 mg (has no administration in time range)    Or  acetaminophen (TYLENOL) suppository 650 mg (has no administration in time range)  traZODone (DESYREL) tablet 25 mg (has no administration in time range)  magnesium hydroxide (MILK OF MAGNESIA) suspension 30 mL (has no administration in time range)  ondansetron (ZOFRAN) tablet 4 mg (has no administration in time range)    Or  ondansetron (ZOFRAN) injection 4 mg (has no administration in time range)     IMPRESSION / MDM / [place] / ED COURSE  I reviewed the triage vital signs and the nursing notes.  Differential  diagnosis includes, but is not limited to, pneumonia, pneumothorax, ACS, AKI  {Patient presents with symptoms of an acute illness or injury that is potentially life-threatening.  88 year old female presents to the ED with worsening weakness after recently being diagnosed with RSV, with signs of multifocal pneumonia and sepsis requiring medical admission.  She is tachycardic and tachypneic.  Remains a stable without signs of shock.  Blood work with normal CBC.  Metabolic panel is essentially normal.  Negative troponin and lactic acid.  CXR with multifocal infiltrates concerning for superimposed multifocal pneumonia.  Cultures are drawn and she was started on CAP coverage.  Consulted medicine for admission.      FINAL CLINICAL IMPRESSION(S) / ED  DIAGNOSES   Final diagnoses:  Generalized weakness  Sepsis due to pneumonia [facility])     Rx / DC Orders   ED Discharge Orders     None        Note:  This document was prepared using Dragon voice recognition software and may include unintentional dictation errors.   Vladimir Crofts, MD 04/21/22 [phone]

## 2022-04-21 DIAGNOSIS — J189 Pneumonia, unspecified organism: Secondary | ICD-10-CM

## 2022-04-21 DIAGNOSIS — K219 Gastro-esophageal reflux disease without esophagitis: Secondary | ICD-10-CM

## 2022-04-21 DIAGNOSIS — B338 Other specified viral diseases: Secondary | ICD-10-CM | POA: Diagnosis not present

## 2022-04-21 DIAGNOSIS — J84112 Idiopathic pulmonary fibrosis: Secondary | ICD-10-CM | POA: Diagnosis not present

## 2022-04-21 DIAGNOSIS — E039 Hypothyroidism, unspecified: Secondary | ICD-10-CM | POA: Diagnosis not present

## 2022-04-21 DIAGNOSIS — J121 Respiratory syncytial virus pneumonia: Secondary | ICD-10-CM

## 2022-04-21 DIAGNOSIS — A419 Sepsis, unspecified organism: Secondary | ICD-10-CM

## 2022-04-21 LAB — LACTIC ACID, PLASMA
Lactic Acid, Venous: 1.2 mmol/L (ref 0.5–1.9)
Lactic Acid, Venous: 1.6 mmol/L (ref 0.5–1.9)

## 2022-04-21 LAB — CBC
HCT: 36.5 % (ref 36.0–46.0)
Hemoglobin: 11.5 g/dL — ABNORMAL LOW (ref 12.0–15.0)
MCH: 26.3 pg (ref 26.0–34.0)
MCHC: 31.5 g/dL (ref 30.0–36.0)
MCV: 83.5 fL (ref 80.0–100.0)
Platelets: 236 10*3/uL (ref 150–400)
RBC: 4.37 MIL/uL (ref 3.87–5.11)
RDW: 13.7 % (ref 11.5–15.5)
WBC: 3.5 10*3/uL — ABNORMAL LOW (ref 4.0–10.5)
nRBC: 0 % (ref 0.0–0.2)

## 2022-04-21 LAB — PROTIME-INR
INR: 1.2 (ref 0.8–1.2)
INR: 1.2 (ref 0.8–1.2)
Prothrombin Time: 14.7 seconds (ref 11.4–15.2)
Prothrombin Time: 15.5 seconds — ABNORMAL HIGH (ref 11.4–15.2)

## 2022-04-21 LAB — MRSA NEXT GEN BY PCR, NASAL: MRSA by PCR Next Gen: NOT DETECTED

## 2022-04-21 LAB — BASIC METABOLIC PANEL
Anion gap: 6 (ref 5–15)
BUN: 18 mg/dL (ref 8–23)
CO2: 26 mmol/L (ref 22–32)
Calcium: 8.5 mg/dL — ABNORMAL LOW (ref 8.9–10.3)
Chloride: 103 mmol/L (ref 98–111)
Creatinine, Ser: 0.51 mg/dL (ref 0.44–1.00)
GFR, Estimated: >60 mL/min (ref >60)
Glucose, Bld: 93 mg/dL (ref 70–99)
Potassium: 3.6 mmol/L (ref 3.5–5.1)
Sodium: 135 mmol/L (ref 135–145)

## 2022-04-21 LAB — CORTISOL-AM, BLOOD: Cortisol - AM: 10.3 ug/dL (ref 6.7–22.6)

## 2022-04-21 LAB — TROPONIN I (HIGH SENSITIVITY): Troponin I (High Sensitivity): 6 ng/L (ref <18)

## 2022-04-21 LAB — PROCALCITONIN: Procalcitonin: <0.1 ng/mL

## 2022-04-21 LAB — APTT: aPTT: 36 seconds (ref 24–36)

## 2022-04-21 MED ORDER — THYROID 30 MG PO TABS: 15.0000 mg | ORAL_TABLET | ORAL | Status: DC

## 2022-04-21 MED ORDER — MIRTAZAPINE 15 MG PO TABS
15.0000 mg | ORAL_TABLET | Freq: Every day | ORAL | Status: DC
  Administered 2022-04-21: 15 mg via ORAL
  Filled 2022-04-21 (×2): qty 1

## 2022-04-21 MED ORDER — BUDESONIDE 0.25 MG/2ML IN SUSP
0.2500 mg | Freq: Two times a day (BID) | RESPIRATORY_TRACT | Status: DC
  Administered 2022-04-21 – 2022-04-23 (×4): 0.25 mg via RESPIRATORY_TRACT
  Filled 2022-04-21 (×4): qty 2

## 2022-04-21 MED ORDER — IPRATROPIUM-ALBUTEROL 0.5-2.5 (3) MG/3ML IN SOLN
3.0000 mL | Freq: Four times a day (QID) | RESPIRATORY_TRACT | Status: DC
  Administered 2022-04-21 (×2): 3 mL via RESPIRATORY_TRACT
  Filled 2022-04-21 (×2): qty 3

## 2022-04-21 MED ORDER — ONDANSETRON HCL 4 MG PO TABS: 4.0000 mg | ORAL_TABLET | Freq: Four times a day (QID) | ORAL | Status: DC | PRN

## 2022-04-21 MED ORDER — MAGNESIUM HYDROXIDE 400 MG/5ML PO SUSP: 30.0000 mL | Freq: Every day | ORAL | Status: DC | PRN

## 2022-04-21 MED ORDER — ENSURE ENLIVE PO LIQD
237.0000 mL | Freq: Two times a day (BID) | ORAL | Status: DC
  Administered 2022-04-22: 237 mL via ORAL

## 2022-04-21 MED ORDER — THYROID 30 MG PO TABS
30.0000 mg | ORAL_TABLET | Freq: Every day | ORAL | Status: DC
  Administered 2022-04-21 – 2022-04-23 (×3): 30 mg via ORAL
  Filled 2022-04-21 (×3): qty 1

## 2022-04-21 MED ORDER — ADULT MULTIVITAMIN W/MINERALS CH
1.0000 | ORAL_TABLET | Freq: Every day | ORAL | Status: DC
  Administered 2022-04-22 – 2022-04-23 (×2): 1 via ORAL
  Filled 2022-04-21 (×2): qty 1

## 2022-04-21 MED ORDER — SODIUM CHLORIDE 0.9 % IV SOLN
2.0000 g | INTRAVENOUS | Status: DC
  Administered 2022-04-21 – 2022-04-22 (×3): 2 g via INTRAVENOUS
  Filled 2022-04-21: qty 20
  Filled 2022-04-21: qty 2
  Filled 2022-04-21: qty 20

## 2022-04-21 MED ORDER — ENOXAPARIN SODIUM 40 MG/0.4ML IJ SOSY
40.0000 mg | PREFILLED_SYRINGE | INTRAMUSCULAR | Status: DC
  Filled 2022-04-21: qty 0.4

## 2022-04-21 MED ORDER — SODIUM CHLORIDE 0.9 % IV SOLN
500.0000 mg | INTRAVENOUS | Status: DC
  Administered 2022-04-21 – 2022-04-23 (×3): 500 mg via INTRAVENOUS
  Filled 2022-04-21 (×2): qty 5
  Filled 2022-04-21: qty 500

## 2022-04-21 MED ORDER — IPRATROPIUM-ALBUTEROL 0.5-2.5 (3) MG/3ML IN SOLN
3.0000 mL | Freq: Four times a day (QID) | RESPIRATORY_TRACT | Status: DC
  Administered 2022-04-22: 3 mL via RESPIRATORY_TRACT
  Filled 2022-04-21: qty 3

## 2022-04-21 MED ORDER — ACETAMINOPHEN 325 MG PO TABS: 650.0000 mg | ORAL_TABLET | Freq: Four times a day (QID) | ORAL | Status: DC | PRN

## 2022-04-21 MED ORDER — ENOXAPARIN SODIUM 30 MG/0.3ML IJ SOSY
30.0000 mg | PREFILLED_SYRINGE | INTRAMUSCULAR | Status: DC
  Filled 2022-04-21 (×2): qty 0.3

## 2022-04-21 MED ORDER — ACETAMINOPHEN 650 MG RE SUPP: 650.0000 mg | Freq: Four times a day (QID) | RECTAL | Status: DC | PRN

## 2022-04-21 MED ORDER — OYSTER SHELL CALCIUM/D3 500-5 MG-MCG PO TABS
1.0000 | ORAL_TABLET | Freq: Every day | ORAL | Status: DC
  Administered 2022-04-21 – 2022-04-23 (×3): 1 via ORAL
  Filled 2022-04-21 (×3): qty 1

## 2022-04-21 MED ORDER — ONDANSETRON HCL 4 MG/2ML IJ SOLN: 4.0000 mg | Freq: Four times a day (QID) | INTRAMUSCULAR | Status: DC | PRN

## 2022-04-21 MED ORDER — TRAZODONE HCL 50 MG PO TABS
25.0000 mg | ORAL_TABLET | Freq: Every evening | ORAL | Status: DC | PRN
  Filled 2022-04-21: qty 1

## 2022-04-21 MED ORDER — IPRATROPIUM-ALBUTEROL 0.5-2.5 (3) MG/3ML IN SOLN: 3.0000 mL | Freq: Three times a day (TID) | RESPIRATORY_TRACT | Status: DC

## 2022-04-21 MED ORDER — POTASSIUM CHLORIDE IN NACL 20-0.9 MEQ/L-% IV SOLN
INTRAVENOUS | Status: DC
  Filled 2022-04-21: qty 1000

## 2022-04-21 NOTE — H&P (Signed)
Shelia Fernandez   PATIENT NAME: Shelia Fernandez    MR#:  [id number]  DATE OF BIRTH:  Mar 31, 1935  DATE OF ADMISSION:  04/20/2022  PRIMARY CARE PHYSICIAN: Derinda Late, MD   Patient is coming from: [facility]  REQUESTING/REFERRING PHYSICIAN: Vladimir Crofts, MD  CHIEF COMPLAINT:   Chief Complaint  Patient presents with   Weakness    HISTORY OF PRESENT ILLNESS:  Shelia Fernandez is a 88 y.o. frail Caucasian female with medical history significant for asthma, COPD, GERD, hypothyroidism, MVP and BPV, who presented to the emergency room with acute onset of generalized weakness as well as worsening dyspnea over the last few days with associated dry cough and occasional wheezing.  She usually uses oxygen at 2 to 3 L/min at night in the last few days as needed during the day.  Her pulse oximetry was 85% on room air at home.  She had a fever 100.4 with chills at home.  No chest pain or palpitations.  No nausea or vomiting.  She has been having diarrhea however no more than her usual chronic diarrhea.  She was having mild right upper quadrant abdominal pain.  No headache or dizziness or blurred vision.  ED Course: Upon presentation to the emergency room, BP was 130/54 with a heart rate of 105 and respiratory rate of 24 and pulse 70 was 95% on 3 L of O2 via nasal cannula.  Labs revealed mild hyponatremia 133 with a potassium of 3.6 and blood glucose of 183.  High sensitive troponin I was 6.  CBC was within normal and PT was 14.7 with INR 1.2. EKG as reviewed by me : EKG showed sinus tachycardia with a rate of 103 with right atrial enlargement Imaging: Portable chest x-ray showed patchy bilateral airspace disease that may reflect pneumonia.  The patient will be admitted to a medical telemetry bed for further evaluation and management PAST MEDICAL HISTORY:   Past Medical History:  Diagnosis Date   Asthma    Benign positional vertigo    s/p ENT consultation with Eply maneuvers 2014.   Shelia Fernandez.   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  Intolerance to bisphosphonates; GI upset.  Took bisphosphonates x 10 years; last bisphosphonates 2000.  s/p L wrist fracture 2001.   Pancreas anomaly, congenital    PONV (postoperative nausea and vomiting)    Thyroid disease    Hypothyroidism    PAST SURGICAL HISTORY:   Past Surgical History:  Procedure Laterality Date   APPENDECTOMY     CATARACT EXTRACTION W/PHACO Right 07/15/2016   Procedure: CATARACT EXTRACTION PHACO AND INTRAOCULAR LENS PLACEMENT ([place]);  Surgeon: Estill Cotta, MD;  Location: [facility] ORS;  Service: Ophthalmology;  Laterality: Right;  [place] 3:03.8 AP% 25.8 CDE 92.59 FLUID PACK LOT # [id number]   CATARACT EXTRACTION W/PHACO Left 10/14/2016   Procedure: CATARACT EXTRACTION PHACO AND INTRAOCULAR LENS PLACEMENT ([facility]);  Surgeon: Estill Cotta, MD;  Location: [facility] ORS;  Service:  Ophthalmology;  Laterality: Left;  [place] 01:34.5 AP% 26.6 CDE 43.55 Fluid pack lot # [id number]   COLONOSCOPY     x 2; failure due to spasms.  Shelia Fernandez. Shelia Fernandez GI.   ENT consult     +BPV; Eply maneuvers wtih improvement.  Shelia Fernandez/ENT.   FRACTURE SURGERY     R hip fracture s/p ORIF; L hip fracture s/p ORIF.   LAPAROSCOPIC SALPINGO OOPHERECTOMY     TONSILLECTOMY     TUBAL LIGATION      SOCIAL HISTORY:   Social History   Tobacco Use   Smoking status: Passive Smoke Exposure - Never Smoker   Smokeless tobacco: Never   Tobacco comments:    pt's father and husband both smoked in house.   Substance Use Topics   Alcohol  use: No    Alcohol/week: 0.0 standard drinks of alcohol    FAMILY HISTORY:   Family History  Problem Relation Age of Onset   Cancer Sister        Terminal cancer in 2017; Breast cancer; endometrial cancer; vaginal cancer   COPD Sister    Breast cancer Sister    Cancer Sister        Breast cancer   Breast cancer Sister    Cancer Mother 12       cervical cancer   Asthma Mother    COPD Father    Cancer Sister        Melanoma    DRUG ALLERGIES:   Allergies  Allergen Reactions   Sulfa Antibiotics Hives and Rash   Biaxin [Clarithromycin] Other (See Comments)    Had a strong a reaction to it (can't remember specifically).Patiet thinks the dose was too strong for her body weight   Gluten Meal     Stomach upset   Lactose Intolerance (Gi) Nausea And Vomiting   Penicillins Hives and Rash   Sudafed [Pseudoephedrine Hcl]     Can't recall reaction    REVIEW OF SYSTEMS:   ROS As per history of present illness. All pertinent systems were reviewed above. Constitutional, HEENT, cardiovascular, respiratory, GI, GU, musculoskeletal, neuro, psychiatric, endocrine, integumentary and hematologic systems were reviewed and are otherwise negative/unremarkable except for positive findings mentioned above in the HPI.   MEDICATIONS AT HOME:   Prior to Admission medications   Medication Sig Start Date End Date Taking? Authorizing Provider  ARMOUR THYROID 30 MG tablet TAKE ONE TABLET BY MOUTH DAILY Patient taking differently: TAKE ONE TABLET BY MOUTH DAILY (TAKE WITH A 15 MG TABLET ON MONDAY [facility] AND Friday) 06/25/17   Wardell Honour, MD  Calcium Citrate-Vitamin D (CALCIUM CITRATE + D PO) Take 1 tablet by mouth daily.    [provider]  Ipratropium-Albuterol (COMBIVENT RESPIMAT) 20-100 MCG/ACT AERS respimat Inhale 1 puff into the lungs every 6 (six) hours. 02/21/19   Shelia Pita, MD  thyroid [facility] THYROID) 15 MG tablet Take 1 tablet (15 mg total) by mouth every other  day. Patient taking differently: 15 mg. TAKE ONE TABLET BY MOUTH DAILY (TAKE WITH A 30 MG TABLET ON MONDAY [facility] AND FRIDAY 07/28/17   Wardell Honour, MD      VITAL SIGNS:  Blood pressure (!) 116/46, pulse 87, temperature 98.5 F (36.9 C), temperature source Oral, resp. rate (!) 26, height '5\' 1"'$  (1.549 m), weight 41 kg, SpO2 94 %.  PHYSICAL EXAMINATION:  Physical Exam  GENERAL:  88 y.o.-year-old frail and cachectic Caucasian female patient lying in the bed with mild respiratory distress with  conversational dyspnea EYES: Pupils equal, round, reactive to light and accommodation. No scleral icterus. Extraocular muscles intact.  HEENT: Head atraumatic, normocephalic. Oropharynx and nasopharynx clear.  NECK:  Supple, no jugular venous distention. No thyroid enlargement, no tenderness.  LUNGS: Diminished bibasilar breath sounds with bibasal crackles.  No use of accessory muscles of respiration.  CARDIOVASCULAR: Regular rate and rhythm, S1, S2 normal. No murmurs, rubs, or gallops.  ABDOMEN: Soft, nondistended, nontender. Bowel sounds present. No organomegaly or mass.  EXTREMITIES: No pedal edema, cyanosis, or clubbing.  NEUROLOGIC: Cranial nerves II through XII are intact. Muscle strength 5/5 in all extremities. Sensation intact. Gait not checked.  PSYCHIATRIC: The patient is alert and oriented x 3.  Normal affect and good eye contact. SKIN: No obvious rash, lesion, or ulcer.   LABORATORY PANEL:   CBC Recent Labs  Lab 04/20/22 2111  WBC 4.2  HGB 12.0  HCT 37.1  PLT 254   ------------------------------------------------------------------------------------------------------------------  Chemistries  Recent Labs  Lab 04/20/22 2111  NA 133*  K 3.6  CL 99  CO2 25  GLUCOSE 183*  BUN 21  CREATININE 0.54  CALCIUM 9.1   ------------------------------------------------------------------------------------------------------------------  Cardiac Enzymes No results for input(s):  "TROPONINI" in the last 168 hours. ------------------------------------------------------------------------------------------------------------------  RADIOLOGY:  DG Chest Port 1 View  Result Date: 04/20/2022 CLINICAL DATA:  Shortness of breath EXAM: PORTABLE CHEST 1 VIEW COMPARISON:  08/30/2020 FINDINGS: There is hyperinflation of the lungs compatible with COPD. Patchy bilateral airspace opacities. Heart is normal size. Aortic calcifications. No effusions or acute bony abnormality. IMPRESSION: COPD. Patchy bilateral airspace disease may reflect pneumonia. Electronically Signed   By: Rolm Baptise M.D.   On: 04/20/2022 22:13      IMPRESSION AND PLAN:  Assessment and Plan: Sepsis due to pneumonia [facility]) - The patient will be admitted to a medical telemetry bed. - We will continue medical therapy with IV Rocephin and Zithromax. - We will place her on mucolytics and bronchodilator therapy. - We will follow blood cultures.  RSV infection - She will be placed on IV hydration and conservative management.  IPF (idiopathic pulmonary fibrosis) (HCC) - We will continue Pulmicort and DuoNebs.  Hypothyroidism - We will continue Armour Thyroid.  GERD without esophagitis - We will continue PPI therapy.   DVT prophylaxis: Lovenox.  Advanced Care Planning:  Code Status: full code.  Family Communication:  The plan of care was discussed in details with the patient (and family). I answered all questions. The patient agreed to proceed with the above mentioned plan. Further management will depend upon hospital course. Disposition Plan: Back to previous home environment Consults called: none.  All the records are reviewed and case discussed with ED provider.  Status is: Inpatient   At the time of the admission, it appears that the appropriate admission status for this patient is inpatient.  This is judged to be reasonable and necessary in order to provide the required intensity of service to ensure  the patient's safety given the presenting symptoms, physical exam findings and initial radiographic and laboratory data in the context of comorbid conditions.  The patient requires inpatient status due to high intensity of service, high risk of further deterioration and high frequency of surveillance required.  I certify that at the time of admission, it is my clinical judgment that the patient will require inpatient hospital care extending more than 2 midnights.  Dispo: The patient is from: [facility]              Anticipated d/c is to: [facility]              Patient currently is not medically stable to d/c.              Difficult to place patient: No  Christel Mormon M.D on 04/21/2022 at 12:55 AM  [facility]   From 7 PM-7 AM, contact night-coverage [email or web address]  CC: Primary care physician; Derinda Late, MD

## 2022-04-21 NOTE — ED Notes (Addendum)
Pt placed on bedpan at this time per her request.

## 2022-04-21 NOTE — Assessment & Plan Note (Signed)
-  She will be placed on IV hydration and conservative management.

## 2022-04-21 NOTE — Assessment & Plan Note (Signed)
-  We will continue Pulmicort and DuoNebs.

## 2022-04-21 NOTE — Assessment & Plan Note (Addendum)
-  The patient will be admitted to a medical telemetry bed. - Sepsis is manifested by tachycardia and tachypnea - We will continue medical therapy with IV Rocephin and Zithromax. - We will place her on mucolytics and bronchodilator therapy. - We will follow blood cultures.

## 2022-04-21 NOTE — Progress Notes (Signed)
  PROGRESS NOTE    Shelia Fernandez  GZF:[id number] DOB: 1935-01-20 DOA: 04/20/2022 PCP: Derinda Late, MD  213A/213A-AA  LOS: 0 days   Brief hospital course:   Assessment & Plan: Shelia Fernandez is a 88 y.o. frail Caucasian female with medical history significant for asthma, COPD, GERD, hypothyroidism, MVP and BPV, who presented to the emergency room with acute onset of generalized weakness as well as worsening dyspnea over the last few days with associated dry cough and occasional wheezing.  She usually uses oxygen at 2 to 3 L/min at night in the last few days as needed during the day.  Her pulse oximetry was 85% on room air at home.    Sepsis, ruled out --no strong evidence of bacterial PNA.  However, given pt has pulm fibrosis and on chronic ppx abx (Levaquin MWF), will continue empiric abx started on presentation.  RSV infection - supportive care --cont empiric abx with ceftriaxone and azithromycin  Acute on chronic hypoxemic respiratory failure 2-3L at night at baseline --per ED triage note, 87% on ra and staff placed on 3L --Continue supplemental O2 to keep sats >=90%, wean as tolerated  IPF (idiopathic pulmonary fibrosis) ([place]) - continue home Pulmicort and DuoNebs.  Hypothyroidism - continue Armour Thyroid.  GERD without esophagitis - continue PPI therapy.   DVT prophylaxis: Lovenox SQ Code Status: Full code  Family Communication: daughter updated at bedside today Level of care: Telemetry Medical Dispo:   The patient is from: home Anticipated d/c is to: home Anticipated d/c date is: 1-2 days   Subjective and Interval History:  Pt reported feeling better.   Objective: Vitals:   04/21/22 1000 04/21/22 1133 04/21/22 1352 04/21/22 1747  BP: (!) 100/45 112/61    Pulse: 82 84    Resp: (!) 25 19    Temp:  98.2 F (36.8 C)    TempSrc:  Oral    SpO2: 100% 97% 98% 96%  Weight:      Height:       No intake or output data in the 24 hours ending 04/21/22  1818 Filed Weights   04/20/22 2108  Weight: 41 kg    Examination:   Constitutional: NAD, AAOx3 HEENT: conjunctivae and lids normal, EOMI CV: No cyanosis.   RESP: shallow breaths, reduced lung sounds, no wheezing, on 2.5L Neuro: II - XII grossly intact.   Psych: flat mood and affect.  Appropriate judgement and reason   Data Reviewed: I have personally reviewed labs and imaging studies  Time spent: 50 minutes  Shelia Bi, MD [facility] If 7PM-7AM, please contact night-coverage 04/21/2022, 6:18 PM

## 2022-04-21 NOTE — ED Notes (Signed)
Informed RN bed assigned 

## 2022-04-21 NOTE — ED Notes (Addendum)
The report given to [place]:  87/F  PNA, RSV+ - Weakness, SOB - RSV +  - Requires 3L [place] (Normally RA) - Hx: COPD - X-ray shows Pneumonia  - A&Ox4

## 2022-04-21 NOTE — Assessment & Plan Note (Signed)
-  We will continue Armour Thyroid.

## 2022-04-21 NOTE — Assessment & Plan Note (Signed)
-  We will continue PPI therapy.

## 2022-04-21 NOTE — Progress Notes (Signed)
PHARMACIST - PHYSICIAN COMMUNICATION  CONCERNING:  Enoxaparin (Lovenox) for DVT Prophylaxis   RECOMMENDATION: Patient was prescribed enoxaparin '40mg'$  q24 hours for VTE prophylaxis.   Filed Weights   04/20/22 2108  Weight: 41 kg (90 lb 6.2 oz)    Body mass index is 17.08 kg/m.  Estimated Creatinine Clearance: 33.3 mL/min (by C-G formula based on SCr of 0.51 mg/dL).  Patient is candidate for enoxaparin '30mg'$  every 24 hours based on CrCl <54m/min or Weight <45kg  DESCRIPTION: [facility]  Patient is now receiving enoxaparin 30 mg every 24 hours   ABenita Gutter1/16/2024 4:47 PM

## 2022-04-21 NOTE — ED Notes (Signed)
Pt was given a breakfast tray at this time. Containers were opened.

## 2022-04-21 NOTE — ED Notes (Signed)
Pt removed from bed pan, stated "it was just gas" no pericare required.  Pt purewick repositioned and pt pulled up in bed. Pt warm, dry and denies further needs at this time.

## 2022-04-22 DIAGNOSIS — J121 Respiratory syncytial virus pneumonia: Secondary | ICD-10-CM | POA: Diagnosis not present

## 2022-04-22 LAB — BASIC METABOLIC PANEL
Anion gap: 7 (ref 5–15)
BUN: 12 mg/dL (ref 8–23)
CO2: 24 mmol/L (ref 22–32)
Calcium: 8.5 mg/dL — ABNORMAL LOW (ref 8.9–10.3)
Chloride: 105 mmol/L (ref 98–111)
Creatinine, Ser: 0.43 mg/dL — ABNORMAL LOW (ref 0.44–1.00)
GFR, Estimated: >60 mL/min (ref >60)
Glucose, Bld: 99 mg/dL (ref 70–99)
Potassium: 3.6 mmol/L (ref 3.5–5.1)
Sodium: 136 mmol/L (ref 135–145)

## 2022-04-22 LAB — CBC
HCT: 34.8 % — ABNORMAL LOW (ref 36.0–46.0)
Hemoglobin: 10.8 g/dL — ABNORMAL LOW (ref 12.0–15.0)
MCH: 26 pg (ref 26.0–34.0)
MCHC: 31 g/dL (ref 30.0–36.0)
MCV: 83.9 fL (ref 80.0–100.0)
Platelets: 231 10*3/uL (ref 150–400)
RBC: 4.15 MIL/uL (ref 3.87–5.11)
RDW: 13.7 % (ref 11.5–15.5)
WBC: 3.8 10*3/uL — ABNORMAL LOW (ref 4.0–10.5)
nRBC: 0 % (ref 0.0–0.2)

## 2022-04-22 LAB — MAGNESIUM: Magnesium: 1.8 mg/dL (ref 1.7–2.4)

## 2022-04-22 MED ORDER — IPRATROPIUM-ALBUTEROL 0.5-2.5 (3) MG/3ML IN SOLN
3.0000 mL | Freq: Three times a day (TID) | RESPIRATORY_TRACT | Status: DC
  Administered 2022-04-22 – 2022-04-23 (×4): 3 mL via RESPIRATORY_TRACT
  Filled 2022-04-22 (×4): qty 3

## 2022-04-22 MED ORDER — LOPERAMIDE HCL 2 MG PO CAPS
2.0000 mg | ORAL_CAPSULE | Freq: Once | ORAL | Status: AC
  Administered 2022-04-22: 2 mg via ORAL
  Filled 2022-04-22: qty 1

## 2022-04-22 NOTE — TOC Initial Note (Addendum)
Transition of Care [facility]) - Initial/Assessment Note    Patient Details  Name: Shelia Fernandez MRN: [id number] Date of Birth: [birth year]  [facility]) CM/SW Contact:    Candie Chroman, LCSW Phone Number: 04/22/2022, 12:48 PM  Clinical Narrative:  Per chart review, patient is from [facility]. CSW called Deatra Ina, RN to confirm. She stated patient was not receiving home health or using DME to get around at the facility prior to admission. She does have an oxygen concentrator and things it is supplied through [employer]. [employer] liaison is confirming.            2:58 pm: Patient gets her oxygen through [employer]. Notified liaison that patient qualifies for continuous oxygen. MD recommending HHPT. Facility will talk to her daughter who is a Therapist, sports to see if she has an agency preference. They will check transport availability for tomorrow to see if they can transport her back to the facility once discharged.       4:27 pm: Received call from [place] at [facility]. Daughter will transport back to the facility tomorrow. Daughter's preferred home health agency is [facility]. Referral accepted.  Expected Discharge Plan: Assisted Living Barriers to Discharge: Continued Medical Work up   Patient Goals and CMS Choice            Expected Discharge Plan and Services     Post Acute Care Choice: NA Living arrangements for the past 2 months: [place]                                      Prior Living Arrangements/Services Living arrangements for the past 2 months: [place] Lives with:: Facility Resident Patient language and need for interpreter reviewed:: Yes Do you feel safe going back to the place where you live?: Yes      Need for Family Participation in Patient Care: Yes (Comment) Care giver support system in place?: Yes (comment) Current home services: DME Criminal Activity/Legal Involvement Pertinent to Current Situation/Hospitalization: No -  Comment as needed  Activities of Daily Living Home Assistive Devices/Equipment: Gilford Rile (specify type) ADL Screening (condition at time of admission) Patient's cognitive ability adequate to safely complete daily activities?: Yes Is the patient deaf or have difficulty hearing?: No Does the patient have difficulty seeing, even when wearing glasses/contacts?: No Does the patient have difficulty concentrating, remembering, or making decisions?: No Patient able to express need for assistance with ADLs?: Yes Does the patient have difficulty dressing or bathing?: No Independently performs ADLs?: Yes (appropriate for developmental age) Does the patient have difficulty walking or climbing stairs?: Yes Weakness of Legs: Both Weakness of Arms/Hands: None  Permission Sought/Granted Permission sought to share information with : Investment banker, corporate granted to share info w AGENCY: [facility]        Emotional Assessment       Orientation: : Oriented to Self, Oriented to Place, Oriented to  Time, Oriented to Situation Alcohol / Substance Use: Not Applicable Psych Involvement: No (comment)  Admission diagnosis:  CAP (community acquired pneumonia) [J18.9] Generalized weakness [R53.1] Sepsis due to pneumonia ([place]) [J18.9, A41.9] Patient Active Problem List   Diagnosis Date Noted   CAP (community acquired pneumonia) 04/21/2022   Sepsis due to pneumonia ([place]) 04/21/2022   RSV infection 04/21/2022   GERD without esophagitis 04/21/2022   IPF (idiopathic pulmonary fibrosis) ([place]) 04/21/2022  Acute dyspnea 08/21/2019   Hypoxia 08/21/2019   COPD exacerbation ([place]) 08/21/2019   Lactic acidosis 08/21/2019   Granulomatous interstitial lung disease ([place]) 01/25/2019   Underweight 05/14/2014   Osteoporosis 04/24/2013   COPD (chronic obstructive pulmonary disease) ([place]) 04/24/2013   Hypothyroidism 04/24/2013   Esophageal reflux 04/24/2013   PCP:  Derinda Late,  MD Pharmacy:   [facility] [id number] [address], [place] - [place] [address] [place] [address] Phone: [phone] Fax: [phone]  [place], [place] - [address] [place] [address] Phone: [phone] Fax: [phone]     Social Determinants of Health (SDOH) Social History: [place]: No Food Insecurity (04/21/2022)  Housing: Low Risk  (04/21/2022)  Transportation Needs: No Transportation Needs (04/21/2022)  Utilities: Not At Risk (04/21/2022)  Financial Resource Strain: Low Risk  (06/29/2017)  Physical Activity: Sufficiently Active (06/29/2017)  Social Connections: Moderately Integrated (06/29/2017)  Stress: No Stress Concern Present (06/29/2017)  Tobacco Use: Medium Risk (04/20/2022)   SDOH Interventions:     Readmission Risk Interventions     No data to display

## 2022-04-22 NOTE — Progress Notes (Addendum)
SATURATION QUALIFICATIONS: (This note is used to comply with regulatory documentation for home oxygen)  Patient Saturations on Room Air at Rest = 90%  Patient Saturations on Room Air while Ambulating = 87%  Patient Saturations on 2 Liters of oxygen while Ambulating = 90%  Please briefly explain why patient needs home oxygen:   Note prepared by Clent Jacks, RN  Cosigned by Enzo Bi, MD

## 2022-04-22 NOTE — Progress Notes (Signed)
  PROGRESS NOTE    JUDYE Fernandez  ZCH:[id number] DOB: [birth year] DOA: 04/20/2022 PCP: Derinda Late, MD  213A/213A-AA  LOS: 1 day   Brief hospital course:   Assessment & Plan: Shelia Fernandez is a 88 y.o. frail Caucasian female with medical history significant for asthma, COPD, GERD, hypothyroidism, MVP and BPV, who presented to the emergency room with acute onset of generalized weakness as well as worsening dyspnea over the last few days with associated dry cough and occasional wheezing.  She usually uses oxygen at 2 to 3 L/min at night in the last few days as needed during the day.  Her pulse oximetry was 85% on room air at home.    Sepsis, ruled out --no strong evidence of bacterial PNA.  However, given pt has pulm fibrosis and on chronic ppx abx (Levaquin MWF), will continue empiric abx started on presentation.  RSV infection - supportive care --cont empiric abx with ceftriaxone and azithromycin  Acute on chronic hypoxemic respiratory failure COPD on 2-3L at night at baseline --per ED triage note, 87% on ra and staff placed on 3L --walk test today found pt desat to 87% and needed 2L O2. --Continue supplemental O2 to keep sats >=90%, wean as tolerated  IPF (idiopathic pulmonary fibrosis) ([place]) - continue home Pulmicort and DuoNebs.  Hypothyroidism - continue Armour Thyroid.  GERD without esophagitis - continue PPI therapy.   DVT prophylaxis: Lovenox SQ Code Status: Full code  Family Communication: daughter updated on the phone today Level of care: Telemetry Medical Dispo:   The patient is from: home Anticipated d/c is to: home Anticipated d/c date is: tomorrow   Subjective and Interval History:  Pt reported dyspnea on exertion.  RN walked pt and found pt desat to 87% on room air.     Objective: Vitals:   04/22/22 0734 04/22/22 0736 04/22/22 1352 04/22/22 1523  BP: (!) 117/58   (!) 109/56  Pulse: 79   89  Resp: 16   16  Temp: 98.2 F (36.8 C)   98.9 F  (37.2 C)  TempSrc:      SpO2: 98% 98% 98% 94%  Weight:      Height:       No intake or output data in the 24 hours ending 04/22/22 1905 Filed Weights   04/20/22 2108  Weight: 41 kg    Examination:   Constitutional: NAD, AAOx3 HEENT: conjunctivae and lids normal, EOMI CV: No cyanosis.   RESP: normal respiratory effort, on 2L Neuro: II - XII grossly intact.   Psych: flat mood and affect.     Data Reviewed: I have personally reviewed labs and imaging studies  Time spent: 35 minutes  Enzo Bi, MD [facility] If 7PM-7AM, please contact night-coverage 04/22/2022, 7:05 PM

## 2022-04-23 DIAGNOSIS — A419 Sepsis, unspecified organism: Secondary | ICD-10-CM | POA: Diagnosis present

## 2022-04-23 DIAGNOSIS — J121 Respiratory syncytial virus pneumonia: Secondary | ICD-10-CM | POA: Diagnosis not present

## 2022-04-23 MED ORDER — ADULT MULTIVITAMIN W/MINERALS CH: 1.0000 | ORAL_TABLET | Freq: Every day | ORAL | Status: DC

## 2022-04-23 MED ORDER — DOXYCYCLINE MONOHYDRATE 100 MG PO TABS: 100.0000 mg | ORAL_TABLET | Freq: Two times a day (BID) | ORAL | 0 refills | Status: AC

## 2022-04-23 NOTE — Discharge Summary (Addendum)
Physician Discharge Summary   Shelia Fernandez  female DOB: 06-29-1934  ZOX:[id number]  PCP: Derinda Late, MD  Admit date: 04/20/2022 Discharge date: 04/23/2022  Admitted From: ALF Disposition:  ALF Discussed with daughter about discharge plans prior to discharge. Home Health: Yes CODE STATUS: Full code   Hospital Course:  For full details, please see H&P, progress notes, consult notes and ancillary notes.  Briefly,  Shelia Fernandez is a 88 y.o. frail Caucasian female with medical history significant for asthma, advanced COPD on 2-3L at night, hypothyroidism, MVP and BPV, who presented to the emergency room with acute onset of generalized weakness as well as worsening dyspnea over the last few days with associated dry cough and occasional wheezing.  Her pulse oximetry was 85% on room air at home.    RSV infection -supportive care --received 3 days of empiric abx with ceftriaxone and azithromycin.  Pt can finish the course of doxycycline prescribed as outpatient PTA, and then return to her chronic Levaquin MWF.   Acute on chronic hypoxemic respiratory failure Advanced COPD on 2-3L at night at baseline --per ED triage note, 87% on RA and staff placed on 3L --walk test on 1/17 found pt desat to 87% and needed 2L O2. --pt had covid back in Nov 2023, and now RSV, which contributed to her respiratory decline in the setting of advanced COPD.  Pt is discharged on 2L home O2.  Pt will continue f/u with pulm Dr. Lanney Gins. - continue home Pulmicort and DuoNebs.   Hypothyroidism - continue Armour Thyroid.   GERD without esophagitis - continue PPI therapy.  Sepsis, ruled out --no strong evidence of bacterial PNA.  However, given pt has advanced COPD and on chronic ppx abx (Levaquin MWF), pt received 3 days of empiric ceftriaxone and azithromycin.   Discharge Diagnoses:  Principal Problem:   RSV infection Active Problems:   IPF (idiopathic pulmonary fibrosis) (HCC)    Hypothyroidism   GERD without esophagitis   COPD (chronic obstructive pulmonary disease) ([place])   30 Day Unplanned Readmission Risk Score    Flowsheet Row ED to Hosp-Admission (Current) from 04/20/2022 in [facility]  30 Day Unplanned Readmission Risk Score (%) 13.15 Filed at 04/23/2022 0400       This score is the patient's risk of an unplanned readmission within 30 days of being discharged (0 -100%). The score is based on dignosis, age, lab data, medications, orders, and past utilization.   Low:  0-14.9   Medium: 15-21.9   High: 22-29.9   Extreme: 30 and above         Discharge Instructions:  Allergies as of 04/23/2022       Reactions   Sulfa Antibiotics Hives, Rash   Biaxin [clarithromycin] Other (See Comments)   Had a strong a reaction to it (can't remember specifically).Patiet thinks the dose was too strong for her body weight   Lactose Intolerance (gi) Nausea And Vomiting   Penicillins Hives, Rash   Sudafed [pseudoephedrine Hcl]    Can't recall reaction        Medication List     STOP taking these medications    diclofenac Sodium 1 % Gel Commonly known as: VOLTAREN   loperamide 2 MG tablet Commonly known as: IMODIUM A-D       TAKE these medications    acetaminophen 500 MG tablet Commonly known as: TYLENOL Take 500 mg by mouth every 6 (six) hours as needed.   albuterol 108 (90 Base)  MCG/ACT inhaler Commonly known as: VENTOLIN HFA Inhale 1-2 puffs into the lungs every 6 (six) hours as needed for wheezing or shortness of breath.   Armour Thyroid 30 MG tablet Generic drug: thyroid TAKE ONE TABLET BY MOUTH DAILY What changed:  how much to take when to take this Another medication with the same name was removed. Continue taking this medication, and follow the directions you see here.   b-complex w/C Tabs tablet Take 1 tablet by mouth daily.   budesonide 0.25 MG/2ML nebulizer solution Commonly known as:  PULMICORT Take 0.25 mg by nebulization 2 (two) times daily.   CALCIUM CITRATE + D PO Take 1 tablet by mouth daily.   Combivent Respimat 20-100 MCG/ACT Aers respimat Generic drug: Ipratropium-Albuterol Inhale 1 puff into the lungs every 6 (six) hours.   doxycycline 100 MG tablet Commonly known as: ADOXA Take 1 tablet (100 mg total) by mouth 2 (two) times daily for 5 days. End date 04/28/22, as originally prescribed as outpatient. What changed: additional instructions   levofloxacin 250 MG tablet Commonly known as: LEVAQUIN Take 250 mg by mouth every Monday, Wednesday, and Friday.   melatonin 5 MG Tabs Take 5 mg by mouth at bedtime.   mirtazapine 15 MG tablet Commonly known as: REMERON Take 15 mg by mouth at bedtime.   multivitamin with minerals Tabs tablet Take 1 tablet by mouth daily.   zinc oxide 20 % ointment Apply 1 Application topically as needed for irritation.               Durable Medical Equipment  (From admission, onward)           Start     Ordered   04/23/22 0827  For home use only DME Other see comment  Once       Comments: POC eval  Question:  Length of Need  Answer:  6 Months   04/23/22 0826   04/22/22 1451  For home use only DME oxygen  Once       Question Answer Comment  Length of Need 6 Months   Mode or (Route) Nasal cannula   Liters per Minute 2   Frequency Continuous (stationary and portable oxygen unit needed)   Oxygen delivery system Gas      04/22/22 1450             Follow-up Information     Ottie Glazier, MD Follow up on 04/30/2022.   Specialty: Pulmonary Disease Why: f/u with already scheduled appointment Contact information: [place] [address] [phone]                 Allergies  Allergen Reactions   Sulfa Antibiotics Hives and Rash   Biaxin [Clarithromycin] Other (See Comments)    Had a strong a reaction to it (can't remember specifically).Patiet thinks the dose was too  strong for her body weight   Lactose Intolerance (Gi) Nausea And Vomiting   Penicillins Hives and Rash   Sudafed [Pseudoephedrine Hcl]     Can't recall reaction     The results of significant diagnostics from this hospitalization (including imaging, microbiology, ancillary and laboratory) are listed below for reference.   Consultations:   Procedures/Studies: DG Chest Port 1 View  Result Date: 04/20/2022 CLINICAL DATA:  Shortness of breath EXAM: PORTABLE CHEST 1 VIEW COMPARISON:  08/30/2020 FINDINGS: There is hyperinflation of the lungs compatible with COPD. Patchy bilateral airspace opacities. Heart is normal size. Aortic calcifications. No effusions or acute bony  abnormality. IMPRESSION: COPD. Patchy bilateral airspace disease may reflect pneumonia. Electronically Signed   By: Rolm Baptise M.D.   On: 04/20/2022 22:13      Labs: BNP (last 3 results) No results for input(s): "BNP" in the last 8760 hours. Basic Metabolic Panel: Recent Labs  Lab 04/20/22 2111 04/21/22 0525 04/22/22 0413  NA 133* 135 136  K 3.6 3.6 3.6  CL 99 103 105  CO2 '25 26 24  '$ GLUCOSE 183* 93 99  BUN '21 18 12  '$ CREATININE 0.54 0.51 0.43*  CALCIUM 9.1 8.5* 8.5*  MG  --   --  1.8   Liver Function Tests: No results for input(s): "AST", "ALT", "ALKPHOS", "BILITOT", "PROT", "ALBUMIN" in the last 168 hours. No results for input(s): "LIPASE", "AMYLASE" in the last 168 hours. No results for input(s): "AMMONIA" in the last 168 hours. CBC: Recent Labs  Lab 04/20/22 2111 04/21/22 0525 04/22/22 0413  WBC 4.2 3.5* 3.8*  HGB 12.0 11.5* 10.8*  HCT 37.1 36.5 34.8*  MCV 82.3 83.5 83.9  PLT 254 236 231   Cardiac Enzymes: No results for input(s): "CKTOTAL", "CKMB", "CKMBINDEX", "TROPONINI" in the last 168 hours. BNP: Invalid input(s): "POCBNP" CBG: No results for input(s): "GLUCAP" in the last 168 hours. D-Dimer No results for input(s): "DDIMER" in the last 72 hours. Hgb A1c No results for input(s):  "HGBA1C" in the last 72 hours. Lipid Profile No results for input(s): "CHOL", "HDL", "LDLCALC", "TRIG", "CHOLHDL", "LDLDIRECT" in the last 72 hours. Thyroid function studies No results for input(s): "TSH", "T4TOTAL", "T3FREE", "THYROIDAB" in the last 72 hours.  Invalid input(s): "FREET3" Anemia work up No results for input(s): "VITAMINB12", "FOLATE", "FERRITIN", "TIBC", "IRON", "RETICCTPCT" in the last 72 hours. Urinalysis    Component Value Date/Time   COLORURINE YELLOW (A) 08/21/2019 1609   APPEARANCEUR HAZY (A) 08/21/2019 1609   APPEARANCEUR Clear 01/08/2013 1911   LABSPEC 1.025 08/21/2019 1609   LABSPEC 1.017 01/08/2013 1911   PHURINE 5.0 08/21/2019 1609   GLUCOSEU >=500 (A) 08/21/2019 1609   GLUCOSEU Negative 01/08/2013 1911   HGBUR MODERATE (A) 08/21/2019 1609   BILIRUBINUR NEGATIVE 08/21/2019 1609   BILIRUBINUR negative 07/28/2017 0836   BILIRUBINUR negative 11/16/2014 1143   BILIRUBINUR Negative 01/08/2013 1911   KETONESUR NEGATIVE 08/21/2019 1609   PROTEINUR NEGATIVE 08/21/2019 1609   UROBILINOGEN 0.2 07/28/2017 0836   UROBILINOGEN 0.2 01/11/2015 1439   NITRITE NEGATIVE 08/21/2019 1609   LEUKOCYTESUR NEGATIVE 08/21/2019 1609   LEUKOCYTESUR Negative 01/08/2013 1911   Sepsis Labs Recent Labs  Lab 04/20/22 2111 04/21/22 0525 04/22/22 0413  WBC 4.2 3.5* 3.8*   Microbiology Recent Results (from the past 240 hour(s))  Blood Culture (routine x 2)     Status: None (Preliminary result)   Collection Time: 04/21/22 12:04 AM   Specimen: BLOOD  Result Value Ref Range Status   Specimen Description BLOOD LEFT ANTECUBITAL  Final   Special Requests   Final    BOTTLES DRAWN AEROBIC AND ANAEROBIC Blood Culture results may not be optimal due to an inadequate volume of blood received in culture bottles   Culture   Final    NO GROWTH 2 DAYS Performed at [facility], [address] [place], [place] [address]    Report Status PENDING  Incomplete  Blood Culture  (routine x 2)     Status: None (Preliminary result)   Collection Time: 04/21/22 12:05 AM   Specimen: BLOOD  Result Value Ref Range Status   Specimen Description BLOOD BLOOD LEFT FOREARM  Final   Special Requests   Final    BOTTLES DRAWN AEROBIC AND ANAEROBIC Blood Culture results may not be optimal due to an inadequate volume of blood received in culture bottles   Culture   Final    NO GROWTH 2 DAYS Performed at [facility], [address], [place], [place] [address]    Report Status PENDING  Incomplete  MRSA Next Gen by PCR, Nasal     Status: None   Collection Time: 04/21/22 12:22 PM   Specimen: Nasal Mucosa; Nasal Swab  Result Value Ref Range Status   MRSA by PCR Next Gen NOT DETECTED NOT DETECTED Final    Comment: (NOTE) The GeneXpert MRSA Assay (FDA approved for NASAL specimens only), is one component of a comprehensive MRSA colonization surveillance program. It is not intended to diagnose MRSA infection nor to guide or monitor treatment for MRSA infections. Test performance is not FDA approved in patients less than 75 years old. Performed at [facility], [address] [place], [place] [address]      Total time spend on discharging this patient, including the last patient exam, discussing the hospital stay, instructions for ongoing care as it relates to all pertinent caregivers, as well as preparing the medical discharge records, prescriptions, and/or referrals as applicable, is 30 minutes.    Enzo Bi, MD  [facility] 04/23/2022, 12:15 PM

## 2022-04-23 NOTE — Plan of Care (Signed)

## 2022-04-23 NOTE — TOC Progression Note (Signed)
Transition of Care [facility]) - Progression Note    Patient Details  Name: Shelia Fernandez MRN: [id number] Date of Birth: [birth year]  Transition of Care [facility]) CM/SW [place], LCSW Phone Number: 04/23/2022, 11:51 AM  Clinical Narrative: Faxed discharge paperwork to [facility]. Deatra Ina, RN will review and call CSW back to notify if any changes need to be made or not.    Expected Discharge Plan: Assisted Living Barriers to Discharge: Continued Medical Work up  Expected Discharge Plan and Services     Post Acute Care Choice: NA Living arrangements for the past 2 months: [place] Expected Discharge Date: 04/23/22                                     Social Determinants of Health (SDOH) Interventions SDOH Screenings   Food Insecurity: No Food Insecurity (04/21/2022)  Housing: Low Risk  (04/21/2022)  Transportation Needs: No Transportation Needs (04/21/2022)  Utilities: Not At Risk (04/21/2022)  Financial Resource Strain: Low Risk  (06/29/2017)  Physical Activity: Sufficiently Active (06/29/2017)  Social Connections: Moderately Integrated (06/29/2017)  Stress: No Stress Concern Present (06/29/2017)  Tobacco Use: Medium Risk (04/20/2022)    Readmission Risk Interventions     No data to display

## 2022-04-23 NOTE — TOC Transition Note (Signed)
Transition of Care [facility]) - CM/SW Discharge Note   Patient Details  Name: Shelia Fernandez MRN: [id number] Date of Birth: 11-27-1934  Transition of Care [facility]) CM/SW Contact:  Candie Chroman, LCSW Phone Number: 04/23/2022, 1:03 PM   Clinical Narrative:   Patient has orders to discharge back to [facility] today. [place] liaison is aware. Deatra Ina, RN has reviewed discharge paperwork. Home set up for oxygen is scheduled for today. No further concerns. CSW signing off.  Final next level of care: Assisted Living (with home health) Barriers to Discharge: Barriers Resolved   Patient Goals and CMS Choice   Choice offered to / list presented to : Adult Children  Discharge Placement                  Patient to be transferred to facility by: Daughter   Patient and family notified of of transfer: 04/23/22  Discharge Plan and Services Additional resources added to the After Visit Summary for       Post Acute Care Choice: NA          DME Arranged: Oxygen DME Agency: [employer] Date DME Agency Contacted: 04/23/22   Representative spoke with at DME Agency: [place] Arranged: PT [facility] Agency: [place] Date [place]: 04/23/22   Representative spoke with at [place]: Sharmon Revere  Social Determinants of Health ([place]) Interventions SDOH Screenings   Food Insecurity: No Food Insecurity (04/21/2022)  Housing: Low Risk  (04/21/2022)  Transportation Needs: No Transportation Needs (04/21/2022)  Utilities: Not At Risk (04/21/2022)  Financial Resource Strain: Low Risk  (06/29/2017)  Physical Activity: Sufficiently Active (06/29/2017)  Social Connections: Moderately Integrated (06/29/2017)  Stress: No Stress Concern Present (06/29/2017)  Tobacco Use: Medium Risk (04/20/2022)     Readmission Risk Interventions     No data to display

## 2022-04-23 NOTE — NC FL2 (Addendum)
[place] LEVEL OF CARE FORM     IDENTIFICATION  Patient Name: Shelia Fernandez Birthdate: Nov 13, 1934 Sex: female Admission Date (Current Location): 04/20/2022  [facility] and [place] Number:  Engineering geologist and Address:  [facility], [address] [place], [place] [address]      Provider Number: [id number]  Attending Physician Name and Address:  Enzo Bi, MD  Relative Name and Phone Number:       Current Level of Care: Hospital Recommended Level of Care: [place] (with PT through [facility]) Prior Approval Number:    Date Approved/Denied:   PASRR Number:    Discharge Plan: Other (Comment) (ALF with PT through [facility])    Current Diagnoses: Patient Active Problem List   Diagnosis Date Noted   CAP (community acquired pneumonia) 04/21/2022   Sepsis due to pneumonia ([place]) 04/21/2022   RSV infection 04/21/2022   GERD without esophagitis 04/21/2022   IPF (idiopathic pulmonary fibrosis) ([place]) 04/21/2022   Acute dyspnea 08/21/2019   Hypoxia 08/21/2019   COPD exacerbation ([place]) 08/21/2019   Lactic acidosis 08/21/2019   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) 04/24/2013   Hypothyroidism 04/24/2013   Esophageal reflux 04/24/2013    Orientation RESPIRATION BLADDER Height & Weight     Self, Time, Situation, Place  O2 (Nasal Cannula 2 L) Continent Weight: 90 lb 6.2 oz (41 kg) Height:  '5\' 1"'$  (154.9 cm)  BEHAVIORAL SYMPTOMS/MOOD NEUROLOGICAL BOWEL NUTRITION STATUS   (None)   Continent Diet (Regular)  AMBULATORY STATUS COMMUNICATION OF NEEDS Skin     Verbally Normal                       Personal Care Assistance Level of Assistance              Functional Limitations Info  Sight, Hearing, Speech Sight Info: Adequate Hearing Info: Adequate Speech Info: Adequate    SPECIAL CARE  FACTORS FREQUENCY  PT (By licensed PT)     PT Frequency: 3 x week              Contractures Contractures Info: Not present    Additional Factors Info  Code Status, Allergies Code Status Info: Full code Allergies Info: Sulfa antibiotics, Biaxin (Clarithromycin), Lactose intolerance (gi), Penicillins, Sudafed (Pseudoephedrine Hcl)           Current Medications (04/23/2022):  This is the current hospital active medication list Current Facility-Administered Medications  Medication Dose Route Frequency Provider Last Rate Last Admin   acetaminophen (TYLENOL) tablet 650 mg  650 mg Oral Q6H PRN Mansy, Jan A, MD       Or   acetaminophen (TYLENOL) suppository 650 mg  650 mg Rectal Q6H PRN Mansy, Jan A, MD       azithromycin (ZITHROMAX) 500 mg in sodium chloride 0.9 % 250 mL IVPB  500 mg Intravenous Q24H Mansy, Jan A, MD   Stopped at 04/23/22 0154   budesonide (PULMICORT) nebulizer solution 0.25 mg  0.25 mg Nebulization BID Enzo Bi, MD   0.25 mg at 04/23/22 2876   calcium-vitamin D (OSCAL WITH D) 500-5 MG-MCG per tablet 1 tablet  1 tablet Oral Daily Mansy, Jan A, MD   1 tablet at 04/22/22 0813   cefTRIAXone (ROCEPHIN) 2 g in sodium chloride 0.9 % 100 mL IVPB  2 g Intravenous Q24H Mansy, Jan A, MD 200 mL/hr  at 04/22/22 2346 2 g at 04/22/22 2346   enoxaparin (LOVENOX) injection 30 mg  30 mg Subcutaneous Q24H Benita Gutter, RPH       feeding supplement (ENSURE ENLIVE / ENSURE PLUS) liquid 237 mL  237 mL Oral BID BM Enzo Bi, MD   237 mL at 04/22/22 0818   ipratropium-albuterol (DUONEB) 0.5-2.5 (3) MG/3ML nebulizer solution 3 mL  3 mL Nebulization TID Enzo Bi, MD   3 mL at 04/23/22 0807   magnesium hydroxide (MILK OF MAGNESIA) suspension 30 mL  30 mL Oral Daily PRN Mansy, Jan A, MD       mirtazapine (REMERON) tablet 15 mg  15 mg Oral QHS Mansy, Jan A, MD   15 mg at 04/21/22 2141   multivitamin with minerals tablet 1 tablet  1 tablet Oral Daily Enzo Bi, MD   1 tablet at 04/22/22 0813    ondansetron (ZOFRAN) tablet 4 mg  4 mg Oral Q6H PRN Mansy, Jan A, MD       Or   ondansetron [facility]) injection 4 mg  4 mg Intravenous Q6H PRN Mansy, Jan A, MD       thyroid (ARMOUR) tablet 30 mg  30 mg Oral Daily Mansy, Jan A, MD   30 mg at 04/22/22 0813   traZODone (DESYREL) tablet 25 mg  25 mg Oral QHS PRN Mansy, Arvella Merles, MD         Discharge Medications: STOP taking these medications     diclofenac Sodium 1 % Gel Commonly known as: VOLTAREN    loperamide 2 MG tablet Commonly known as: IMODIUM A-D           TAKE these medications     acetaminophen 500 MG tablet Commonly known as: TYLENOL Take 500 mg by mouth every 6 (six) hours as needed.    albuterol 108 (90 Base) MCG/ACT inhaler Commonly known as: VENTOLIN HFA Inhale 1-2 puffs into the lungs every 6 (six) hours as needed for wheezing or shortness of breath.    Armour Thyroid 30 MG tablet Generic drug: thyroid TAKE ONE TABLET BY MOUTH DAILY What changed:  how much to take when to take this Another medication with the same name was removed. Continue taking this medication, and follow the directions you see here.    b-complex w/C Tabs tablet Take 1 tablet by mouth daily.    budesonide 0.25 MG/2ML nebulizer solution Commonly known as: PULMICORT Take 0.25 mg by nebulization 2 (two) times daily.    CALCIUM CITRATE + D PO Take 1 tablet by mouth daily.    Combivent Respimat 20-100 MCG/ACT Aers respimat Generic drug: Ipratropium-Albuterol Inhale 1 puff into the lungs every 6 (six) hours.    doxycycline 100 MG tablet Commonly known as: ADOXA Take 1 tablet (100 mg total) by mouth 2 (two) times daily for 5 days. End date 04/28/22, as originally prescribed as outpatient. What changed: additional instructions    levofloxacin 250 MG tablet Commonly known as: LEVAQUIN Take 250 mg by mouth every Monday, Wednesday, and Friday.    melatonin 5 MG Tabs Take 5 mg by mouth at bedtime.    mirtazapine 15 MG tablet Commonly  known as: REMERON Take 15 mg by mouth at bedtime.    multivitamin with minerals Tabs tablet Take 1 tablet by mouth daily.    zinc oxide 20 % ointment Apply 1 Application topically as needed for irritation.    Relevant Imaging Results:  Relevant Lab Results:   Additional Information SS#: [id number]  Candie Chroman, LCSW

## 2022-04-23 NOTE — Care Management Obs Status (Signed)
[place] NOTIFICATION   Patient Details  Name: LUWANNA BROSSMAN MRN: [id number] Date of Birth: 30-Dec-1934   Medicare Observation Status Notification Given:  Yes    Candie Chroman, LCSW 04/23/2022, 3:56 PM

## 2022-04-23 NOTE — Care Management CC44 (Signed)
Condition Code 44 Documentation Completed  Patient Details  Name: Shelia Fernandez MRN: [id number] Date of Birth: 1934/07/08   Condition Code 44 given:  Yes Patient signature on Condition Code 44 notice:  Yes Documentation of 2 MD's agreement:  Yes Code 44 added to claim:  Yes    Candie Chroman, LCSW 04/23/2022, 3:56 PM

## 2022-04-26 LAB — CULTURE, BLOOD (ROUTINE X 2)
Culture: NO GROWTH
Culture: NO GROWTH

## 2023-02-05 DEATH — deceased
